# Patient Record
Sex: Female | Born: 1960
Health system: Southern US, Community
[De-identification: ages and names within clinical notes are randomized; demographics above are authoritative.]

## PROBLEM LIST (undated history)

## (undated) DIAGNOSIS — R519 Headache, unspecified: Secondary | ICD-10-CM

## (undated) DIAGNOSIS — K219 Gastro-esophageal reflux disease without esophagitis: Secondary | ICD-10-CM

## (undated) DIAGNOSIS — I10 Essential (primary) hypertension: Secondary | ICD-10-CM

## (undated) DIAGNOSIS — R51 Headache: Secondary | ICD-10-CM

## (undated) DIAGNOSIS — J449 Chronic obstructive pulmonary disease, unspecified: Secondary | ICD-10-CM

## (undated) DIAGNOSIS — E785 Hyperlipidemia, unspecified: Secondary | ICD-10-CM

## (undated) DIAGNOSIS — F32A Depression, unspecified: Secondary | ICD-10-CM

## (undated) DIAGNOSIS — E039 Hypothyroidism, unspecified: Secondary | ICD-10-CM

## (undated) DIAGNOSIS — F419 Anxiety disorder, unspecified: Secondary | ICD-10-CM

## (undated) DIAGNOSIS — F329 Major depressive disorder, single episode, unspecified: Secondary | ICD-10-CM

## (undated) DIAGNOSIS — I8393 Asymptomatic varicose veins of bilateral lower extremities: Secondary | ICD-10-CM

## (undated) DIAGNOSIS — M8580 Other specified disorders of bone density and structure, unspecified site: Secondary | ICD-10-CM

## (undated) DIAGNOSIS — G8929 Other chronic pain: Secondary | ICD-10-CM

## (undated) DIAGNOSIS — R569 Unspecified convulsions: Secondary | ICD-10-CM

## (undated) HISTORY — PX: OTHER SURGICAL HISTORY: SHX169

## (undated) HISTORY — DX: Essential (primary) hypertension: I10

## (undated) HISTORY — PX: ABDOMINAL HYSTERECTOMY: SHX81

## (undated) HISTORY — DX: Hypothyroidism, unspecified: E03.9

## (undated) HISTORY — DX: Hyperlipidemia, unspecified: E78.5

## (undated) HISTORY — DX: Asymptomatic varicose veins of bilateral lower extremities: I83.93

## (undated) HISTORY — PX: TOTAL ABDOMINAL HYSTERECTOMY: SHX209

## (undated) HISTORY — DX: Gastro-esophageal reflux disease without esophagitis: K21.9

## (undated) HISTORY — DX: Other specified disorders of bone density and structure, unspecified site: M85.80

---

## 2003-01-06 ENCOUNTER — Ambulatory Visit (HOSPITAL_BASED_OUTPATIENT_CLINIC_OR_DEPARTMENT_OTHER): Admission: RE | Admit: 2003-01-06 | Discharge: 2003-01-06 | Payer: Self-pay | Admitting: Orthopedic Surgery

## 2003-01-29 ENCOUNTER — Ambulatory Visit (HOSPITAL_BASED_OUTPATIENT_CLINIC_OR_DEPARTMENT_OTHER): Admission: RE | Admit: 2003-01-29 | Discharge: 2003-01-29 | Payer: Self-pay | Admitting: Orthopedic Surgery

## 2003-08-03 ENCOUNTER — Ambulatory Visit (HOSPITAL_COMMUNITY): Admission: RE | Admit: 2003-08-03 | Discharge: 2003-08-03 | Payer: Self-pay | Admitting: Internal Medicine

## 2003-08-03 HISTORY — PX: ESOPHAGOGASTRODUODENOSCOPY: SHX1529

## 2003-08-03 HISTORY — PX: COLONOSCOPY: SHX174

## 2003-09-29 ENCOUNTER — Ambulatory Visit (HOSPITAL_COMMUNITY): Admission: RE | Admit: 2003-09-29 | Discharge: 2003-09-29 | Payer: Self-pay | Admitting: Unknown Physician Specialty

## 2003-11-19 ENCOUNTER — Ambulatory Visit (HOSPITAL_COMMUNITY): Admission: RE | Admit: 2003-11-19 | Discharge: 2003-11-19 | Payer: Self-pay | Admitting: Unknown Physician Specialty

## 2004-05-01 ENCOUNTER — Ambulatory Visit (HOSPITAL_COMMUNITY): Admission: RE | Admit: 2004-05-01 | Discharge: 2004-05-01 | Payer: Self-pay | Admitting: Unknown Physician Specialty

## 2004-05-30 ENCOUNTER — Encounter: Admission: RE | Admit: 2004-05-30 | Discharge: 2004-05-30 | Payer: Self-pay | Admitting: Obstetrics and Gynecology

## 2004-07-11 ENCOUNTER — Ambulatory Visit: Payer: Self-pay | Admitting: Internal Medicine

## 2005-07-24 ENCOUNTER — Encounter: Admission: RE | Admit: 2005-07-24 | Discharge: 2005-07-24 | Payer: Self-pay | Admitting: Orthopaedic Surgery

## 2005-08-02 ENCOUNTER — Encounter: Admission: RE | Admit: 2005-08-02 | Discharge: 2005-08-02 | Payer: Self-pay | Admitting: Obstetrics and Gynecology

## 2005-09-16 ENCOUNTER — Ambulatory Visit (HOSPITAL_COMMUNITY): Admission: RE | Admit: 2005-09-16 | Discharge: 2005-09-16 | Payer: Self-pay | Admitting: Orthopaedic Surgery

## 2006-01-10 ENCOUNTER — Ambulatory Visit (HOSPITAL_COMMUNITY): Admission: RE | Admit: 2006-01-10 | Discharge: 2006-01-10 | Payer: Self-pay | Admitting: Family Medicine

## 2006-02-14 ENCOUNTER — Encounter: Admission: RE | Admit: 2006-02-14 | Discharge: 2006-02-28 | Payer: Self-pay | Admitting: Orthopaedic Surgery

## 2006-08-08 ENCOUNTER — Encounter (INDEPENDENT_AMBULATORY_CARE_PROVIDER_SITE_OTHER): Payer: Self-pay | Admitting: Specialist

## 2006-08-08 ENCOUNTER — Ambulatory Visit (HOSPITAL_COMMUNITY): Admission: RE | Admit: 2006-08-08 | Discharge: 2006-08-09 | Payer: Self-pay | Admitting: Obstetrics and Gynecology

## 2006-09-30 ENCOUNTER — Ambulatory Visit: Payer: Self-pay | Admitting: Family Medicine

## 2006-11-22 ENCOUNTER — Encounter (INDEPENDENT_AMBULATORY_CARE_PROVIDER_SITE_OTHER): Payer: Self-pay | Admitting: Family Medicine

## 2007-02-28 ENCOUNTER — Ambulatory Visit: Payer: Self-pay | Admitting: Physician Assistant

## 2007-03-21 ENCOUNTER — Ambulatory Visit: Payer: Self-pay | Admitting: Family Medicine

## 2007-03-21 DIAGNOSIS — M129 Arthropathy, unspecified: Secondary | ICD-10-CM | POA: Insufficient documentation

## 2007-03-21 DIAGNOSIS — K219 Gastro-esophageal reflux disease without esophagitis: Secondary | ICD-10-CM | POA: Insufficient documentation

## 2007-03-21 DIAGNOSIS — I10 Essential (primary) hypertension: Secondary | ICD-10-CM

## 2007-03-21 DIAGNOSIS — H269 Unspecified cataract: Secondary | ICD-10-CM

## 2007-03-21 DIAGNOSIS — F17200 Nicotine dependence, unspecified, uncomplicated: Secondary | ICD-10-CM

## 2007-03-21 DIAGNOSIS — E785 Hyperlipidemia, unspecified: Secondary | ICD-10-CM | POA: Insufficient documentation

## 2007-03-21 DIAGNOSIS — E669 Obesity, unspecified: Secondary | ICD-10-CM | POA: Insufficient documentation

## 2007-03-21 LAB — CONVERTED CEMR LAB
Cholesterol, target level: 200 mg/dL
HDL goal, serum: 40 mg/dL
LDL Goal: 160 mg/dL

## 2007-03-28 ENCOUNTER — Ambulatory Visit (HOSPITAL_COMMUNITY): Admission: RE | Admit: 2007-03-28 | Discharge: 2007-03-28 | Payer: Self-pay | Admitting: Family Medicine

## 2007-03-28 ENCOUNTER — Ambulatory Visit: Payer: Self-pay | Admitting: Family Medicine

## 2007-03-28 ENCOUNTER — Telehealth (INDEPENDENT_AMBULATORY_CARE_PROVIDER_SITE_OTHER): Payer: Self-pay | Admitting: *Deleted

## 2007-03-28 DIAGNOSIS — D751 Secondary polycythemia: Secondary | ICD-10-CM | POA: Insufficient documentation

## 2007-03-28 DIAGNOSIS — F329 Major depressive disorder, single episode, unspecified: Secondary | ICD-10-CM

## 2007-03-28 DIAGNOSIS — F32A Depression, unspecified: Secondary | ICD-10-CM | POA: Insufficient documentation

## 2007-03-31 ENCOUNTER — Telehealth (INDEPENDENT_AMBULATORY_CARE_PROVIDER_SITE_OTHER): Payer: Self-pay | Admitting: *Deleted

## 2007-03-31 ENCOUNTER — Encounter (INDEPENDENT_AMBULATORY_CARE_PROVIDER_SITE_OTHER): Payer: Self-pay | Admitting: Family Medicine

## 2007-04-03 ENCOUNTER — Encounter (INDEPENDENT_AMBULATORY_CARE_PROVIDER_SITE_OTHER): Payer: Self-pay | Admitting: Family Medicine

## 2007-04-04 ENCOUNTER — Telehealth (INDEPENDENT_AMBULATORY_CARE_PROVIDER_SITE_OTHER): Payer: Self-pay | Admitting: Family Medicine

## 2007-04-14 ENCOUNTER — Ambulatory Visit: Payer: Self-pay | Admitting: Family Medicine

## 2007-04-15 ENCOUNTER — Encounter (INDEPENDENT_AMBULATORY_CARE_PROVIDER_SITE_OTHER): Payer: Self-pay | Admitting: Family Medicine

## 2007-04-25 ENCOUNTER — Encounter (INDEPENDENT_AMBULATORY_CARE_PROVIDER_SITE_OTHER): Payer: Self-pay | Admitting: Family Medicine

## 2007-05-09 ENCOUNTER — Encounter (INDEPENDENT_AMBULATORY_CARE_PROVIDER_SITE_OTHER): Payer: Self-pay | Admitting: Family Medicine

## 2007-05-20 ENCOUNTER — Encounter (INDEPENDENT_AMBULATORY_CARE_PROVIDER_SITE_OTHER): Payer: Self-pay | Admitting: Family Medicine

## 2007-05-22 ENCOUNTER — Encounter (INDEPENDENT_AMBULATORY_CARE_PROVIDER_SITE_OTHER): Payer: Self-pay | Admitting: Family Medicine

## 2007-05-23 ENCOUNTER — Encounter (INDEPENDENT_AMBULATORY_CARE_PROVIDER_SITE_OTHER): Payer: Self-pay | Admitting: Family Medicine

## 2007-05-23 ENCOUNTER — Ambulatory Visit (HOSPITAL_COMMUNITY): Admission: RE | Admit: 2007-05-23 | Discharge: 2007-05-23 | Payer: Self-pay | Admitting: Family Medicine

## 2007-06-09 ENCOUNTER — Telehealth (INDEPENDENT_AMBULATORY_CARE_PROVIDER_SITE_OTHER): Payer: Self-pay | Admitting: Family Medicine

## 2007-06-10 ENCOUNTER — Ambulatory Visit: Payer: Self-pay | Admitting: Family Medicine

## 2007-06-10 LAB — CONVERTED CEMR LAB: Rapid Strep: NEGATIVE

## 2007-06-16 ENCOUNTER — Encounter (INDEPENDENT_AMBULATORY_CARE_PROVIDER_SITE_OTHER): Payer: Self-pay | Admitting: Family Medicine

## 2007-06-25 ENCOUNTER — Ambulatory Visit (HOSPITAL_COMMUNITY): Admission: RE | Admit: 2007-06-25 | Discharge: 2007-06-25 | Payer: Self-pay | Admitting: Pulmonary Disease

## 2007-10-10 ENCOUNTER — Ambulatory Visit: Payer: Self-pay | Admitting: Family Medicine

## 2007-10-10 DIAGNOSIS — J984 Other disorders of lung: Secondary | ICD-10-CM | POA: Insufficient documentation

## 2007-10-12 ENCOUNTER — Emergency Department (HOSPITAL_COMMUNITY): Admission: EM | Admit: 2007-10-12 | Discharge: 2007-10-12 | Payer: Self-pay | Admitting: Family Medicine

## 2007-10-13 ENCOUNTER — Encounter (INDEPENDENT_AMBULATORY_CARE_PROVIDER_SITE_OTHER): Payer: Self-pay | Admitting: Family Medicine

## 2007-10-13 ENCOUNTER — Telehealth (INDEPENDENT_AMBULATORY_CARE_PROVIDER_SITE_OTHER): Payer: Self-pay | Admitting: *Deleted

## 2007-12-03 ENCOUNTER — Encounter (INDEPENDENT_AMBULATORY_CARE_PROVIDER_SITE_OTHER): Payer: Self-pay | Admitting: *Deleted

## 2007-12-03 ENCOUNTER — Ambulatory Visit: Payer: Self-pay | Admitting: Family Medicine

## 2007-12-03 ENCOUNTER — Ambulatory Visit (HOSPITAL_COMMUNITY): Admission: RE | Admit: 2007-12-03 | Discharge: 2007-12-03 | Payer: Self-pay | Admitting: Family Medicine

## 2007-12-04 ENCOUNTER — Telehealth (INDEPENDENT_AMBULATORY_CARE_PROVIDER_SITE_OTHER): Payer: Self-pay | Admitting: *Deleted

## 2007-12-15 ENCOUNTER — Encounter (INDEPENDENT_AMBULATORY_CARE_PROVIDER_SITE_OTHER): Payer: Self-pay | Admitting: Family Medicine

## 2008-01-12 ENCOUNTER — Telehealth (INDEPENDENT_AMBULATORY_CARE_PROVIDER_SITE_OTHER): Payer: Self-pay | Admitting: *Deleted

## 2008-01-13 ENCOUNTER — Ambulatory Visit: Payer: Self-pay | Admitting: Family Medicine

## 2008-01-13 DIAGNOSIS — J301 Allergic rhinitis due to pollen: Secondary | ICD-10-CM

## 2008-01-16 ENCOUNTER — Telehealth (INDEPENDENT_AMBULATORY_CARE_PROVIDER_SITE_OTHER): Payer: Self-pay | Admitting: *Deleted

## 2008-01-21 ENCOUNTER — Telehealth (INDEPENDENT_AMBULATORY_CARE_PROVIDER_SITE_OTHER): Payer: Self-pay | Admitting: *Deleted

## 2008-01-21 ENCOUNTER — Ambulatory Visit: Payer: Self-pay | Admitting: Family Medicine

## 2008-02-10 ENCOUNTER — Ambulatory Visit: Payer: Self-pay | Admitting: Family Medicine

## 2008-02-10 LAB — CONVERTED CEMR LAB
Bilirubin Urine: NEGATIVE
Blood in Urine, dipstick: NEGATIVE
Glucose, Urine, Semiquant: NEGATIVE
Ketones, urine, test strip: NEGATIVE
Nitrite: NEGATIVE
Protein, U semiquant: NEGATIVE
Specific Gravity, Urine: 1.015
Urobilinogen, UA: 0.2
WBC Urine, dipstick: NEGATIVE
pH: 6

## 2008-07-21 ENCOUNTER — Ambulatory Visit: Payer: Self-pay | Admitting: Internal Medicine

## 2008-07-22 ENCOUNTER — Telehealth (INDEPENDENT_AMBULATORY_CARE_PROVIDER_SITE_OTHER): Payer: Self-pay | Admitting: Internal Medicine

## 2008-07-23 ENCOUNTER — Ambulatory Visit: Payer: Self-pay | Admitting: Internal Medicine

## 2008-08-12 ENCOUNTER — Encounter (INDEPENDENT_AMBULATORY_CARE_PROVIDER_SITE_OTHER): Payer: Self-pay | Admitting: Family Medicine

## 2008-08-13 ENCOUNTER — Ambulatory Visit: Payer: Self-pay | Admitting: Internal Medicine

## 2008-08-13 ENCOUNTER — Ambulatory Visit: Payer: Self-pay | Admitting: Family Medicine

## 2008-08-13 DIAGNOSIS — R7309 Other abnormal glucose: Secondary | ICD-10-CM

## 2008-08-13 DIAGNOSIS — E039 Hypothyroidism, unspecified: Secondary | ICD-10-CM

## 2008-08-13 LAB — CONVERTED CEMR LAB
ALT: 28 units/L (ref 0–35)
AST: 19 units/L (ref 0–37)
Albumin: 4.7 g/dL (ref 3.5–5.2)
Alkaline Phosphatase: 93 units/L (ref 39–117)
BUN: 10 mg/dL (ref 6–23)
Basophils Absolute: 0 10*3/uL (ref 0.0–0.1)
Basophils Relative: 0 % (ref 0–1)
CO2: 26 meq/L (ref 19–32)
Calcium: 9.5 mg/dL (ref 8.4–10.5)
Chloride: 98 meq/L (ref 96–112)
Cholesterol: 205 mg/dL — ABNORMAL HIGH (ref 0–200)
Creatinine, Ser: 0.71 mg/dL (ref 0.40–1.20)
Eosinophils Absolute: 0.2 10*3/uL (ref 0.0–0.7)
Eosinophils Relative: 2 % (ref 0–5)
Glucose, Bld: 118 mg/dL — ABNORMAL HIGH (ref 70–99)
Glucose, Bld: 124 mg/dL
HCT: 47.8 % — ABNORMAL HIGH (ref 36.0–46.0)
HDL: 54 mg/dL (ref >39)
Hemoglobin: 16 g/dL — ABNORMAL HIGH (ref 12.0–15.0)
Hgb A1c MFr Bld: 6.1 %
LDL Cholesterol: 132 mg/dL — ABNORMAL HIGH (ref 0–99)
Lymphocytes Relative: 23 % (ref 12–46)
Lymphs Abs: 2.3 10*3/uL (ref 0.7–4.0)
MCHC: 33.5 g/dL (ref 30.0–36.0)
MCV: 99 fL (ref 78.0–100.0)
Monocytes Absolute: 0.6 10*3/uL (ref 0.1–1.0)
Monocytes Relative: 6 % (ref 3–12)
Neutro Abs: 6.6 10*3/uL (ref 1.7–7.7)
Neutrophils Relative %: 68 % (ref 43–77)
Platelets: 291 10*3/uL (ref 150–400)
Potassium: 3.4 meq/L — ABNORMAL LOW (ref 3.5–5.3)
RBC: 4.83 M/uL (ref 3.87–5.11)
RDW: 14.2 % (ref 11.5–15.5)
Sodium: 139 meq/L (ref 135–145)
TSH: 4.976 microintl units/mL — ABNORMAL HIGH (ref 0.350–4.50)
Total Bilirubin: 0.6 mg/dL (ref 0.3–1.2)
Total CHOL/HDL Ratio: 3.8
Total Protein: 7.4 g/dL (ref 6.0–8.3)
Triglycerides: 97 mg/dL (ref <150)
VLDL: 19 mg/dL (ref 0–40)
WBC: 9.6 10*3/uL (ref 4.0–10.5)

## 2008-08-30 ENCOUNTER — Ambulatory Visit (HOSPITAL_COMMUNITY): Admission: RE | Admit: 2008-08-30 | Discharge: 2008-08-30 | Payer: Self-pay | Admitting: Internal Medicine

## 2008-08-30 ENCOUNTER — Encounter: Payer: Self-pay | Admitting: Internal Medicine

## 2008-08-30 ENCOUNTER — Ambulatory Visit: Payer: Self-pay | Admitting: Internal Medicine

## 2008-08-30 HISTORY — PX: COLONOSCOPY: SHX174

## 2008-09-24 ENCOUNTER — Ambulatory Visit: Payer: Self-pay | Admitting: Family Medicine

## 2008-09-24 LAB — CONVERTED CEMR LAB
Blood Glucose, Fasting: 121 mg/dL
Hgb A1c MFr Bld: 6.1 %

## 2008-09-25 ENCOUNTER — Encounter (INDEPENDENT_AMBULATORY_CARE_PROVIDER_SITE_OTHER): Payer: Self-pay | Admitting: Family Medicine

## 2008-09-28 LAB — CONVERTED CEMR LAB
ALT: 23 units/L (ref 0–35)
AST: 15 units/L (ref 0–37)
Albumin: 4 g/dL (ref 3.5–5.2)
Alkaline Phosphatase: 87 units/L (ref 39–117)
BUN: 6 mg/dL (ref 6–23)
CO2: 19 meq/L (ref 19–32)
Calcium: 8.7 mg/dL (ref 8.4–10.5)
Chloride: 104 meq/L (ref 96–112)
Creatinine, Ser: 0.67 mg/dL (ref 0.40–1.20)
Glucose, Bld: 133 mg/dL — ABNORMAL HIGH (ref 70–99)
Potassium: 3.5 meq/L (ref 3.5–5.3)
Sodium: 139 meq/L (ref 135–145)
TSH: 4.432 microintl units/mL (ref 0.350–4.50)
Total Bilirubin: 0.3 mg/dL (ref 0.3–1.2)
Total Protein: 6.7 g/dL (ref 6.0–8.3)

## 2008-11-17 ENCOUNTER — Telehealth (INDEPENDENT_AMBULATORY_CARE_PROVIDER_SITE_OTHER): Payer: Self-pay | Admitting: Family Medicine

## 2009-02-03 ENCOUNTER — Telehealth (INDEPENDENT_AMBULATORY_CARE_PROVIDER_SITE_OTHER): Payer: Self-pay | Admitting: *Deleted

## 2009-02-08 ENCOUNTER — Telehealth (INDEPENDENT_AMBULATORY_CARE_PROVIDER_SITE_OTHER): Payer: Self-pay | Admitting: Family Medicine

## 2009-02-25 ENCOUNTER — Ambulatory Visit: Payer: Self-pay | Admitting: Family Medicine

## 2009-02-25 LAB — CONVERTED CEMR LAB
Bilirubin Urine: NEGATIVE
Blood in Urine, dipstick: NEGATIVE
Glucose, Bld: 86 mg/dL
Glucose, Urine, Semiquant: NEGATIVE
Hgb A1c MFr Bld: 6.1 %
Ketones, urine, test strip: NEGATIVE
Nitrite: NEGATIVE
Protein, U semiquant: NEGATIVE
Specific Gravity, Urine: 1.01
Urobilinogen, UA: 0.2
WBC Urine, dipstick: NEGATIVE
pH: 6.5

## 2009-02-26 ENCOUNTER — Encounter (INDEPENDENT_AMBULATORY_CARE_PROVIDER_SITE_OTHER): Payer: Self-pay | Admitting: Family Medicine

## 2009-02-28 LAB — CONVERTED CEMR LAB
ALT: 31 units/L (ref 0–35)
AST: 26 units/L (ref 0–37)
Albumin: 4.3 g/dL (ref 3.5–5.2)
Alkaline Phosphatase: 87 units/L (ref 39–117)
BUN: 8 mg/dL (ref 6–23)
Basophils Absolute: 0 10*3/uL (ref 0.0–0.1)
Basophils Relative: 0 % (ref 0–1)
CO2: 22 meq/L (ref 19–32)
Calcium: 8.9 mg/dL (ref 8.4–10.5)
Chloride: 105 meq/L (ref 96–112)
Cholesterol: 181 mg/dL (ref 0–200)
Creatinine, Ser: 0.63 mg/dL (ref 0.40–1.20)
Eosinophils Absolute: 0.2 10*3/uL (ref 0.0–0.7)
Eosinophils Relative: 3 % (ref 0–5)
Glucose, Bld: 117 mg/dL — ABNORMAL HIGH (ref 70–99)
HCT: 41.2 % (ref 36.0–46.0)
HDL: 40 mg/dL (ref >39)
Hemoglobin: 13.6 g/dL (ref 12.0–15.0)
LDL Cholesterol: 106 mg/dL — ABNORMAL HIGH (ref 0–99)
Lymphocytes Relative: 26 % (ref 12–46)
Lymphs Abs: 2.3 10*3/uL (ref 0.7–4.0)
MCHC: 33 g/dL (ref 30.0–36.0)
MCV: 95.4 fL (ref 78.0–100.0)
Monocytes Absolute: 0.5 10*3/uL (ref 0.1–1.0)
Monocytes Relative: 5 % (ref 3–12)
Neutro Abs: 5.8 10*3/uL (ref 1.7–7.7)
Neutrophils Relative %: 66 % (ref 43–77)
Platelets: 290 10*3/uL (ref 150–400)
Potassium: 3.9 meq/L (ref 3.5–5.3)
RBC: 4.32 M/uL (ref 3.87–5.11)
RDW: 14.4 % (ref 11.5–15.5)
Sodium: 141 meq/L (ref 135–145)
TSH: 8.233 microintl units/mL — ABNORMAL HIGH (ref 0.350–4.500)
Total Bilirubin: 0.4 mg/dL (ref 0.3–1.2)
Total CHOL/HDL Ratio: 4.5
Total Protein: 6.8 g/dL (ref 6.0–8.3)
Triglycerides: 176 mg/dL — ABNORMAL HIGH (ref <150)
VLDL: 35 mg/dL (ref 0–40)
WBC: 8.8 10*3/uL (ref 4.0–10.5)

## 2009-04-11 ENCOUNTER — Ambulatory Visit: Payer: Self-pay | Admitting: Family Medicine

## 2009-04-11 DIAGNOSIS — R0789 Other chest pain: Secondary | ICD-10-CM

## 2009-04-12 ENCOUNTER — Encounter (INDEPENDENT_AMBULATORY_CARE_PROVIDER_SITE_OTHER): Payer: Self-pay | Admitting: Family Medicine

## 2009-04-12 LAB — CONVERTED CEMR LAB: TSH: 5.361 microintl units/mL — ABNORMAL HIGH (ref 0.350–4.500)

## 2009-04-19 ENCOUNTER — Encounter (INDEPENDENT_AMBULATORY_CARE_PROVIDER_SITE_OTHER): Payer: Self-pay | Admitting: Family Medicine

## 2009-05-03 ENCOUNTER — Telehealth (INDEPENDENT_AMBULATORY_CARE_PROVIDER_SITE_OTHER): Payer: Self-pay | Admitting: *Deleted

## 2009-05-04 ENCOUNTER — Ambulatory Visit: Payer: Self-pay | Admitting: Family Medicine

## 2009-05-04 DIAGNOSIS — M25569 Pain in unspecified knee: Secondary | ICD-10-CM | POA: Insufficient documentation

## 2009-05-05 ENCOUNTER — Ambulatory Visit (HOSPITAL_COMMUNITY): Admission: RE | Admit: 2009-05-05 | Discharge: 2009-05-05 | Payer: Self-pay | Admitting: Family Medicine

## 2009-05-17 ENCOUNTER — Ambulatory Visit: Payer: Self-pay | Admitting: Family Medicine

## 2009-05-17 ENCOUNTER — Ambulatory Visit (HOSPITAL_COMMUNITY): Admission: RE | Admit: 2009-05-17 | Discharge: 2009-05-17 | Payer: Self-pay | Admitting: Family Medicine

## 2009-05-17 DIAGNOSIS — M545 Low back pain: Secondary | ICD-10-CM

## 2009-05-18 ENCOUNTER — Encounter (INDEPENDENT_AMBULATORY_CARE_PROVIDER_SITE_OTHER): Payer: Self-pay | Admitting: Family Medicine

## 2009-05-18 LAB — CONVERTED CEMR LAB: TSH: 1.853 microintl units/mL (ref 0.350–4.500)

## 2009-05-19 ENCOUNTER — Encounter (INDEPENDENT_AMBULATORY_CARE_PROVIDER_SITE_OTHER): Payer: Self-pay | Admitting: Family Medicine

## 2009-05-24 ENCOUNTER — Encounter (INDEPENDENT_AMBULATORY_CARE_PROVIDER_SITE_OTHER): Payer: Self-pay | Admitting: Family Medicine

## 2009-06-21 ENCOUNTER — Ambulatory Visit (HOSPITAL_COMMUNITY): Admission: RE | Admit: 2009-06-21 | Discharge: 2009-06-21 | Payer: Self-pay | Admitting: Orthopedic Surgery

## 2010-10-01 LAB — CONVERTED CEMR LAB: LDL Goal: 130 mg/dL

## 2011-01-16 NOTE — Procedures (Signed)
NAMESHAKOYA, GILMORE               ACCOUNT NO.:  [id number]   MEDICAL RECORD NO.:  [id number]          PATIENT TYPE:  OUT   LOCATION:  RESP                          FACILITY:  [facility]   PHYSICIAN:  Edward L. Juanetta Gosling, M.D.DATE OF BIRTH:  11-Nov-1960   DATE OF PROCEDURE:  05/23/2007  DATE OF DISCHARGE:                            PULMONARY FUNCTION TEST   FINDINGS:  1. Spirometry shows a mild ventilatory defect without evidence of      airflow obstruction.  2. Lung volumes show reduction in total lung capacity of the same      order of magnitude as the ventilatory defect suggesting a primary      restrictive process.  3. DLCO is moderately reduced.  4. Arterial blood gases are normal.  5. There is improvement with inhaled bronchodilator, but it has not      reached a level of significance.      Edward L. Juanetta Gosling, M.D.  Electronically Signed     ELH/MEDQ  D:  05/27/2007  T:  05/27/2007  Job:  [id number]

## 2011-01-16 NOTE — Assessment & Plan Note (Signed)
Elizabeth Brewer, Elizabeth Brewer                CHART#:  [id number]   DATE:  08/13/2008                       DOB:  November 30, 1960   CHIEF COMPLAINT:  Time for high-risk screening colonoscopy, history of  gastroesophageal reflux disease.   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  In 2004, she had a high-risk screening  colonoscopy given her history of colon cancer in her mother in her early  45s.  On August 03, 2003, she was found to have internal hemorrhoids,  otherwise normal rectum and colon.  She also underwent an EGD on the  same day which revealed mild erosive reflux esophagitis.  She has been  on a variety of PPIs over the years including Aciphex and Prevacid.  She  is currently on Nexium 40 mg orally daily with fairly good control of  her reflux symptoms.  She has a couple of episodes of breakthrough weak  leg.  Unfortunately, she has continued to gain weight, has gained  another 11 pounds since we last saw this nice lady in 2005.  She  significantly over her ideal body weight at 218 pounds.  She is 5 feet 4  inches tall.  She does not have any odynophagia or dysphagia.  No  melena.  No rectal bleeding.  No abdominal pain.   PAST MEDICAL HISTORY:  Significant for gastroesophageal reflux disease.   PAST SURGICAL HISTORY:  Hysterectomy, carpal tunnel x2, and C-section  x1.   CURRENT MEDICATIONS:  Nexium 40 mg orally daily, Crestor 20 mg daily,  and Lexapro 10 mg at bedtime.   ALLERGIES:  No known drug allergies.   FAMILY HISTORY:  Mother had colon cancer, but actually died with an MI.  Father died with an MI.  Otherwise, no history of chronic GI or liver  illness.   SOCIAL HISTORY:  The patient is married.  She is employed in a [employer].  She smokes a pack of cigarettes  per day.  No alcohol.  No illicit drugs.   REVIEW OF SYSTEMS:  Has not any problems with chest pain or dyspnea on  exertion.  No fever or chills.  Weight gain as outlined above.   PHYSICAL EXAMINATION:  GENERAL:  A pleasant 50 year old lady resting  comfortably.  VITAL SIGNS:  Weight 218, height 5 feet 4 inches, temperature 98, BP  120/78, and pulse 72.  CHEST:  Lungs are clear to auscultation.  CARDIAC:  Regular rate and rhythm without murmur, gallop, or rub.  ABDOMEN:  Nondistended, obese, positive bowel sounds, soft, nontender  without obvious mass or organomegaly.  EXTREMITIES:  No edema.  RECTAL:  Deferred at the time of colonoscopy.   IMPRESSION:  The patient is a pleasant 50 year old lady with a positive  family history of colorectal cancer in her mother.  It has been a little  over 5 years since she lastly had a colonoscopy.  I have offered this  lady a high-risk screening colonoscopy at this time.  Risks, benefits,  alternatives, and limitations have been reviewed; questions answered.  She is agreeable.  She has long-standing gastroesophageal reflux disease  symptoms fairly well  controlled on Nexium.  She could stand to lose a  significant amount of weight and I have discussed this approach along  with multi-prolonged treatment regimen for gastroesophageal reflux  disease.  I will make further recommendations once colonoscopy has been  performed.       Jonathon Bellows, M.D.  Electronically Signed     RMR/MEDQ  D:  08/13/2008  T:  08/14/2008  Job:  [id number]   cc:   Delaney Meigs, M.D.

## 2011-01-16 NOTE — Op Note (Signed)
NAMECARROLYN, HILMES               ACCOUNT NO.:  [id number]   MEDICAL RECORD NO.:  [id number]          PATIENT TYPE:  AMB   LOCATION:  DAY                           FACILITY:  [facility]   PHYSICIAN:  R. Roetta Sessions, M.D. DATE OF BIRTH:  [birth year]   DATE OF PROCEDURE:  08/30/2008  DATE OF DISCHARGE:                               OPERATIVE REPORT   PROCEDURE PERFORMED:  High-risk screening colonoscopy, colonoscopy and  biopsy.   INDICATIONS FOR PROCEDURE:  A 50 year old lady with a positive family  history of colon cancer in first-degree relatives at relatively young  age.  She is devoid of any lower GI tract symptoms currently.  Colonoscopy is now being done as high-risk screening.  Risks, benefits,  alternatives, and limitations have been reviewed.  Questions answered.  Please see the documentation in the medical record.   PROCEDURE NOTE:  O2 saturation, blood pressure, pulse, and respirations  monitored throughout the entire procedure.   CONSCIOUS SEDATION:  Versed 6 mg IV, Demerol 125 mg IV in divided doses,  Phenergan 12.5 mg __________ conscious sedation.   INSTRUMENT:  Pentax video chip system.   FINDINGS:  Digital rectal exam revealed no abnormalities.  Endoscopic  findings:  The prep was adequate.  Colon:  Colonic mucosa was surveyed  from the rectosigmoid junction through the left transverse, right colon,  appendiceal orifice, ileocecal valve, and cecum.  These structures were  well seen and photographed for the record.  From this level, the scope  was slowly and cautiously withdrawn.  All previously mentioned mucosal  surfaces were again seen.  The patient had few scattered sigmoid  diverticula in the __________ cold biopsied/removed.  The remainder of  the colonic mucosa appeared normal.  The scope was pulled down to the  rectum with examination of rectal mucosa including retroflexed view of  the anal verge, demonstrating minimal internal hemorrhoids __________  polyp  at 10 cm.  This polyp was also cold biopsied and removed.  The  patient tolerated the procedure well and was reacted in Endoscopy.   IMPRESSION:  1. Minimal internal hemorrhoids, single dimunitive polyp status post      cold biopsy/removal.  The remainder of the rectal mucosa appeared      normal.  2. Left-sided diverticula, dimunitive with hepatic flexure polyp      status post cold biopsy/removal.  Colonic mucosa appeared normal.   RECOMMENDATIONS:  1. Diverticulitis and polyp literature provided to Ms. Dirico.  2. Followup on path.  3. Further recommendations to follow.      Jonathon Bellows, M.D.  Electronically Signed     RMR/MEDQ  D:  08/30/2008  T:  08/31/2008  Job:  [id number]   cc:   Delaney Meigs, M.D.  Fax: [phone]

## 2011-01-19 NOTE — Op Note (Signed)
Elizabeth Brewer, Elizabeth Brewer                         ACCOUNT NO.:  [id number]   MEDICAL RECORD NO.:  [id number]                   PATIENT TYPE:  AMB   LOCATION:  DAY                                  FACILITY:  [facility]   PHYSICIAN:  Elizabeth Brewer, M.D.              DATE OF BIRTH:  24-Mar-1961   DATE OF PROCEDURE:  08/03/2003  DATE OF DISCHARGE:                                 OPERATIVE REPORT   PROCEDURE:  Diagnostic esophagogastroduodenoscopy followed by colonoscopy.   ENDOSCOPIST:  Elizabeth Brewer, M.D.   INDICATIONS FOR PROCEDURE:  The patient is a 50 year old lady with  intermittent rectal bleeding in the setting of positive family history of  Elizabeth cancer.  She also has had longstanding gastroesophageal reflux  symptoms sometimes refractory to Prevacid.  No alarm symptoms.  EGD and  colonoscopy are now being performed. This approach has been discussed with  the patient previously.  The potential risks, benefits, and alternatives  have been reviewed, questions answered.  She is agreeable.  Please see my  dictated consultation note of July 21, 2003 for more information.   PROCEDURE NOTE:  O2 saturation, blood pressure, pulse and respirations were  monitored throughout the entire procedures.  Conscious sedation: Demerol 150  mg IV, Versed 10 mg IV in divided doses.   INSTRUMENT:  Olympus video chip adult gastroscope and colonoscope.   FINDINGS:  Examination of the tubular esophagus revealed a couple of distal  esophageal erosions.  They were small.  There was no evidence of Barrett's  esophagus, or other abnormality.  The EG junction was easily traversed.   STOMACH:  The gastric cavity was empty.  It insufflated well with air.  A  thorough examination of the gastric mucosa including a retroflex view of the  proximal stomach and esophagogastric junction demonstrated no abnormalities.  The pylorus was patent and easily traversed.   DUODENUM:  The bulb and the second portion  appeared normal.   THERAPEUTIC/DIAGNOSTIC MANEUVERS:  None.   The patient tolerated the procedure well and was prepared for colonoscopy.   FINDINGS:  A digital rectal exam revealed no abnormalities.   ENDOSCOPIC FINDINGS:  The prep was good.   RECTUM:  Examination of the rectal mucosa including a retroflex view of the  anal verge revealed only internal hemorrhoids.   Elizabeth:  The colonic mucosa was surveyed from the rectosigmoid junction  through the left transverse and right Elizabeth to the area of the appendiceal  orifice, ileocecal valve, and cecum.  These structures were well seen and  photographed for the record.  The colonic mucosa all the way to the cecum  appeared normal.   From the level of the cecum and ileocecal valve the scope was slowly  withdrawn.  All previously mentioned mucosal surfaces were again seen; and,  again, no abnormalities were observed aside from internal hemorrhoids.  The  patient tolerated both procedures well and was reacted  in endoscopy.   EGD IMPRESSION:  1. A couple of tiny erosions consistent with mild, erosive reflux     esophagitis; otherwise normal esophageal mucosa.  2. Normal stomach.  3. Normal D1 and D2.   COLONOSCOPY FINDINGS:  Internal hemorrhoids, otherwise normal rectum, normal  Elizabeth.   I suspect that the patient bled from hemorrhoids.   RECOMMENDATIONS:  1. Antireflux literature provided to Elizabeth Brewer. She is to stop smoking.  2. Stop Prevacid and begin on Nexium 40 mg orally each morning before     breakfast.  She is to go by my office for 30 samples.  3. Hemorrhoid literature provided to Elizabeth Brewer. A 10-day course of Anusol     HC suppositories, 1 per rectum at bedtime  4. Repeat colonoscopy for high-risk screening purposes in 5 years.  5. Follow up appointment with me to assess her progress in 1 month.      ___________________________________________                                            Elizabeth Brewer, M.D.    RMR/MEDQ  D:  08/03/2003  T:  08/03/2003  Job:  [id number]   cc:   Elizabeth Flattery, MD  [address]  [place]  [place] [address]  Fax: [phone]

## 2011-01-19 NOTE — Op Note (Signed)
NAMEGLENNIE, BOSE NO.:  [id number]   MEDICAL RECORD NO.:  [id number]          PATIENT TYPE:  AMB   LOCATION:  SDC                           FACILITY:  [facility]   PHYSICIAN:  Malachi Pro. Ambrose Mantle, M.D. DATE OF BIRTH:  [birth year]   DATE OF PROCEDURE:  08/08/2006  DATE OF DISCHARGE:                               OPERATIVE REPORT   PREOPERATIVE DIAGNOSIS:  Menorrhagia, dysmenorrhea, probable  adenomyosis.   POSTOPERATIVE DIAGNOSIS:  Menorrhagia, dysmenorrhea, probable  adenomyosis.   OPERATION:  Abdominal hysterectomy, bilateral salpingo-oophorectomy.   OPERATOR:  [place].   ASSISTANT:  Bovard.   General anesthesia.   The patient was brought to the operating room and placed under  satisfactory general anesthesia.  She was placed in frog-leg position.  The abdomen, vulva, vagina and urethra were prepped with Betadine  solution.  A Foley catheter was inserted to straight drain.  The patient  was placed supine, and the abdomen was draped as a sterile field.  The  patient's previous C-section had been made through a transverse incision  suprapubically; however, with the time, the incision had actually  dropped down to just over the pubic bone, so I made an incision higher  than that transversely through the skin, subcutaneous tissue and fascia.  The fascia was then separated from the rectus muscles superiorly and  inferiorly.  The rectus muscle was already slightly split in the  midline.  I entered the peritoneal cavity bluntly and then dissected the  muscles apart.  I palpated the upper abdomen.  The liver was smooth.  The gallbladder was smooth.  There were some adhesions around the  gallbladder.  Both kidneys felt normal, although I could not feel the  full extent of either kidney.  There were no upper abdominal  abnormalities noted.  Exploration of the pelvis revealed the uterus to  be normal size.  Both tubes and ovaries felt normal.  The cul-de-sacs  were free  of disease.  A flexible retractor was used, and then some wet  laps were used to provide better exposure.  The uterus was elevated with  Kelly clamps across both upper pedicles.  The round ligament on the  right was bovied, and bleeding was encountered.  This was controlled.  I  then divided the right infundibulopelvic ligament between two clamps,  cut between the clamps, doubly suture ligated the upper pedicle the  infundibulopelvic ligament.  I then removed the right tube and ovary for  better visualization, dissected down to the uterine vessels.  Then  repeated the procedure on the left side.  I could not attack the left  round ligament until I had taken the left infundibulopelvic ligament.  I  doubly ligated the left infundibulopelvic ligament after dividing  between clamps.  I then exposed the left round ligament, divided it  between clamps and suture ligated it.  I created a bladder flap, pushed  the bladder inferiorly and then was able to clamp, cut and suture ligate  the uterine vessels, parametria and paracervical tissues.  I separately  divided the uterosacral ligaments and held them.  I then entered the  right vaginal angle and removed the uterus by transecting the upper  vagina.  A left vaginal angle suture was created.  The right vaginal  angle was already created.  I then closed the vaginal mucosa with  interrupted figure-of-eight sutures of 0 Vicryl.  Liberal irrigation  confirmed we still had bleeding along the right broad ligament.  This  took several sutures to control.  Finally bleeding came under control.  I sutured the uterosacral ligaments together in the midline,  reperitonealized over the vaginal cuff, again confirmed hemostasis,  removed the retractor and four packs and closed the abdominal wall by  suturing the rectus muscles together in the midline with the peritoneum  from the right side; I could not locate the peritoneum on the left side.  I then closed the  fascia with 2 running sutures of 0 Vicryl, the subcu  with a running 3-0 Vicryl and the  skin was closed with automatic staples.  The patient seemed to tolerate  the procedure well.  Blood loss was thought to be about 150 mL.  Sponge  and needle counts were correct.  Urine was clear at the end of the  procedure, and the patient was returned to recovery in satisfactory  condition.      Malachi Pro. Ambrose Mantle, M.D.  Electronically Signed     TFH/MEDQ  D:  08/08/2006  T:  08/08/2006  Job:  [id number]

## 2011-01-19 NOTE — Op Note (Signed)
   Elizabeth Brewer, Elizabeth Brewer                         ACCOUNT NO.:  [id number]   MEDICAL RECORD NO.:  [id number]                   PATIENT TYPE:  AMB   LOCATION:  DSC                                  FACILITY:  [facility]   PHYSICIAN:  Cindee Salt, M.D.                    DATE OF BIRTH:  [birth year]   DATE OF PROCEDURE:  01/29/2003  DATE OF DISCHARGE:                                 OPERATIVE REPORT   PREOPERATIVE DIAGNOSIS:  Carpal tunnel syndrome, left hand.   POSTOPERATIVE DIAGNOSIS:  Carpal tunnel syndrome, left hand.   OPERATION:  Decompression left median nerve.   SURGEON:  Cindee Salt, M.D.   ASSISTANT:  None.   ANESTHESIA:  Forearm based IV regional.   INDICATIONS FOR PROCEDURE:  The patient is a 50 year old female with a  history of bilateral carpal tunnel syndrome. She has undergone release of  her right. She is admitted now for release of the left side.   DESCRIPTION OF PROCEDURE:  The patient was brought to the operating room  where a forearm based IV regional anesthetic was carried out without  difficulty. She was prepped and draped using Duraprep. She was placed in the  supine position with the left arm free.   A longitudinal incision was made in the palm and was carried down through  the subcutaneous tissues. Bleeders were electrocauterized. The palmar fascia  was split. The superficial palmar arch was identified. The flexor tendon to  the ring and middle finger were identified.   To the ulnar side of the median nerve the carpal retinaculum was incised  with sharp dissection. A right-angle and Sewell retractor were placed  between the skin and the forearm fascia. The fascia was released for  approximately 3 cm proximal  to the wrist crease under direct vision.   The canal was explored. No further lesions were identified. The median nerve  was noted to be compressed. The retinaculum was extremely tight. The wound  was irrigated. The skin was closed with interrupted 5-0  nylon suture. A  sterile compressive dressing  and a splint were applied.   The patient tolerated the procedure well. She was taken to the recovery room  for observation in satisfactory condition. She is discharged to home to  return to the [facility] of [place] in one week on Vicodin  and Keflex.                                               Cindee Salt, M.D.    Angelique Blonder  D:  01/29/2003  T:  01/29/2003  Job:  [id number]

## 2011-01-19 NOTE — H&P (Signed)
NAMESERRIA, SLOMA NO.:  [id number]   MEDICAL RECORD NO.:  [id number]          PATIENT TYPE:  AMB   LOCATION:  SDC                           [facility]   PHYSICIAN:  Malachi Pro. Ambrose Mantle, M.D. DATE OF BIRTH:  [birth year]   DATE OF ADMISSION:  08/08/2006  DATE OF DISCHARGE:                              HISTORY & PHYSICAL   PRESENT ILLNESS:  A 50 year old, white, married female, para 1-0-1-1,  who is admitted to the hospital for abdominal hysterectomy and bilateral  salpingo-oophorectomy because of severe dysmenorrhea and severe  menorrhagia, probable adenomyosis, and a history of ovarian cyst.  This  patient was seen in our office on Jan 10, 2006.  She had complained of  heavy bleeding and severe dysmenorrhea before.  She reported that she  changed her clothes several times per day during her menstrual periods.  She would use up to 10 pads a day.  She considered the pain with her  periods to be extremely severe.  She felt that her periods significantly  impacted on her quality of life.  She was noted to have a 4 x 4-cm  cystic mass in the left ovary, and she was advised to return in June for  followup examination.  At that time the pelvic mass had resolved.  Again, she complained that the periods were unnecessarily heavy and  severely painful.  Because of an atypical Pap smear, a thin prep or high  risk HPV was done and HPV was not found.  The Pap smear was normal.  The  patient requested to proceed with a hysterectomy.  She reports that her  sex drive is zero, that she could be happy not having any sex, and so  she does not fear any impact on her sex drive with removal of the  ovaries.  She feels that she would prefer to proceed with bilateral  salpingo-oophorectomy at the time of hysterectomy rather than risk the  occurrence of ovarian cancer or other benign conditions of the ovary  that might require future surgery.  I have examined her very carefully.  She  has had one C-section, never had a baby vaginally and her vagina is  quite tight and I do not wish to perform the surgery vaginally.   PAST MEDICAL HISTORY:  1. REVEALS NO KNOWN DRUG ALLERGIES.  NO LATEX ALLERGY.  2. She has had no serious illnesses but she does have GERD.   OPERATIONS:  1. C-section.  2. Carpal tunnel in both hands.  3. She has recently had some moles removed from her neck.   REVIEW OF SYSTEMS:  Noncontributory.  She is employed and works at  [employer].  She does not drink.  She smokes one pack of  cigarettes per day.   FAMILY HISTORY:  Mother died.  She was 28 years old, heart attack and  cancer of the colon.  Father died at 71 of a heart attack.  Two brothers  67 and 91 and a sister 68 are living and well.   The patient's takes Nexium for her GERD.  PHYSICAL EXAMINATION:  GENERAL:  Well developed, somewhat obese, white  female in no acute distress.  VITAL SIGNS:  Weight is 203 pounds, pulse 80, blood pressure 144/88.  HEENT:  Reveal no cranial abnormalities.  There are absent upper molars  on both sides.  NECK:  Supple without thyromegaly.  BREASTS:  Soft without masses.  They are quite large.  HEART:  Normal in size.  Normal sounds.  LUNGS:  Clear to auscultation.  ABDOMEN:  Soft and nontender.  No masses are palpable.  Liver spleen and  kidneys are not felt.  PELVIC:  Pap smear on February 12, 2006, was normal with negative high risk  HPV.  The vulva and vagina are clean.  Cervix is clean.  The uterus is  mid plane, normal size.  The vagina is quite tight.  Adnexa are free of  masses.  RECTAL:  In May 2007, was negative.   ADMITTING IMPRESSION:  Severe dysmenorrhea and menorrhagia, probable  adenomyosis.   The patient has undergone ultrasound and shows no lesions in the  endometrial cavity and no known fibroids.  The patient is admitted for  abdominal hysterectomy and bilateral salpingo-oophorectomy.  She  understands the risks of surgery  include, but are not limited, to heart  attack, stroke, pulmonary embolus, wound disruption, hemorrhage with  need for reoperation and/or transfusion, fistula formation, nerve  injury, and intestinal obstruction.  She also understands the risk of  infection.  She also has been informed that the surgery might have an  unpredictable impact on her sex drive but since she has not sex drive,  she says that it could not have a negative impact.  She is ready to  proceed.      Malachi Pro. Ambrose Mantle, M.D.  Electronically Signed     TFH/MEDQ  D:  08/07/2006  T:  08/07/2006  Job:  [id number]

## 2011-01-19 NOTE — Op Note (Signed)
   NAMEKEAGHAN, BOWENS                         ACCOUNT NO.:  [id number]   MEDICAL RECORD NO.:  [id number]                   PATIENT TYPE:  AMB   LOCATION:  DSC                                  FACILITY:  [facility]   PHYSICIAN:  Cindee Salt, M.D.                    DATE OF BIRTH:  [birth year]   DATE OF PROCEDURE:  01/06/2003  DATE OF DISCHARGE:                                 OPERATIVE REPORT   PREOPERATIVE DIAGNOSIS:  Carpal tunnel syndrome, right hand.   POSTOPERATIVE DIAGNOSIS:  Carpal tunnel syndrome, right hand.   OPERATION:  Decompression right median nerve.   SURGEON:  Cindee Salt, M.D.   ANESTHESIA:  Forearm-based IV regional.   HISTORY:  The patient is a 50 year old female with a history of carpal  tunnel syndrome.  EMG nerve conductions positive which has not responded to  conservative treatment.   DESCRIPTION OF PROCEDURE:  The patient is brought to the operating room,  where a forearm-based IV regional anesthetic was carried out without  difficulty.  She was prepped and draped using DuraPrep, supine position,  right arm free.  A longitudinal incision was made in the palm, carried down  through subcutaneous tissue.  Bleeders were electrocauterized.  Palmar  fascia was split.  Superficial palmar arch was identified.  The flexor  tendon to the ring, little finger identified.  Staying on this side of the  median nerve, carpal retinaculum was incised with sharp dissection, right-  angle and Sewell retractor placed between skin and forearm fascia.  The  fascia was released for approximately 3 cm proximal to the wrist crease  under direct vision.  Canal was explored.  Tenosynovial tissue was  thickened, and the nerve showed an area of compression.  No further lesions  were identified.  The wound was irrigated.  The skin was closed with  interrupted 5-0 nylon sutures.  Sterile compressive dressing and splint was  applied.  The patient tolerated the procedure well and was taken to  the  recovery room for observation in satisfactory condition.  She is discharged  home to return to [facility] [place] in one week on Vicodin and  Keflex.                                               Cindee Salt, M.D.    Angelique Blonder  D:  01/06/2003  T:  01/07/2003  Job:  [id number]

## 2011-01-19 NOTE — Discharge Summary (Signed)
[name], [name] NO.:  [id number]   MEDICAL RECORD NO.:  [id number]          PATIENT TYPE:  OIB   LOCATION:  9310                          FACILITY:  [facility]   PHYSICIAN:  Malachi Pro. Ambrose Mantle, M.D. DATE OF BIRTH:  1961/07/27   DATE OF ADMISSION:  08/08/2006  DATE OF DISCHARGE:  08/09/2006                               DISCHARGE SUMMARY   HOSPITAL COURSE:  This is a 50 year old white female admitted for  abdominal hysterectomy and bilateral salpingo-oophorectomy because of  severe dysmenorrhea and menorrhagia.  She underwent abdominal  hysterectomy and bilateral salpingo-oophorectomy on August 08, 2006 by  Dr. Ambrose Mantle with Dr. Ellyn Hack assisting.  She did well postoperatively.  She ambulated extremely well without difficulty.  At this point, she has  not voided, but she is going to attempt voiding before discharge, but if  she needs a catheter, a catheter will be inserted.  Her hemoglobin on  admission was 14.5, hematocrit 42.4, white count 8700, platelet count  325,000.  Followup hemoglobin is 13.4 and 12.4.  The differential was  normal.  Comprehensive metabolic profile was normal, except for a  glucose of 101.  Urine pregnancy test was negative.  Pathology report is  pending.  The patient has tolerated liquids and is considered to be  ready for discharge.   FINAL DIAGNOSIS:  Severe menorrhagia and dysmenorrhea, probable  adenomyosis.   OPERATION:  Abdominal hysterectomy and bilateral salpingo-oophorectomy.   FINAL CONDITION:  Improved.   INSTRUCTIONS:  Include our regular discharge instructions.  No vaginal  entrance.  No heavy lifting or strenuous activity.  Call with any heavy  bleeding.  Call with any temperature elevation greater than 100.4  degrees.   FOLLOWUP:  Return to the office in 3 days to have her staples removed.   DISCHARGE MEDICATION:  Percocet 5/325 mg, 36 tablets, one or two every 4-  6 hours as needed for pain is given at  discharge.      Malachi Pro. Ambrose Mantle, M.D.  Electronically Signed     TFH/MEDQ  D:  08/09/2006  T:  08/09/2006  Job:  [id number]

## 2011-01-19 NOTE — Consult Note (Signed)
[name], PICKING                           ACCOUNT NO.:  [id number]   MEDICAL RECORD NO.:  [id number]                   PATIENT TYPE:   LOCATION:                                       FACILITY:   PHYSICIAN:  R. Roetta Sessions, M.D.              DATE OF BIRTH:  [birth year]   DATE OF CONSULTATION:  07/21/2003  DATE OF DISCHARGE:                                   CONSULTATION   CONSULTING PHYSICIAN:  R. Roetta Sessions, M.D.   PRIMARY CARE PHYSICIAN:  Dr. Colon Brewer, [place], [place].   REASON FOR CONSULTATION:  Intermittent rectal bleeding.   HISTORY OF PRESENT ILLNESS:  Elizabeth Brewer is a pleasant 50 year old,  morbidly obese, Caucasian female, smoker, kindly sent over courtesy of Dr.  Colon Brewer to further evaluate a several month history of intermittent  blood per rectum.  She states she has some blood per rectum mixed with a  normal bowel movement typically just before her monthly menstrual cycle.  She clearly notes it is coming from her rectum and starts prior to her  menses.  She has not had any associated abdominal pain, although she has  twinges of right lower quadrant abdominal pain from time to time every few  weeks.  Has not had any diarrhea, no melena.  She has reflux symptoms  forever.  She states has taken Tums and OTC antacids and other agents for  the last several years.  She has been taking some of her husband's Prevacid  30 mg capsules daily with some relief.  Sometimes she has to take the  medication twice daily.  She has never had her upper GI tract evaluated.  She does not have any odynophagia or dysphagia.  Her family history is  significant in that her mother, in her 41s, was diagnosed with colorectal  cancer.  She has never had her upper or lower GI tract evaluated previously.   PAST MEDICAL HISTORY:  Significant for gastroesophageal reflux disease.   PAST SURGERIES:  1. Bilateral carpal tunnel surgery.  2. C-section.   CURRENT  MEDICATIONS:  1. Prevacid 30 mg orally daily.  2. Advil Sinus p.r.n.   ALLERGIES:  No known drug allergies.   FAMILY HISTORY:  Mother had Elizabeth cancer but died from an MI in her 31s.  Father died with an MI.  He was an alcoholic by report.  Otherwise negative  for chronic GI or liver illness.   SOCIAL HISTORY:  The patient has been married for 18 years.  Has a 13-year-  old daughter.  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  She smokes  one pack of cigarettes per day.  No alcohol.   REVIEW OF SYSTEMS:  As in the history of present illness.   PHYSICAL EXAMINATION:  GENERAL:  Reveals a pleasant 50 year old lady resting  comfortably.  VITAL SIGNS:  Weight 204.5, height 5 foot 5, temp  98.6, BP 120/82, pulse 84.  SKIN:  Warm and dry.  There is no jaundice, no connecting stigmata of  chronic liver disease.  HEENT:  No scleral icterus.  Conjunctivae are pink.  Oral cavity, no  lesions.  JVD is not prominent.  CHEST:  Lungs are clear to auscultation.  CARDIAC:  Regular, rate and rhythm without murmur, gallop or rub.  ABDOMEN:  Obese.  Positive bowel sounds.  Soft and nontender without  appreciable mass or organomegaly.  EXTREMITIES:  No edema.  RECTAL:  Deferred till the time of colonoscopy.   IMPRESSION AND RECOMMENDATIONS:  Elizabeth Brewer is a pleasant 50 year old  lady with intermittent rectal bleeding and a positive family history of  colorectal cancer.  She needs to have her lower gastrointestinal tract  evaluated via colonoscopy.  This lady also has significant gastroesophageal  reflux disease, at times refractory to even twice a day proton pump  inhibitor therapy.  The symptoms have been longstanding.  Thus she really  ought to have her upper GI tract evaluated as well.   I have offered Elizabeth Brewer both a colonoscopy and EGD at the same time in  the near future at [facility].  Potential risks, benefits,  alternatives have been reviewed.  Questions answered.   She is agreeable.  I  have explained to her how smoking and being significantly overweight  predisposes one to gastroesophageal reflux disease.  I have given her  literature on antireflux measures/lifestyle.  Will make further  recommendations after endoscopic evaluation has been completed.   I would like to thank Dr. Colon Brewer for letting me see this nice lady  today.  Further recommendations to follow.      ___________________________________________                                            Elizabeth Brewer, M.D.   RMR/MEDQ  D:  07/21/2003  T:  07/21/2003  Job:  [id number]   cc:   Elizabeth Flattery, MD  [address]  [place]  [place] [address]  Fax: [phone]

## 2011-01-30 ENCOUNTER — Encounter: Payer: Self-pay | Admitting: Physician Assistant

## 2011-06-14 LAB — BLOOD GAS, ARTERIAL
Bicarbonate: 22.9
Patient temperature: 37
TCO2: 20.4
pCO2 arterial: 37.7
pH, Arterial: 7.401 — ABNORMAL HIGH

## 2012-08-15 ENCOUNTER — Other Ambulatory Visit (HOSPITAL_COMMUNITY): Payer: Self-pay | Admitting: *Deleted

## 2012-08-15 ENCOUNTER — Ambulatory Visit (HOSPITAL_COMMUNITY)
Admission: RE | Admit: 2012-08-15 | Discharge: 2012-08-15 | Disposition: A | Discharge status: Home / Self Care | Payer: 59 | Source: Ambulatory Visit | Attending: *Deleted | Admitting: *Deleted

## 2012-08-15 DIAGNOSIS — R05 Cough: Secondary | ICD-10-CM

## 2012-08-15 DIAGNOSIS — R059 Cough, unspecified: Secondary | ICD-10-CM | POA: Insufficient documentation

## 2013-08-04 ENCOUNTER — Encounter: Payer: Self-pay | Admitting: Internal Medicine

## 2013-11-17 ENCOUNTER — Ambulatory Visit: Payer: 59 | Admitting: Gastroenterology

## 2013-12-11 ENCOUNTER — Ambulatory Visit (INDEPENDENT_AMBULATORY_CARE_PROVIDER_SITE_OTHER): Payer: 59 | Admitting: Gastroenterology

## 2013-12-11 ENCOUNTER — Encounter: Payer: Self-pay | Admitting: Gastroenterology

## 2013-12-11 ENCOUNTER — Ambulatory Visit: Payer: 59 | Admitting: Gastroenterology

## 2013-12-11 ENCOUNTER — Encounter (INDEPENDENT_AMBULATORY_CARE_PROVIDER_SITE_OTHER): Payer: Self-pay

## 2013-12-11 VITALS — BP 127/75 | HR 65 | Temp 97.5°F | Ht 64.0 in | Wt 218.2 lb

## 2013-12-11 DIAGNOSIS — Z800 Family history of malignant neoplasm of digestive organs: Secondary | ICD-10-CM | POA: Insufficient documentation

## 2013-12-11 DIAGNOSIS — K219 Gastro-esophageal reflux disease without esophagitis: Secondary | ICD-10-CM

## 2013-12-11 NOTE — Assessment & Plan Note (Signed)
Family history of colon cancer, mother at age 53. Patient is due for high-risk screening colonoscopy.  I have discussed the risks, alternatives, benefits with regards to but not limited to the risk of reaction to medication, bleeding, infection, perforation and the patient is agreeable to proceed. Written consent to be obtained.

## 2013-12-11 NOTE — Progress Notes (Signed)
Primary Care Physician:  Delphina Cahill, MD  Primary Gastroenterologist:  Garfield Cornea, MD   Chief Complaint  Patient presents with  . Colonoscopy    HPI:  Elizabeth Brewer is a 53 y.o. female here to schedule colonoscopy. Mother diagnosed with colon cancer at age 29. Patient has had high-risk ring colonoscopies since 2004. No polyps on 2004 colonoscopy. December 2009 she had another colonoscopy with 2 small polyps removed, hyperplastic with no adenomatous changes. She has been doing well. Denies any constipation, diarrhea, melena, rectal bleeding, abdominal pain, heartburn, dysphagia, vomiting, unintentional weight loss. Nexium controls her heartburn.  Current Outpatient Prescriptions  Medication Sig Dispense Refill  . amlodipine-olmesartan (AZOR) 10-20 MG per tablet Take 1 tablet by mouth daily.      . citalopram (CELEXA) 10 MG tablet Take 10 mg by mouth daily.      Marland Kitchen esomeprazole (NEXIUM) 40 MG capsule Take 40 mg by mouth daily at 12 noon.      Marland Kitchen levothyroxine (SYNTHROID, LEVOTHROID) 88 MCG tablet Take 88 mcg by mouth daily before breakfast.      . rosuvastatin (CRESTOR) 20 MG tablet Take 20 mg by mouth daily.       No current facility-administered medications for this visit.    Allergies as of 12/11/2013  . (No Known Allergies)    Past Medical History  Diagnosis Date  . HTN (hypertension)   . Hyperlipidemia   . GERD (gastroesophageal reflux disease)   . Hypothyroid     Past Surgical History  Procedure Laterality Date  . Colonoscopy  08/30/2008    FAO:ZHYQMVH internal hemorrhoids, single dimunitive polyp status post cold biopsy/removal.  The remainder of the rectal mucosa appeared normal/Left-sided diverticula, dimunitive with hepatic flexure polyp status post cold biopsy/removal.  Colonic mucosa appeared normal  . Esophagogastroduodenoscopy  08/03/2003    RMR:A couple of tiny erosions consistent with mild, erosive reflux esophagitis; otherwise normal esophageal mucosa, normal  stomach  . Colonoscopy  08/03/2003    QIO:NGEXBMWU hemorrhoids, otherwise normal rectum, normal colon    Family History  Problem Relation Age of Onset  . Colon cancer Mother     History   Social History  . Marital Status: Married    Spouse Name: N/A    Number of Children: N/A  . Years of Education: N/A   Occupational History  . Not on file.   Social History Main Topics  . Smoking status: Current Every Day Smoker -- 0.50 packs/day    Types: Cigarettes  . Smokeless tobacco: Not on file  . Alcohol Use: No  . Drug Use: No  . Sexual Activity: Not on file   Other Topics Concern  . Not on file   Social History Narrative  . No narrative on file      ROS:  General: Negative for anorexia, weight loss, fever, chills, fatigue, weakness. Eyes: Negative for vision changes.  ENT: Negative for hoarseness, difficulty swallowing , nasal congestion. CV: Negative for chest pain, angina, palpitations, dyspnea on exertion, peripheral edema.  Respiratory: Negative for dyspnea at rest, dyspnea on exertion, cough, sputum, wheezing.  GI: See history of present illness. GU:  Negative for dysuria, hematuria, urinary incontinence, urinary frequency, nocturnal urination.  MS: Negative for joint pain, low back pain.  Derm: Negative for rash or itching.  Neuro: Negative for weakness, abnormal sensation, seizure, frequent headaches, memory loss, confusion.  Psych: Negative for anxiety, depression, suicidal ideation, hallucinations.  Endo: Negative for unusual weight change.  Heme: Negative for bruising or bleeding.  Allergy: Negative for rash or hives.    Physical Examination:  BP 127/75  Pulse 65  Temp(Src) 97.5 F (36.4 C) (Oral)  Ht 5\' 4"  (1.626 m)  Wt 218 lb 3.2 oz (98.975 kg)  BMI 37.44 kg/m2   General: Well-nourished, well-developed in no acute distress.  Head: Normocephalic, atraumatic.   Eyes: Conjunctiva pink, no icterus. Mouth: Oropharyngeal mucosa moist and pink , no  lesions erythema or exudate. Neck: Supple without thyromegaly, masses, or lymphadenopathy.  Lungs: Clear to auscultation bilaterally.  Heart: Regular rate and rhythm, no murmurs rubs or gallops.  Abdomen: Bowel sounds are normal, nontender, nondistended, no hepatosplenomegaly or masses, no abdominal bruits or    hernia , no rebound or guarding.   Rectal: Not performed Extremities: No lower extremity edema. No clubbing or deformities.  Neuro: Alert and oriented x 4 , grossly normal neurologically.  Skin: Warm and dry, no rash or jaundice.   Psych: Alert and cooperative, normal mood and affect.

## 2013-12-11 NOTE — Assessment & Plan Note (Signed)
Well-controlled on current regimen. ?

## 2013-12-11 NOTE — Patient Instructions (Signed)
1. Colonoscopy with Dr. Rourk. See separate instructions.  

## 2013-12-14 NOTE — Progress Notes (Signed)
cc'd to pcp 

## 2013-12-15 ENCOUNTER — Other Ambulatory Visit: Payer: Self-pay | Admitting: Internal Medicine

## 2013-12-15 DIAGNOSIS — Z800 Family history of malignant neoplasm of digestive organs: Secondary | ICD-10-CM

## 2013-12-15 MED ORDER — PEG 3350-KCL-NA BICARB-NACL 420 G PO SOLR: 4000.0000 mL | ORAL | Status: DC

## 2014-01-04 ENCOUNTER — Encounter (HOSPITAL_COMMUNITY)
Admission: RE | Disposition: A | Discharge status: Home / Self Care | Payer: Self-pay | Source: Ambulatory Visit | Attending: Internal Medicine

## 2014-01-04 ENCOUNTER — Encounter (HOSPITAL_COMMUNITY): Payer: Self-pay

## 2014-01-04 ENCOUNTER — Ambulatory Visit (HOSPITAL_COMMUNITY)
Admission: RE | Admit: 2014-01-04 | Discharge: 2014-01-04 | Disposition: A | Discharge status: Home / Self Care | Payer: 59 | Source: Ambulatory Visit | Attending: Internal Medicine | Admitting: Internal Medicine

## 2014-01-04 DIAGNOSIS — Z79899 Other long term (current) drug therapy: Secondary | ICD-10-CM | POA: Insufficient documentation

## 2014-01-04 DIAGNOSIS — K219 Gastro-esophageal reflux disease without esophagitis: Secondary | ICD-10-CM | POA: Insufficient documentation

## 2014-01-04 DIAGNOSIS — K648 Other hemorrhoids: Secondary | ICD-10-CM | POA: Insufficient documentation

## 2014-01-04 DIAGNOSIS — F17200 Nicotine dependence, unspecified, uncomplicated: Secondary | ICD-10-CM | POA: Insufficient documentation

## 2014-01-04 DIAGNOSIS — E039 Hypothyroidism, unspecified: Secondary | ICD-10-CM | POA: Insufficient documentation

## 2014-01-04 DIAGNOSIS — I10 Essential (primary) hypertension: Secondary | ICD-10-CM | POA: Insufficient documentation

## 2014-01-04 DIAGNOSIS — Z1211 Encounter for screening for malignant neoplasm of colon: Secondary | ICD-10-CM | POA: Insufficient documentation

## 2014-01-04 DIAGNOSIS — E785 Hyperlipidemia, unspecified: Secondary | ICD-10-CM | POA: Insufficient documentation

## 2014-01-04 DIAGNOSIS — Z800 Family history of malignant neoplasm of digestive organs: Secondary | ICD-10-CM | POA: Insufficient documentation

## 2014-01-04 DIAGNOSIS — D128 Benign neoplasm of rectum: Secondary | ICD-10-CM | POA: Insufficient documentation

## 2014-01-04 DIAGNOSIS — D126 Benign neoplasm of colon, unspecified: Secondary | ICD-10-CM | POA: Insufficient documentation

## 2014-01-04 DIAGNOSIS — Z86010 Personal history of colonic polyps: Secondary | ICD-10-CM

## 2014-01-04 DIAGNOSIS — K649 Unspecified hemorrhoids: Secondary | ICD-10-CM

## 2014-01-04 DIAGNOSIS — K5730 Diverticulosis of large intestine without perforation or abscess without bleeding: Secondary | ICD-10-CM | POA: Insufficient documentation

## 2014-01-04 DIAGNOSIS — D129 Benign neoplasm of anus and anal canal: Secondary | ICD-10-CM

## 2014-01-04 HISTORY — PX: COLONOSCOPY: SHX5424

## 2014-01-04 SURGERY — COLONOSCOPY
Anesthesia: Moderate Sedation

## 2014-01-04 MED ORDER — MEPERIDINE HCL 100 MG/ML IJ SOLN
INTRAMUSCULAR | Status: DC | PRN
  Administered 2014-01-04: 25 mg via INTRAVENOUS
  Administered 2014-01-04: 50 mg via INTRAVENOUS

## 2014-01-04 MED ORDER — SODIUM CHLORIDE 0.9 % IV SOLN
INTRAVENOUS | Status: DC
  Administered 2014-01-04: 09:00:00 via INTRAVENOUS

## 2014-01-04 MED ORDER — PROMETHAZINE HCL 25 MG/ML IJ SOLN
25.0000 mg | Freq: Once | INTRAMUSCULAR | Status: AC
  Administered 2014-01-04: 25 mg via INTRAVENOUS

## 2014-01-04 MED ORDER — ONDANSETRON HCL 4 MG/2ML IJ SOLN
INTRAMUSCULAR | Status: DC | PRN
  Administered 2014-01-04: 4 mg via INTRAVENOUS

## 2014-01-04 MED ORDER — ONDANSETRON HCL 4 MG/2ML IJ SOLN
INTRAMUSCULAR | Status: AC
  Filled 2014-01-04: qty 2

## 2014-01-04 MED ORDER — MIDAZOLAM HCL 5 MG/5ML IJ SOLN
INTRAMUSCULAR | Status: DC | PRN
  Administered 2014-01-04: 1 mg via INTRAVENOUS
  Administered 2014-01-04 (×2): 2 mg via INTRAVENOUS

## 2014-01-04 MED ORDER — PROMETHAZINE HCL 25 MG/ML IJ SOLN
INTRAMUSCULAR | Status: AC
  Filled 2014-01-04: qty 1

## 2014-01-04 MED ORDER — MEPERIDINE HCL 100 MG/ML IJ SOLN
INTRAMUSCULAR | Status: AC
  Filled 2014-01-04: qty 2

## 2014-01-04 MED ORDER — MIDAZOLAM HCL 5 MG/5ML IJ SOLN
INTRAMUSCULAR | Status: AC
  Filled 2014-01-04: qty 10

## 2014-01-04 MED ORDER — SODIUM CHLORIDE 0.9 % IJ SOLN
INTRAMUSCULAR | Status: AC
  Filled 2014-01-04: qty 10

## 2014-01-04 MED ORDER — STERILE WATER FOR IRRIGATION IR SOLN
Status: DC | PRN
  Administered 2014-01-04: 11:00:00

## 2014-01-04 NOTE — Interval H&P Note (Signed)
History and Physical Interval Note:  01/04/2014 10:33 AM  Elizabeth Brewer  has presented today for surgery, with the diagnosis of FAMILY HISTORY OF COLON RECTAL CANCER  The various methods of treatment have been discussed with the patient and family. After consideration of risks, benefits and other options for treatment, the patient has consented to  Procedure(s) with comments: COLONOSCOPY (N/A) - 9:45 as a surgical intervention .  The patient's history has been reviewed, patient examined, no change in status, stable for surgery.  I have reviewed the patient's chart and labs.  Questions were answered to the patient's satisfaction.   No change. Colonoscopy per plan.The risks, benefits, limitations, alternatives and imponderables have been reviewed with the patient. Questions have been answered. All parties are agreeable.   Cristopher Estimable Varsha Knock

## 2014-01-04 NOTE — Op Note (Signed)
[facility] [address] [place], [address]   COLONOSCOPY PROCEDURE REPORT  PATIENT: Elizabeth, Brewer  MR#:         [id number] BIRTHDATE: Apr 29, 1961 , 52  yrs. old GENDER: Female ENDOSCOPIST: R.  Garfield Cornea, MD FACP FACG REFERRED BY:  Delphina Cahill, M.D. PROCEDURE DATE:  01/04/2014 PROCEDURE:     Colonoscopy with biopsy  INDICATIONS: High-risk screening examination; positive family history of colon cancer in first relative at a relatively young age  INFORMED CONSENT:  The risks, benefits, alternatives and imponderables including but not limited to bleeding, perforation as well as the possibility of a missed lesion have been reviewed.  The potential for biopsy, lesion removal, etc. have also been discussed.  Questions have been answered.  All parties agreeable. Please see the history and physical in the medical record for more information.  MEDICATIONS: Versed 5 mg IV and Demerol 75 mg IV in divided doses. Zofran 4 mg IV.  DESCRIPTION OF PROCEDURE:  After a digital rectal exam was performed, the EC-3890Li ([id number])  colonoscope was advanced from the anus through the rectum and colon to the area of the cecum, ileocecal valve and appendiceal orifice.  The cecum was deeply intubated.  These structures were well-seen and photographed for the record.  From the level of the cecum and ileocecal valve, the scope was slowly and cautiously withdrawn.  The mucosal surfaces were carefully surveyed utilizing scope tip deflection to facilitate fold flattening as needed.  The scope was pulled down into the rectum where a thorough examination including retroflexion was performed.    FINDINGS:  Adequate preparation. Internal hemorrhoids; (1) diminutive polyp in the rectum at 5 cm from anal verge; otherwise, remainder of rectal mucosa appeared normal. The patient was noted to have scattered left-sided diverticula; there were (4) used diminutive polyps in mid descending  segment; otherwise, aside from one to 2 mm mamillation's scattered about the descending and sigmoid segment, the remainder of colonic mucosa appeared normal.  THERAPEUTIC / DIAGNOSTIC MANEUVERS PERFORMED:  The above-mentioned polyps were cold biopsied/removed.  COMPLICATIONS: None  CECAL WITHDRAWAL TIME:  11 minutes  IMPRESSION:  Rectal and colonic polyps-removed as described above. Hemorrhoids. Colonic diverticulosis.  RECOMMENDATIONS: Followup on pathology.   _______________________________ eSigned:  R. Garfield Cornea, MD FACP [facility] 01/04/2014 11:05 AM   CC:

## 2014-01-04 NOTE — H&P (View-Only) (Signed)
Primary Care Physician:  Delphina Cahill, MD  Primary Gastroenterologist:  Garfield Cornea, MD   Chief Complaint  Patient presents with  . Colonoscopy    HPI:  Elizabeth Brewer is a 53 y.o. female here to schedule colonoscopy. Mother diagnosed with colon cancer at age 29. Patient has had high-risk ring colonoscopies since 2004. No polyps on 2004 colonoscopy. December 2009 she had another colonoscopy with 2 small polyps removed, hyperplastic with no adenomatous changes. She has been doing well. Denies any constipation, diarrhea, melena, rectal bleeding, abdominal pain, heartburn, dysphagia, vomiting, unintentional weight loss. Nexium controls her heartburn.  Current Outpatient Prescriptions  Medication Sig Dispense Refill  . amlodipine-olmesartan (AZOR) 10-20 MG per tablet Take 1 tablet by mouth daily.      . citalopram (CELEXA) 10 MG tablet Take 10 mg by mouth daily.      Marland Kitchen esomeprazole (NEXIUM) 40 MG capsule Take 40 mg by mouth daily at 12 noon.      Marland Kitchen levothyroxine (SYNTHROID, LEVOTHROID) 88 MCG tablet Take 88 mcg by mouth daily before breakfast.      . rosuvastatin (CRESTOR) 20 MG tablet Take 20 mg by mouth daily.       No current facility-administered medications for this visit.    Allergies as of 12/11/2013  . (No Known Allergies)    Past Medical History  Diagnosis Date  . HTN (hypertension)   . Hyperlipidemia   . GERD (gastroesophageal reflux disease)   . Hypothyroid     Past Surgical History  Procedure Laterality Date  . Colonoscopy  08/30/2008    FAO:ZHYQMVH internal hemorrhoids, single dimunitive polyp status post cold biopsy/removal.  The remainder of the rectal mucosa appeared normal/Left-sided diverticula, dimunitive with hepatic flexure polyp status post cold biopsy/removal.  Colonic mucosa appeared normal  . Esophagogastroduodenoscopy  08/03/2003    RMR:A couple of tiny erosions consistent with mild, erosive reflux esophagitis; otherwise normal esophageal mucosa, normal  stomach  . Colonoscopy  08/03/2003    QIO:NGEXBMWU hemorrhoids, otherwise normal rectum, normal colon    Family History  Problem Relation Age of Onset  . Colon cancer Mother     History   Social History  . Marital Status: Married    Spouse Name: N/A    Number of Children: N/A  . Years of Education: N/A   Occupational History  . Not on file.   Social History Main Topics  . Smoking status: Current Every Day Smoker -- 0.50 packs/day    Types: Cigarettes  . Smokeless tobacco: Not on file  . Alcohol Use: No  . Drug Use: No  . Sexual Activity: Not on file   Other Topics Concern  . Not on file   Social History Narrative  . No narrative on file      ROS:  General: Negative for anorexia, weight loss, fever, chills, fatigue, weakness. Eyes: Negative for vision changes.  ENT: Negative for hoarseness, difficulty swallowing , nasal congestion. CV: Negative for chest pain, angina, palpitations, dyspnea on exertion, peripheral edema.  Respiratory: Negative for dyspnea at rest, dyspnea on exertion, cough, sputum, wheezing.  GI: See history of present illness. GU:  Negative for dysuria, hematuria, urinary incontinence, urinary frequency, nocturnal urination.  MS: Negative for joint pain, low back pain.  Derm: Negative for rash or itching.  Neuro: Negative for weakness, abnormal sensation, seizure, frequent headaches, memory loss, confusion.  Psych: Negative for anxiety, depression, suicidal ideation, hallucinations.  Endo: Negative for unusual weight change.  Heme: Negative for bruising or bleeding.  Allergy: Negative for rash or hives.    Physical Examination:  BP 127/75  Pulse 65  Temp(Src) 97.5 F (36.4 C) (Oral)  Ht 5\' 4"  (1.626 m)  Wt 218 lb 3.2 oz (98.975 kg)  BMI 37.44 kg/m2   General: Well-nourished, well-developed in no acute distress.  Head: Normocephalic, atraumatic.   Eyes: Conjunctiva pink, no icterus. Mouth: Oropharyngeal mucosa moist and pink , no  lesions erythema or exudate. Neck: Supple without thyromegaly, masses, or lymphadenopathy.  Lungs: Clear to auscultation bilaterally.  Heart: Regular rate and rhythm, no murmurs rubs or gallops.  Abdomen: Bowel sounds are normal, nontender, nondistended, no hepatosplenomegaly or masses, no abdominal bruits or    hernia , no rebound or guarding.   Rectal: Not performed Extremities: No lower extremity edema. No clubbing or deformities.  Neuro: Alert and oriented x 4 , grossly normal neurologically.  Skin: Warm and dry, no rash or jaundice.   Psych: Alert and cooperative, normal mood and affect.

## 2014-01-04 NOTE — Discharge Instructions (Signed)

## 2014-01-05 ENCOUNTER — Encounter: Payer: Self-pay | Admitting: Internal Medicine

## 2014-01-07 ENCOUNTER — Encounter (HOSPITAL_COMMUNITY): Payer: Self-pay | Admitting: Internal Medicine

## 2014-05-13 ENCOUNTER — Other Ambulatory Visit (HOSPITAL_COMMUNITY): Payer: Self-pay | Admitting: Internal Medicine

## 2014-05-13 DIAGNOSIS — R1011 Right upper quadrant pain: Secondary | ICD-10-CM

## 2014-05-14 ENCOUNTER — Other Ambulatory Visit: Payer: Self-pay | Admitting: Family Medicine

## 2014-05-17 ENCOUNTER — Ambulatory Visit (HOSPITAL_COMMUNITY)
Admission: RE | Admit: 2014-05-17 | Discharge: 2014-05-17 | Disposition: A | Discharge status: Home / Self Care | Payer: 59 | Source: Ambulatory Visit | Attending: Internal Medicine | Admitting: Internal Medicine

## 2014-05-17 DIAGNOSIS — K7689 Other specified diseases of liver: Secondary | ICD-10-CM | POA: Insufficient documentation

## 2014-05-17 DIAGNOSIS — R1011 Right upper quadrant pain: Secondary | ICD-10-CM | POA: Diagnosis not present

## 2014-05-24 ENCOUNTER — Other Ambulatory Visit (HOSPITAL_COMMUNITY): Payer: 59

## 2014-07-21 ENCOUNTER — Other Ambulatory Visit (INDEPENDENT_AMBULATORY_CARE_PROVIDER_SITE_OTHER): Payer: Self-pay

## 2014-07-21 DIAGNOSIS — R1011 Right upper quadrant pain: Secondary | ICD-10-CM

## 2014-08-03 ENCOUNTER — Encounter (HOSPITAL_COMMUNITY)
Admission: RE | Admit: 2014-08-03 | Discharge: 2014-08-03 | Disposition: A | Discharge status: Home / Self Care | Payer: 59 | Source: Ambulatory Visit | Attending: General Surgery | Admitting: General Surgery

## 2014-08-03 ENCOUNTER — Encounter (HOSPITAL_COMMUNITY): Payer: Self-pay

## 2014-08-03 DIAGNOSIS — R1011 Right upper quadrant pain: Secondary | ICD-10-CM | POA: Insufficient documentation

## 2014-08-03 MED ORDER — TECHNETIUM TC 99M MEBROFENIN IV KIT
5.0000 | PACK | Freq: Once | INTRAVENOUS | Status: AC | PRN
  Administered 2014-08-03: 5 via INTRAVENOUS

## 2014-08-03 MED ORDER — SODIUM CHLORIDE 0.9 % IJ SOLN
INTRAMUSCULAR | Status: AC
  Filled 2014-08-03: qty 12

## 2014-08-03 MED ORDER — SINCALIDE 5 MCG IJ SOLR
INTRAMUSCULAR | Status: AC
  Administered 2014-08-03: 1.98 ug via INTRAVENOUS
  Filled 2014-08-03: qty 5

## 2014-08-03 MED ORDER — STERILE WATER FOR INJECTION IJ SOLN
INTRAMUSCULAR | Status: AC
  Administered 2014-08-03: 1.98 mL via INTRAVENOUS
  Filled 2014-08-03: qty 10

## 2014-11-08 ENCOUNTER — Other Ambulatory Visit (HOSPITAL_COMMUNITY): Payer: Self-pay | Admitting: Internal Medicine

## 2014-11-08 DIAGNOSIS — G8929 Other chronic pain: Secondary | ICD-10-CM

## 2014-11-08 DIAGNOSIS — R1011 Right upper quadrant pain: Principal | ICD-10-CM

## 2014-11-12 ENCOUNTER — Ambulatory Visit (HOSPITAL_COMMUNITY)
Admission: RE | Admit: 2014-11-12 | Discharge: 2014-11-12 | Disposition: A | Discharge status: Home / Self Care | Payer: 59 | Source: Ambulatory Visit | Attending: Internal Medicine | Admitting: Internal Medicine

## 2014-11-12 ENCOUNTER — Other Ambulatory Visit (HOSPITAL_COMMUNITY): Payer: 59

## 2014-11-12 DIAGNOSIS — K824 Cholesterolosis of gallbladder: Secondary | ICD-10-CM | POA: Diagnosis present

## 2014-11-12 DIAGNOSIS — G8929 Other chronic pain: Secondary | ICD-10-CM

## 2014-11-12 DIAGNOSIS — R1011 Right upper quadrant pain: Secondary | ICD-10-CM

## 2015-03-10 ENCOUNTER — Emergency Department (HOSPITAL_COMMUNITY): Payer: No Typology Code available for payment source

## 2015-03-10 ENCOUNTER — Encounter (HOSPITAL_COMMUNITY): Payer: Self-pay

## 2015-03-10 ENCOUNTER — Emergency Department (HOSPITAL_COMMUNITY)
Admission: EM | Admit: 2015-03-10 | Discharge: 2015-03-11 | Disposition: A | Discharge status: Home / Self Care | Payer: No Typology Code available for payment source | Attending: Emergency Medicine | Admitting: Emergency Medicine

## 2015-03-10 DIAGNOSIS — I10 Essential (primary) hypertension: Secondary | ICD-10-CM | POA: Diagnosis not present

## 2015-03-10 DIAGNOSIS — S12190A Other displaced fracture of second cervical vertebra, initial encounter for closed fracture: Secondary | ICD-10-CM | POA: Diagnosis not present

## 2015-03-10 DIAGNOSIS — E785 Hyperlipidemia, unspecified: Secondary | ICD-10-CM | POA: Diagnosis not present

## 2015-03-10 DIAGNOSIS — Z23 Encounter for immunization: Secondary | ICD-10-CM | POA: Diagnosis not present

## 2015-03-10 DIAGNOSIS — K219 Gastro-esophageal reflux disease without esophagitis: Secondary | ICD-10-CM | POA: Diagnosis not present

## 2015-03-10 DIAGNOSIS — Y92410 Unspecified street and highway as the place of occurrence of the external cause: Secondary | ICD-10-CM | POA: Insufficient documentation

## 2015-03-10 DIAGNOSIS — S50312A Abrasion of left elbow, initial encounter: Secondary | ICD-10-CM | POA: Insufficient documentation

## 2015-03-10 DIAGNOSIS — S3991XA Unspecified injury of abdomen, initial encounter: Secondary | ICD-10-CM | POA: Insufficient documentation

## 2015-03-10 DIAGNOSIS — S0990XA Unspecified injury of head, initial encounter: Secondary | ICD-10-CM | POA: Insufficient documentation

## 2015-03-10 DIAGNOSIS — Z720 Tobacco use: Secondary | ICD-10-CM | POA: Diagnosis not present

## 2015-03-10 DIAGNOSIS — S70212A Abrasion, left hip, initial encounter: Secondary | ICD-10-CM | POA: Diagnosis not present

## 2015-03-10 DIAGNOSIS — Y998 Other external cause status: Secondary | ICD-10-CM | POA: Insufficient documentation

## 2015-03-10 DIAGNOSIS — S29001A Unspecified injury of muscle and tendon of front wall of thorax, initial encounter: Secondary | ICD-10-CM | POA: Diagnosis not present

## 2015-03-10 DIAGNOSIS — Z79899 Other long term (current) drug therapy: Secondary | ICD-10-CM | POA: Insufficient documentation

## 2015-03-10 DIAGNOSIS — S12100A Unspecified displaced fracture of second cervical vertebra, initial encounter for closed fracture: Secondary | ICD-10-CM

## 2015-03-10 DIAGNOSIS — S199XXA Unspecified injury of neck, initial encounter: Secondary | ICD-10-CM | POA: Insufficient documentation

## 2015-03-10 DIAGNOSIS — Y9389 Activity, other specified: Secondary | ICD-10-CM | POA: Diagnosis not present

## 2015-03-10 DIAGNOSIS — R569 Unspecified convulsions: Secondary | ICD-10-CM

## 2015-03-10 HISTORY — DX: Unspecified convulsions: R56.9

## 2015-03-10 LAB — COMPREHENSIVE METABOLIC PANEL
ALT: 25 U/L (ref 14–54)
AST: 34 U/L (ref 15–41)
Albumin: 3.5 g/dL (ref 3.5–5.0)
Alkaline Phosphatase: 77 U/L (ref 38–126)
Anion gap: 9 (ref 5–15)
BILIRUBIN TOTAL: 0.5 mg/dL (ref 0.3–1.2)
BUN: <5 mg/dL — ABNORMAL LOW (ref 6–20)
CO2: 25 mmol/L (ref 22–32)
CREATININE: 0.64 mg/dL (ref 0.44–1.00)
Calcium: 8.9 mg/dL (ref 8.9–10.3)
Chloride: 104 mmol/L (ref 101–111)
GFR calc Af Amer: >60 mL/min (ref >60)
GFR calc non Af Amer: >60 mL/min (ref >60)
GLUCOSE: 168 mg/dL — AB (ref 65–99)
Potassium: 3.2 mmol/L — ABNORMAL LOW (ref 3.5–5.1)
SODIUM: 138 mmol/L (ref 135–145)
Total Protein: 6.8 g/dL (ref 6.5–8.1)

## 2015-03-10 LAB — CBC
HCT: 41.7 % (ref 36.0–46.0)
Hemoglobin: 14.5 g/dL (ref 12.0–15.0)
MCH: 32.6 pg (ref 26.0–34.0)
MCHC: 34.8 g/dL (ref 30.0–36.0)
MCV: 93.7 fL (ref 78.0–100.0)
Platelets: 268 10*3/uL (ref 150–400)
RBC: 4.45 MIL/uL (ref 3.87–5.11)
RDW: 13.8 % (ref 11.5–15.5)
WBC: 19.6 10*3/uL — ABNORMAL HIGH (ref 4.0–10.5)

## 2015-03-10 LAB — SAMPLE TO BLOOD BANK

## 2015-03-10 LAB — ETHANOL

## 2015-03-10 LAB — PROTIME-INR
INR: 1.06 (ref 0.00–1.49)
PROTHROMBIN TIME: 14 s (ref 11.6–15.2)

## 2015-03-10 MED ORDER — IOHEXOL 300 MG/ML  SOLN
100.0000 mL | Freq: Once | INTRAMUSCULAR | Status: AC | PRN
  Administered 2015-03-10: 100 mL via INTRAVENOUS

## 2015-03-10 MED ORDER — TETANUS-DIPHTH-ACELL PERTUSSIS 5-2.5-18.5 LF-MCG/0.5 IM SUSP
0.5000 mL | Freq: Once | INTRAMUSCULAR | Status: AC
  Administered 2015-03-10: 0.5 mL via INTRAMUSCULAR
  Filled 2015-03-10: qty 0.5

## 2015-03-10 MED ORDER — OXYCODONE-ACETAMINOPHEN 5-325 MG PO TABS: 1.0000 | ORAL_TABLET | Freq: Four times a day (QID) | ORAL | Status: DC | PRN

## 2015-03-10 MED ORDER — SODIUM CHLORIDE 0.9 % IV BOLUS (SEPSIS)
500.0000 mL | Freq: Once | INTRAVENOUS | Status: AC
  Administered 2015-03-10: 500 mL via INTRAVENOUS

## 2015-03-10 MED ORDER — DOCUSATE SODIUM 100 MG PO CAPS: 100.0000 mg | ORAL_CAPSULE | Freq: Every day | ORAL | Status: DC | PRN

## 2015-03-10 MED ORDER — HYDROMORPHONE HCL 1 MG/ML IJ SOLN
1.0000 mg | Freq: Once | INTRAMUSCULAR | Status: AC
  Administered 2015-03-10: 1 mg via INTRAVENOUS
  Filled 2015-03-10: qty 1

## 2015-03-10 NOTE — ED Notes (Signed)
Pt is aware urine is needed, pt placed on bed pan. Pt unable to urinate.
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
## 2015-03-10 NOTE — ED Notes (Signed)
O2 low Singac 2L given
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
## 2015-03-11 ENCOUNTER — Emergency Department (HOSPITAL_COMMUNITY): Payer: No Typology Code available for payment source

## 2015-03-11 ENCOUNTER — Emergency Department (HOSPITAL_COMMUNITY)
Admission: EM | Admit: 2015-03-11 | Discharge: 2015-03-11 | Disposition: A | Discharge status: Home / Self Care | Payer: No Typology Code available for payment source | Attending: Emergency Medicine | Admitting: Emergency Medicine

## 2015-03-11 ENCOUNTER — Encounter (HOSPITAL_COMMUNITY): Payer: Self-pay | Admitting: Emergency Medicine

## 2015-03-11 DIAGNOSIS — E079 Disorder of thyroid, unspecified: Secondary | ICD-10-CM | POA: Insufficient documentation

## 2015-03-11 DIAGNOSIS — Y9389 Activity, other specified: Secondary | ICD-10-CM | POA: Diagnosis not present

## 2015-03-11 DIAGNOSIS — Y92410 Unspecified street and highway as the place of occurrence of the external cause: Secondary | ICD-10-CM | POA: Insufficient documentation

## 2015-03-11 DIAGNOSIS — Y998 Other external cause status: Secondary | ICD-10-CM | POA: Insufficient documentation

## 2015-03-11 DIAGNOSIS — Z79899 Other long term (current) drug therapy: Secondary | ICD-10-CM | POA: Insufficient documentation

## 2015-03-11 DIAGNOSIS — E785 Hyperlipidemia, unspecified: Secondary | ICD-10-CM | POA: Insufficient documentation

## 2015-03-11 DIAGNOSIS — S8991XA Unspecified injury of right lower leg, initial encounter: Secondary | ICD-10-CM | POA: Diagnosis present

## 2015-03-11 DIAGNOSIS — Z720 Tobacco use: Secondary | ICD-10-CM | POA: Diagnosis not present

## 2015-03-11 DIAGNOSIS — K219 Gastro-esophageal reflux disease without esophagitis: Secondary | ICD-10-CM | POA: Insufficient documentation

## 2015-03-11 DIAGNOSIS — I10 Essential (primary) hypertension: Secondary | ICD-10-CM | POA: Diagnosis not present

## 2015-03-11 DIAGNOSIS — S82144A Nondisplaced bicondylar fracture of right tibia, initial encounter for closed fracture: Secondary | ICD-10-CM | POA: Diagnosis not present

## 2015-03-11 DIAGNOSIS — S82141A Displaced bicondylar fracture of right tibia, initial encounter for closed fracture: Secondary | ICD-10-CM

## 2015-03-11 LAB — CDS SEROLOGY

## 2015-03-11 NOTE — Progress Notes (Signed)
CM contacted by ED RN about patient changing their mind and requesting a hospital bed. Order has been placed. Pt discharged before CM could see patient again. Emma of [facility] made aware of DME order and will obtain pt info from chart. Hospital bed will be delivered to pt home. No further CM needs.
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
## 2015-03-11 NOTE — ED Notes (Signed)
Pt. Left with all belongings 
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
## 2015-03-11 NOTE — Discharge Instructions (Signed)
Tibial and Fibular Fracture, Adult Tibial and fibular fracture is a break in the bones of your lower leg (tibia and fibula). The tibia is the larger of these two bones. The fibula is the smaller of the two bones. It is on the outer side of your leg.  CAUSES  Low-energy injuries, such as a fall from ground level.  High-energy injuries, such as motor vehicle injuries, gunshot wounds, or high-speed sports collisions. RISK FACTORS  Jumping activities.  Repetitive stress, such as long-distance running.  Participation in sports.  Osteoporosis.  Advanced age. SIGNS AND SYMPTOMS  Pain.  Swelling.  Inability to put weight on your injured leg.  Bone deformities at the site of your injury.  Bruising. DIAGNOSIS  Tibial and fibular fractures are diagnosed with the use of X-ray exams. TREATMENT  If you have a simple fracture of these two bones, they can be treated with simple immobilization. A cast or splint will be used on your leg to keep it from moving while it heals. Then you can begin range-of-motion exercises to regain your knee motion. HOME CARE INSTRUCTIONS   Apply ice to your leg:  Put ice in a plastic bag.  Place a towel between your skin and the bag.  Leave the ice on for 20 minutes, 2-3 times a day.  If you have a plaster or fiberglass cast:  Do not try to scratch the skin under the cast using sharp or pointed objects.  Check the skin around the cast every day. You may put lotion on any red or sore areas.  Keep your cast dry and clean.  If you have a plaster splint:  Wear the splint as directed.  You may loosen the elastic around the splint if your toes become numb, tingle, or turn cold or blue.  Do not put pressure on any part of your cast or splint until it is fully hardened, because it may deform.  Your cast or splint can be protected during bathing with a plastic bag. Do not lower the cast or splint into water.  Use crutches as directed.  Only take  over-the-counter or prescription medicines for pain, discomfort, or fever as directed by your health care provider.  Follow all instructions given to you by your health care provider.  Make and keep all follow-up appointments. SEEK MEDICAL CARE IF:  Your pain is becoming worse rather than better or is not controlled with medicines.  You have increased swelling or redness in the foot.  You begin to lose feeling in your foot or toes. SEEK IMMEDIATE MEDICAL CARE IF:  You develop a cold or blue foot or toes on the injured side.  You develop severe pain in your injured leg, especially if the pain is increased with movement of your toes. MAKE SURE YOU:  Understand these instructions.  Will watch your condition.  Will get help right away if you are not doing well or get worse. Document Released: 05/12/2002 Document Revised: 06/10/2013 Document Reviewed: 04/01/2013 [facility] Patient Information 2015 [place], [place]. This information is not intended to replace advice given to you by your health care provider. Make sure you discuss any questions you have with your health care provider.

## 2015-03-23 DIAGNOSIS — S12100A Unspecified displaced fracture of second cervical vertebra, initial encounter for closed fracture: Secondary | ICD-10-CM | POA: Insufficient documentation

## 2015-03-30 ENCOUNTER — Other Ambulatory Visit (HOSPITAL_COMMUNITY): Payer: Self-pay | Admitting: Internal Medicine

## 2015-03-30 ENCOUNTER — Other Ambulatory Visit: Payer: Self-pay | Admitting: Internal Medicine

## 2015-03-30 DIAGNOSIS — R079 Chest pain, unspecified: Secondary | ICD-10-CM

## 2015-03-31 ENCOUNTER — Ambulatory Visit (HOSPITAL_COMMUNITY)
Admission: RE | Admit: 2015-03-31 | Discharge: 2015-03-31 | Disposition: A | Discharge status: Home / Self Care | Payer: 59 | Source: Ambulatory Visit | Attending: Internal Medicine | Admitting: Internal Medicine

## 2015-03-31 DIAGNOSIS — R079 Chest pain, unspecified: Secondary | ICD-10-CM | POA: Insufficient documentation

## 2015-03-31 DIAGNOSIS — S20212A Contusion of left front wall of thorax, initial encounter: Secondary | ICD-10-CM | POA: Diagnosis not present

## 2015-05-03 ENCOUNTER — Other Ambulatory Visit: Payer: Self-pay | Admitting: Neurosurgery

## 2015-05-03 DIAGNOSIS — S12100D Unspecified displaced fracture of second cervical vertebra, subsequent encounter for fracture with routine healing: Secondary | ICD-10-CM

## 2015-05-14 ENCOUNTER — Ambulatory Visit
Admission: RE | Admit: 2015-05-14 | Discharge: 2015-05-14 | Disposition: A | Discharge status: Home / Self Care | Payer: 59 | Source: Ambulatory Visit | Attending: Neurosurgery | Admitting: Neurosurgery

## 2015-05-14 DIAGNOSIS — S12100D Unspecified displaced fracture of second cervical vertebra, subsequent encounter for fracture with routine healing: Secondary | ICD-10-CM

## 2015-05-23 ENCOUNTER — Other Ambulatory Visit (HOSPITAL_COMMUNITY): Payer: Self-pay | Admitting: Internal Medicine

## 2015-05-23 DIAGNOSIS — N6310 Unspecified lump in the right breast, unspecified quadrant: Secondary | ICD-10-CM

## 2015-05-24 ENCOUNTER — Encounter (HOSPITAL_COMMUNITY): Payer: 59

## 2015-05-31 ENCOUNTER — Ambulatory Visit (HOSPITAL_COMMUNITY)
Admission: RE | Admit: 2015-05-31 | Discharge: 2015-05-31 | Disposition: A | Discharge status: Home / Self Care | Payer: 59 | Source: Ambulatory Visit | Attending: Internal Medicine | Admitting: Internal Medicine

## 2015-05-31 ENCOUNTER — Other Ambulatory Visit: Payer: Self-pay | Admitting: Internal Medicine

## 2015-05-31 ENCOUNTER — Other Ambulatory Visit (HOSPITAL_COMMUNITY): Payer: Self-pay | Admitting: Internal Medicine

## 2015-05-31 DIAGNOSIS — N6310 Unspecified lump in the right breast, unspecified quadrant: Secondary | ICD-10-CM

## 2015-05-31 DIAGNOSIS — N63 Unspecified lump in breast: Secondary | ICD-10-CM | POA: Diagnosis not present

## 2015-05-31 DIAGNOSIS — R921 Mammographic calcification found on diagnostic imaging of breast: Secondary | ICD-10-CM

## 2015-06-08 ENCOUNTER — Ambulatory Visit (INDEPENDENT_AMBULATORY_CARE_PROVIDER_SITE_OTHER): Payer: 59 | Admitting: Urology

## 2015-06-08 DIAGNOSIS — R310 Gross hematuria: Secondary | ICD-10-CM

## 2015-06-10 ENCOUNTER — Ambulatory Visit
Admission: RE | Admit: 2015-06-10 | Discharge: 2015-06-10 | Disposition: A | Discharge status: Home / Self Care | Payer: 59 | Source: Ambulatory Visit | Attending: Internal Medicine | Admitting: Internal Medicine

## 2015-06-10 DIAGNOSIS — R921 Mammographic calcification found on diagnostic imaging of breast: Secondary | ICD-10-CM

## 2015-06-22 ENCOUNTER — Ambulatory Visit (INDEPENDENT_AMBULATORY_CARE_PROVIDER_SITE_OTHER): Payer: 59 | Admitting: Urology

## 2015-06-22 DIAGNOSIS — R310 Gross hematuria: Secondary | ICD-10-CM

## 2015-09-07 DIAGNOSIS — Z79891 Long term (current) use of opiate analgesic: Secondary | ICD-10-CM | POA: Diagnosis not present

## 2015-09-07 DIAGNOSIS — G894 Chronic pain syndrome: Secondary | ICD-10-CM | POA: Diagnosis not present

## 2015-09-07 DIAGNOSIS — M542 Cervicalgia: Secondary | ICD-10-CM | POA: Diagnosis not present

## 2015-09-07 DIAGNOSIS — M5033 Other cervical disc degeneration, cervicothoracic region: Secondary | ICD-10-CM | POA: Diagnosis not present

## 2015-09-07 DIAGNOSIS — G890 Central pain syndrome: Secondary | ICD-10-CM | POA: Diagnosis not present

## 2015-09-29 DIAGNOSIS — F0781 Postconcussional syndrome: Secondary | ICD-10-CM | POA: Diagnosis not present

## 2015-09-29 DIAGNOSIS — S12100S Unspecified displaced fracture of second cervical vertebra, sequela: Secondary | ICD-10-CM | POA: Diagnosis not present

## 2015-09-29 DIAGNOSIS — G603 Idiopathic progressive neuropathy: Secondary | ICD-10-CM | POA: Diagnosis not present

## 2015-09-29 DIAGNOSIS — M5412 Radiculopathy, cervical region: Secondary | ICD-10-CM | POA: Diagnosis not present

## 2015-10-04 MED FILL — ROSUVASTATIN CALCIUM 20 MG: 20 | 90 days supply | Qty: 90 | Fill #1

## 2015-10-04 MED FILL — LEVOTHYROXINE 150 MCG TAB: 150 | 90 days supply | Qty: 90 | Fill #1

## 2015-10-05 DIAGNOSIS — G894 Chronic pain syndrome: Secondary | ICD-10-CM | POA: Diagnosis not present

## 2015-10-05 DIAGNOSIS — M542 Cervicalgia: Secondary | ICD-10-CM | POA: Diagnosis not present

## 2015-10-05 DIAGNOSIS — G890 Central pain syndrome: Secondary | ICD-10-CM | POA: Diagnosis not present

## 2015-10-05 DIAGNOSIS — Z79891 Long term (current) use of opiate analgesic: Secondary | ICD-10-CM | POA: Diagnosis not present

## 2015-10-07 DIAGNOSIS — E782 Mixed hyperlipidemia: Secondary | ICD-10-CM | POA: Diagnosis not present

## 2015-10-07 DIAGNOSIS — I10 Essential (primary) hypertension: Secondary | ICD-10-CM | POA: Diagnosis not present

## 2015-10-07 DIAGNOSIS — R7301 Impaired fasting glucose: Secondary | ICD-10-CM | POA: Diagnosis not present

## 2015-10-07 DIAGNOSIS — E039 Hypothyroidism, unspecified: Secondary | ICD-10-CM | POA: Diagnosis not present

## 2015-10-07 MED FILL — LEVOTHYROXINE 125 MCG TAB: 125 | 30 days supply | Qty: 30 | Fill #0

## 2015-10-07 MED FILL — VIT D2 1.25 MG (50,000 UNIT: 1.25 MG | 84 days supply | Qty: 12 | Fill #0

## 2015-10-07 MED FILL — clonazePAM 1 MG TABS: 1 | 30 days supply | Qty: 90 | Fill #0

## 2015-10-10 MED FILL — GABAPENTIN 300 MG CAPSULE: 300 | 30 days supply | Qty: 180 | Fill #0

## 2015-10-19 DIAGNOSIS — E782 Mixed hyperlipidemia: Secondary | ICD-10-CM | POA: Diagnosis not present

## 2015-10-19 MED FILL — BENZONATATE 100 MG CAPSULE: 100 | 7 days supply | Qty: 40 | Fill #0

## 2015-10-28 DIAGNOSIS — F0781 Postconcussional syndrome: Secondary | ICD-10-CM | POA: Diagnosis not present

## 2015-10-28 DIAGNOSIS — G603 Idiopathic progressive neuropathy: Secondary | ICD-10-CM | POA: Diagnosis not present

## 2015-10-28 DIAGNOSIS — M5412 Radiculopathy, cervical region: Secondary | ICD-10-CM | POA: Diagnosis not present

## 2015-10-31 DIAGNOSIS — G894 Chronic pain syndrome: Secondary | ICD-10-CM | POA: Diagnosis not present

## 2015-10-31 DIAGNOSIS — Z79891 Long term (current) use of opiate analgesic: Secondary | ICD-10-CM | POA: Diagnosis not present

## 2015-10-31 DIAGNOSIS — M545 Low back pain: Secondary | ICD-10-CM | POA: Diagnosis not present

## 2015-10-31 DIAGNOSIS — M25512 Pain in left shoulder: Secondary | ICD-10-CM | POA: Diagnosis not present

## 2015-11-01 DIAGNOSIS — H5203 Hypermetropia, bilateral: Secondary | ICD-10-CM | POA: Diagnosis not present

## 2015-11-03 MED FILL — AMLODIPINE-OLMESARTAN 10-40: 10-40 | 90 days supply | Qty: 90 | Fill #0

## 2015-11-05 DIAGNOSIS — I959 Hypotension, unspecified: Secondary | ICD-10-CM | POA: Diagnosis not present

## 2015-11-05 DIAGNOSIS — N63 Unspecified lump in breast: Secondary | ICD-10-CM | POA: Diagnosis not present

## 2015-11-07 DIAGNOSIS — M5412 Radiculopathy, cervical region: Secondary | ICD-10-CM | POA: Diagnosis not present

## 2015-11-07 DIAGNOSIS — F0781 Postconcussional syndrome: Secondary | ICD-10-CM | POA: Diagnosis not present

## 2015-11-07 DIAGNOSIS — G603 Idiopathic progressive neuropathy: Secondary | ICD-10-CM | POA: Diagnosis not present

## 2015-11-09 ENCOUNTER — Other Ambulatory Visit (HOSPITAL_COMMUNITY): Payer: Self-pay | Admitting: Internal Medicine

## 2015-11-09 DIAGNOSIS — N6310 Unspecified lump in the right breast, unspecified quadrant: Secondary | ICD-10-CM

## 2015-11-11 DIAGNOSIS — I959 Hypotension, unspecified: Secondary | ICD-10-CM | POA: Diagnosis not present

## 2015-11-15 ENCOUNTER — Ambulatory Visit (HOSPITAL_COMMUNITY)
Admission: RE | Admit: 2015-11-15 | Discharge: 2015-11-15 | Disposition: A | Discharge status: Home / Self Care | Payer: 59 | Source: Ambulatory Visit | Attending: Internal Medicine | Admitting: Internal Medicine

## 2015-11-15 DIAGNOSIS — N63 Unspecified lump in breast: Secondary | ICD-10-CM | POA: Diagnosis not present

## 2015-11-15 DIAGNOSIS — N6310 Unspecified lump in the right breast, unspecified quadrant: Secondary | ICD-10-CM
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
## 2015-12-02 DIAGNOSIS — G5603 Carpal tunnel syndrome, bilateral upper limbs: Secondary | ICD-10-CM | POA: Diagnosis not present
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
## 2015-12-05 DIAGNOSIS — S12100S Unspecified displaced fracture of second cervical vertebra, sequela: Secondary | ICD-10-CM | POA: Diagnosis not present

## 2015-12-05 DIAGNOSIS — F0781 Postconcussional syndrome: Secondary | ICD-10-CM | POA: Diagnosis not present

## 2015-12-05 DIAGNOSIS — M5481 Occipital neuralgia: Secondary | ICD-10-CM | POA: Diagnosis not present

## 2015-12-05 DIAGNOSIS — G603 Idiopathic progressive neuropathy: Secondary | ICD-10-CM | POA: Diagnosis not present

## 2015-12-05 DIAGNOSIS — M5412 Radiculopathy, cervical region: Secondary | ICD-10-CM | POA: Diagnosis not present

## 2015-12-07 DIAGNOSIS — M5033 Other cervical disc degeneration, cervicothoracic region: Secondary | ICD-10-CM | POA: Diagnosis not present

## 2015-12-07 DIAGNOSIS — G894 Chronic pain syndrome: Secondary | ICD-10-CM | POA: Diagnosis not present

## 2015-12-07 DIAGNOSIS — M25512 Pain in left shoulder: Secondary | ICD-10-CM | POA: Diagnosis not present

## 2015-12-20 DIAGNOSIS — G5603 Carpal tunnel syndrome, bilateral upper limbs: Secondary | ICD-10-CM | POA: Diagnosis not present

## 2015-12-23 DIAGNOSIS — G5603 Carpal tunnel syndrome, bilateral upper limbs: Secondary | ICD-10-CM | POA: Diagnosis not present

## 2016-01-04 DIAGNOSIS — M25512 Pain in left shoulder: Secondary | ICD-10-CM | POA: Diagnosis not present

## 2016-01-04 DIAGNOSIS — M5033 Other cervical disc degeneration, cervicothoracic region: Secondary | ICD-10-CM | POA: Diagnosis not present

## 2016-01-04 DIAGNOSIS — M545 Low back pain: Secondary | ICD-10-CM | POA: Diagnosis not present

## 2016-01-04 DIAGNOSIS — M542 Cervicalgia: Secondary | ICD-10-CM | POA: Diagnosis not present

## 2016-01-04 DIAGNOSIS — G894 Chronic pain syndrome: Secondary | ICD-10-CM | POA: Diagnosis not present

## 2016-01-05 DIAGNOSIS — M5412 Radiculopathy, cervical region: Secondary | ICD-10-CM | POA: Diagnosis not present

## 2016-01-05 DIAGNOSIS — G603 Idiopathic progressive neuropathy: Secondary | ICD-10-CM | POA: Diagnosis not present

## 2016-01-05 DIAGNOSIS — F0781 Postconcussional syndrome: Secondary | ICD-10-CM | POA: Diagnosis not present

## 2016-01-05 DIAGNOSIS — M5481 Occipital neuralgia: Secondary | ICD-10-CM | POA: Diagnosis not present

## 2016-01-06 DIAGNOSIS — G5603 Carpal tunnel syndrome, bilateral upper limbs: Secondary | ICD-10-CM | POA: Diagnosis not present

## 2016-01-09 MED FILL — VIT D2 1.25 MG (50,000 UNIT: 1.25 MG | 84 days supply | Qty: 12 | Fill #1

## 2016-01-09 MED FILL — LEVOTHYROXINE 125 MCG TAB: 125 | 30 days supply | Qty: 30 | Fill #1

## 2016-01-09 MED FILL — GABAPENTIN 300 MG CAPSULE: 300 | 30 days supply | Qty: 180 | Fill #1

## 2016-01-09 MED FILL — clonazePAM 1 MG TABS: 1 | 30 days supply | Qty: 90 | Fill #1

## 2016-01-12 MED FILL — BENZONATATE 100 MG CAPSULE: 100 | 7 days supply | Qty: 40 | Fill #0

## 2016-01-12 MED FILL — CITALOPRAM HBR 20 MG TABLET: 20 | 90 days supply | Qty: 180 | Fill #0

## 2016-01-13 LAB — BASIC METABOLIC PANEL: Glucose: 118 mg/dL

## 2016-02-03 DIAGNOSIS — M545 Low back pain: Secondary | ICD-10-CM | POA: Diagnosis not present

## 2016-02-03 DIAGNOSIS — G541 Lumbosacral plexus disorders: Secondary | ICD-10-CM | POA: Diagnosis not present

## 2016-02-03 DIAGNOSIS — G894 Chronic pain syndrome: Secondary | ICD-10-CM | POA: Diagnosis not present

## 2016-02-03 DIAGNOSIS — G603 Idiopathic progressive neuropathy: Secondary | ICD-10-CM | POA: Diagnosis not present

## 2016-02-03 DIAGNOSIS — M542 Cervicalgia: Secondary | ICD-10-CM | POA: Diagnosis not present

## 2016-02-03 DIAGNOSIS — M5412 Radiculopathy, cervical region: Secondary | ICD-10-CM | POA: Diagnosis not present

## 2016-02-04 DIAGNOSIS — Z79891 Long term (current) use of opiate analgesic: Secondary | ICD-10-CM | POA: Diagnosis not present

## 2016-02-06 DIAGNOSIS — G5603 Carpal tunnel syndrome, bilateral upper limbs: Secondary | ICD-10-CM | POA: Diagnosis not present

## 2016-02-10 ENCOUNTER — Other Ambulatory Visit: Payer: Self-pay | Admitting: Orthopedic Surgery

## 2016-02-14 MED FILL — LEVOTHYROXINE 125 MCG TAB: 125 | 30 days supply | Qty: 30 | Fill #2

## 2016-02-14 MED FILL — GABAPENTIN 300 MG CAPSULE: 300 | 30 days supply | Qty: 180 | Fill #2

## 2016-02-16 DIAGNOSIS — R7301 Impaired fasting glucose: Secondary | ICD-10-CM | POA: Diagnosis not present

## 2016-02-16 DIAGNOSIS — E039 Hypothyroidism, unspecified: Secondary | ICD-10-CM | POA: Diagnosis not present

## 2016-02-16 DIAGNOSIS — M5412 Radiculopathy, cervical region: Secondary | ICD-10-CM | POA: Diagnosis not present

## 2016-02-16 DIAGNOSIS — M5481 Occipital neuralgia: Secondary | ICD-10-CM | POA: Diagnosis not present

## 2016-02-16 DIAGNOSIS — E782 Mixed hyperlipidemia: Secondary | ICD-10-CM | POA: Diagnosis not present

## 2016-02-16 DIAGNOSIS — G603 Idiopathic progressive neuropathy: Secondary | ICD-10-CM | POA: Diagnosis not present

## 2016-02-16 DIAGNOSIS — I10 Essential (primary) hypertension: Secondary | ICD-10-CM | POA: Diagnosis not present

## 2016-02-16 DIAGNOSIS — F0781 Postconcussional syndrome: Secondary | ICD-10-CM | POA: Diagnosis not present

## 2016-02-16 MED FILL — ROSUVASTATIN CALCIUM 20 MG: 20 | 90 days supply | Qty: 90 | Fill #0

## 2016-02-21 DIAGNOSIS — E559 Vitamin D deficiency, unspecified: Secondary | ICD-10-CM | POA: Diagnosis not present

## 2016-02-21 DIAGNOSIS — I10 Essential (primary) hypertension: Secondary | ICD-10-CM | POA: Diagnosis not present

## 2016-02-21 DIAGNOSIS — F411 Generalized anxiety disorder: Secondary | ICD-10-CM | POA: Diagnosis not present

## 2016-02-21 DIAGNOSIS — E039 Hypothyroidism, unspecified: Secondary | ICD-10-CM | POA: Diagnosis not present

## 2016-02-21 DIAGNOSIS — E782 Mixed hyperlipidemia: Secondary | ICD-10-CM | POA: Diagnosis not present

## 2016-02-21 DIAGNOSIS — R7301 Impaired fasting glucose: Secondary | ICD-10-CM | POA: Diagnosis not present

## 2016-03-12 DIAGNOSIS — M545 Low back pain: Secondary | ICD-10-CM | POA: Diagnosis not present

## 2016-03-12 DIAGNOSIS — G894 Chronic pain syndrome: Secondary | ICD-10-CM | POA: Diagnosis not present

## 2016-03-12 DIAGNOSIS — M25512 Pain in left shoulder: Secondary | ICD-10-CM | POA: Diagnosis not present

## 2016-03-12 DIAGNOSIS — Z79891 Long term (current) use of opiate analgesic: Secondary | ICD-10-CM | POA: Diagnosis not present

## 2016-03-13 ENCOUNTER — Encounter (HOSPITAL_BASED_OUTPATIENT_CLINIC_OR_DEPARTMENT_OTHER): Payer: Self-pay | Admitting: *Deleted

## 2016-03-26 ENCOUNTER — Encounter (HOSPITAL_BASED_OUTPATIENT_CLINIC_OR_DEPARTMENT_OTHER): Payer: Self-pay

## 2016-03-27 ENCOUNTER — Ambulatory Visit (HOSPITAL_BASED_OUTPATIENT_CLINIC_OR_DEPARTMENT_OTHER)
Admission: RE | Admit: 2016-03-27 | Discharge: 2016-03-27 | Disposition: A | Discharge status: Home / Self Care | Payer: 59 | Source: Ambulatory Visit | Attending: Orthopedic Surgery | Admitting: Orthopedic Surgery

## 2016-03-27 ENCOUNTER — Ambulatory Visit (HOSPITAL_BASED_OUTPATIENT_CLINIC_OR_DEPARTMENT_OTHER): Payer: 59 | Admitting: Anesthesiology

## 2016-03-27 ENCOUNTER — Encounter (HOSPITAL_BASED_OUTPATIENT_CLINIC_OR_DEPARTMENT_OTHER): Payer: Self-pay | Admitting: Orthopedic Surgery

## 2016-03-27 ENCOUNTER — Encounter (HOSPITAL_BASED_OUTPATIENT_CLINIC_OR_DEPARTMENT_OTHER)
Admission: RE | Disposition: A | Discharge status: Home / Self Care | Payer: Self-pay | Source: Ambulatory Visit | Attending: Orthopedic Surgery

## 2016-03-27 DIAGNOSIS — J449 Chronic obstructive pulmonary disease, unspecified: Secondary | ICD-10-CM | POA: Diagnosis not present

## 2016-03-27 DIAGNOSIS — G5602 Carpal tunnel syndrome, left upper limb: Secondary | ICD-10-CM | POA: Diagnosis not present

## 2016-03-27 DIAGNOSIS — E039 Hypothyroidism, unspecified: Secondary | ICD-10-CM | POA: Diagnosis not present

## 2016-03-27 DIAGNOSIS — E785 Hyperlipidemia, unspecified: Secondary | ICD-10-CM | POA: Insufficient documentation

## 2016-03-27 DIAGNOSIS — F17210 Nicotine dependence, cigarettes, uncomplicated: Secondary | ICD-10-CM | POA: Insufficient documentation

## 2016-03-27 DIAGNOSIS — G629 Polyneuropathy, unspecified: Secondary | ICD-10-CM | POA: Diagnosis not present

## 2016-03-27 DIAGNOSIS — K219 Gastro-esophageal reflux disease without esophagitis: Secondary | ICD-10-CM | POA: Insufficient documentation

## 2016-03-27 DIAGNOSIS — I10 Essential (primary) hypertension: Secondary | ICD-10-CM | POA: Diagnosis not present

## 2016-03-27 HISTORY — DX: Depression, unspecified: F32.A

## 2016-03-27 HISTORY — DX: Other chronic pain: G89.29

## 2016-03-27 HISTORY — DX: Headache, unspecified: R51.9

## 2016-03-27 HISTORY — DX: Chronic obstructive pulmonary disease, unspecified: J44.9

## 2016-03-27 HISTORY — PX: CARPAL TUNNEL RELEASE: SHX101

## 2016-03-27 HISTORY — PX: HYPOTHENAR FAT PAD TRANSFER: SHX6408

## 2016-03-27 HISTORY — DX: Anxiety disorder, unspecified: F41.9

## 2016-03-27 HISTORY — DX: Major depressive disorder, single episode, unspecified: F32.9

## 2016-03-27 HISTORY — DX: Unspecified convulsions: R56.9

## 2016-03-27 HISTORY — DX: Headache: R51

## 2016-03-27 SURGERY — CARPAL TUNNEL RELEASE
Anesthesia: General | Site: Wrist | Laterality: Left

## 2016-03-27 MED ORDER — BUPIVACAINE HCL (PF) 0.25 % IJ SOLN
INTRAMUSCULAR | Status: DC | PRN
  Administered 2016-03-27: 8 mL

## 2016-03-27 MED ORDER — ONDANSETRON HCL 4 MG/2ML IJ SOLN
INTRAMUSCULAR | Status: DC | PRN
  Administered 2016-03-27: 4 mg via INTRAVENOUS

## 2016-03-27 MED ORDER — CEFAZOLIN SODIUM-DEXTROSE 2-4 GM/100ML-% IV SOLN: 2.0000 g | INTRAVENOUS | Status: DC

## 2016-03-27 MED ORDER — DEXAMETHASONE SODIUM PHOSPHATE 4 MG/ML IJ SOLN
INTRAMUSCULAR | Status: DC | PRN
  Administered 2016-03-27: 10 mg via INTRAVENOUS

## 2016-03-27 MED ORDER — DEXAMETHASONE SODIUM PHOSPHATE 10 MG/ML IJ SOLN
INTRAMUSCULAR | Status: AC
  Filled 2016-03-27: qty 1

## 2016-03-27 MED ORDER — FENTANYL CITRATE (PF) 100 MCG/2ML IJ SOLN
INTRAMUSCULAR | Status: AC
  Filled 2016-03-27: qty 2

## 2016-03-27 MED ORDER — MEPERIDINE HCL 25 MG/ML IJ SOLN: 6.2500 mg | INTRAMUSCULAR | Status: DC | PRN

## 2016-03-27 MED ORDER — PROPOFOL 10 MG/ML IV BOLUS
INTRAVENOUS | Status: AC
  Filled 2016-03-27: qty 40

## 2016-03-27 MED ORDER — OXYCODONE HCL 5 MG PO TABS: 5.0000 mg | ORAL_TABLET | Freq: Once | ORAL | Status: DC | PRN

## 2016-03-27 MED ORDER — FENTANYL CITRATE (PF) 100 MCG/2ML IJ SOLN
INTRAMUSCULAR | Status: DC | PRN
  Administered 2016-03-27: 100 ug via INTRAVENOUS

## 2016-03-27 MED ORDER — CHLORHEXIDINE GLUCONATE 4 % EX LIQD: 60.0000 mL | Freq: Once | CUTANEOUS | Status: DC

## 2016-03-27 MED ORDER — SCOPOLAMINE 1 MG/3DAYS TD PT72: 1.0000 | MEDICATED_PATCH | Freq: Once | TRANSDERMAL | Status: DC | PRN

## 2016-03-27 MED ORDER — PROPOFOL 10 MG/ML IV BOLUS
INTRAVENOUS | Status: DC | PRN
  Administered 2016-03-27: 200 mg via INTRAVENOUS

## 2016-03-27 MED ORDER — CEFAZOLIN SODIUM 1 G IJ SOLR
INTRAMUSCULAR | Status: AC
  Filled 2016-03-27: qty 20

## 2016-03-27 MED ORDER — CEFAZOLIN SODIUM-DEXTROSE 2-3 GM-% IV SOLR
INTRAVENOUS | Status: DC | PRN
  Administered 2016-03-27: 2 g via INTRAVENOUS

## 2016-03-27 MED ORDER — BUPIVACAINE HCL (PF) 0.25 % IJ SOLN
INTRAMUSCULAR | Status: AC
  Filled 2016-03-27: qty 120

## 2016-03-27 MED ORDER — MIDAZOLAM HCL 2 MG/2ML IJ SOLN
INTRAMUSCULAR | Status: AC
  Filled 2016-03-27: qty 2

## 2016-03-27 MED ORDER — HYDROMORPHONE HCL 1 MG/ML IJ SOLN: 0.2500 mg | INTRAMUSCULAR | Status: DC | PRN

## 2016-03-27 MED ORDER — LACTATED RINGERS IV SOLN
INTRAVENOUS | Status: DC
  Administered 2016-03-27: 09:00:00 via INTRAVENOUS

## 2016-03-27 MED ORDER — METOPROLOL TARTRATE 5 MG/5ML IV SOLN
INTRAVENOUS | Status: AC
  Filled 2016-03-27: qty 5

## 2016-03-27 MED ORDER — HYDROCODONE-ACETAMINOPHEN 5-325 MG PO TABS: 1.0000 | ORAL_TABLET | Freq: Four times a day (QID) | ORAL | 0 refills | Status: DC | PRN

## 2016-03-27 MED ORDER — OXYCODONE HCL 5 MG/5ML PO SOLN: 5.0000 mg | Freq: Once | ORAL | Status: DC | PRN

## 2016-03-27 MED ORDER — GLYCOPYRROLATE 0.2 MG/ML IJ SOLN
0.2000 mg | Freq: Once | INTRAMUSCULAR | Status: AC | PRN
  Administered 2016-03-27: 0.2 mg via INTRAVENOUS

## 2016-03-27 MED ORDER — ONDANSETRON HCL 4 MG/2ML IJ SOLN
INTRAMUSCULAR | Status: AC
  Filled 2016-03-27: qty 2

## 2016-03-27 MED ORDER — LACTATED RINGERS IV SOLN
INTRAVENOUS | Status: DC | PRN
  Administered 2016-03-27: 08:00:00 via INTRAVENOUS

## 2016-03-27 MED ORDER — FENTANYL CITRATE (PF) 100 MCG/2ML IJ SOLN: 50.0000 ug | INTRAMUSCULAR | Status: DC | PRN

## 2016-03-27 MED ORDER — CEFAZOLIN SODIUM 1 G IJ SOLR
INTRAMUSCULAR | Status: AC
  Filled 2016-03-27: qty 10

## 2016-03-27 MED ORDER — METOPROLOL TARTRATE 5 MG/5ML IV SOLN
INTRAVENOUS | Status: DC | PRN
  Administered 2016-03-27: 2.5 mg via INTRAVENOUS

## 2016-03-27 MED ORDER — MIDAZOLAM HCL 5 MG/5ML IJ SOLN
INTRAMUSCULAR | Status: DC | PRN
  Administered 2016-03-27: 2 mg via INTRAVENOUS

## 2016-03-27 MED ORDER — GLYCOPYRROLATE 0.2 MG/ML IV SOSY
PREFILLED_SYRINGE | INTRAVENOUS | Status: AC
  Filled 2016-03-27: qty 3

## 2016-03-27 MED ORDER — LIDOCAINE 2% (20 MG/ML) 5 ML SYRINGE
INTRAMUSCULAR | Status: AC
  Filled 2016-03-27: qty 5

## 2016-03-27 MED ORDER — MIDAZOLAM HCL 2 MG/2ML IJ SOLN: 1.0000 mg | INTRAMUSCULAR | Status: DC | PRN

## 2016-03-27 MED ORDER — LIDOCAINE HCL (CARDIAC) 20 MG/ML IV SOLN
INTRAVENOUS | Status: DC | PRN
  Administered 2016-03-27: 50 mg via INTRAVENOUS

## 2016-03-27 MED ORDER — SODIUM CHLORIDE 0.9 % IJ SOLN
INTRAMUSCULAR | Status: AC
  Filled 2016-03-27: qty 10

## 2016-03-27 SURGICAL SUPPLY — 42 items
BLADE SURG 15 STRL LF DISP TIS (BLADE) ×1 IMPLANT
BLADE SURG 15 STRL SS (BLADE) ×3
BNDG CMPR 9X4 STRL LF SNTH (GAUZE/BANDAGES/DRESSINGS) ×1
BNDG COHESIVE 3X5 TAN STRL LF (GAUZE/BANDAGES/DRESSINGS) ×3 IMPLANT
BNDG ESMARK 4X9 LF (GAUZE/BANDAGES/DRESSINGS) ×2 IMPLANT
BNDG GAUZE ELAST 4 BULKY (GAUZE/BANDAGES/DRESSINGS) ×3 IMPLANT
CHLORAPREP W/TINT 26ML (MISCELLANEOUS) ×3 IMPLANT
CORDS BIPOLAR (ELECTRODE) ×3 IMPLANT
COVER BACK TABLE 60X90IN (DRAPES) ×3 IMPLANT
COVER MAYO STAND STRL (DRAPES) ×3 IMPLANT
CUFF TOURNIQUET SINGLE 18IN (TOURNIQUET CUFF) ×3 IMPLANT
DRAPE EXTREMITY T 121X128X90 (DRAPE) ×3 IMPLANT
DRAPE SURG 17X23 STRL (DRAPES) ×3 IMPLANT
DRSG PAD ABDOMINAL 8X10 ST (GAUZE/BANDAGES/DRESSINGS) ×3 IMPLANT
GAUZE SPONGE 4X4 12PLY STRL (GAUZE/BANDAGES/DRESSINGS) ×3 IMPLANT
GAUZE XEROFORM 1X8 LF (GAUZE/BANDAGES/DRESSINGS) ×3 IMPLANT
GLOVE BIOGEL PI IND STRL 7.0 (GLOVE) IMPLANT
GLOVE BIOGEL PI IND STRL 8.5 (GLOVE) ×1 IMPLANT
GLOVE BIOGEL PI INDICATOR 7.0 (GLOVE) ×2
GLOVE BIOGEL PI INDICATOR 8.5 (GLOVE) ×2
GLOVE ECLIPSE 6.5 STRL STRAW (GLOVE) ×2 IMPLANT
GLOVE SURG ORTHO 8.0 STRL STRW (GLOVE) ×3 IMPLANT
GOWN STRL REUS W/ TWL LRG LVL3 (GOWN DISPOSABLE) ×1 IMPLANT
GOWN STRL REUS W/TWL LRG LVL3 (GOWN DISPOSABLE) ×3
GOWN STRL REUS W/TWL XL LVL3 (GOWN DISPOSABLE) ×3 IMPLANT
NDL PRECISIONGLIDE 27X1.5 (NEEDLE) IMPLANT
NEEDLE PRECISIONGLIDE 27X1.5 (NEEDLE) ×3 IMPLANT
NS IRRIG 1000ML POUR BTL (IV SOLUTION) ×3 IMPLANT
PACK BASIN DAY SURGERY FS (CUSTOM PROCEDURE TRAY) ×3 IMPLANT
PAD CAST 3X4 CTTN HI CHSV (CAST SUPPLIES) ×1 IMPLANT
PADDING CAST ABS 4INX4YD NS (CAST SUPPLIES)
PADDING CAST ABS COTTON 4X4 ST (CAST SUPPLIES) ×1 IMPLANT
PADDING CAST COTTON 3X4 STRL (CAST SUPPLIES) ×3
SPLINT PLASTER CAST XFAST 3X15 (CAST SUPPLIES) IMPLANT
SPLINT PLASTER XTRA FASTSET 3X (CAST SUPPLIES) ×16
STOCKINETTE 4X48 STRL (DRAPES) ×3 IMPLANT
SUT ETHILON 4 0 PS 2 18 (SUTURE) ×3 IMPLANT
SUT VICRYL 4-0 PS2 18IN ABS (SUTURE) ×2 IMPLANT
SYR BULB 3OZ (MISCELLANEOUS) ×3 IMPLANT
SYR CONTROL 10ML LL (SYRINGE) ×2 IMPLANT
TOWEL OR 17X24 6PK STRL BLUE (TOWEL DISPOSABLE) ×3 IMPLANT
UNDERPAD 30X30 (UNDERPADS AND DIAPERS) ×1 IMPLANT

## 2016-03-27 NOTE — Discharge Instructions (Addendum)
[facility] Instructions °Hand Surgery ° °Wound Care: °Keep your hand elevated above the level of your heart.  Do not allow it to dangle by your side.  Keep the dressing dry and do not remove it unless your doctor advises you to do so.  He will usually change it at the time of your post-op visit.  Moving your fingers is advised to stimulate circulation but will depend on the site of your surgery.  If you have a splint applied, your doctor will advise you regarding movement. ° °Activity: °Do not drive or operate machinery today.  Rest today and then you may return to your normal activity and work as indicated by your physician. ° °Diet:  °Drink liquids today or eat a light diet.  You may resume a regular diet tomorrow.   ° ° ° °Post Anesthesia Home Care Instructions ° °Activity: °Get plenty of rest for the remainder of the day. A responsible adult should stay with you for 24 hours following the procedure.  °For the next 24 hours, DO NOT: °-Drive a car °-Operate machinery °-Drink alcoholic beverages °-Take any medication unless instructed by your physician °-Make any legal decisions or sign important papers. ° °Meals: °Start with liquid foods such as gelatin or soup. Progress to regular foods as tolerated. Avoid greasy, spicy, heavy foods. If nausea and/or vomiting occur, drink only clear liquids until the nausea and/or vomiting subsides. Call your physician if vomiting continues. ° °Special Instructions/Symptoms: °Your throat may feel dry or sore from the anesthesia or the breathing tube placed in your throat during surgery. If this causes discomfort, gargle with warm salt water. The discomfort should disappear within 24 hours. ° °If you had a scopolamine patch placed behind your ear for the management of post- operative nausea and/or vomiting: ° °1. The medication in the patch is effective for 72 hours, after which it should be removed.  Wrap patch in a tissue and discard in the trash. Wash hands thoroughly with  soap and water. °2. You may remove the patch earlier than 72 hours if you experience unpleasant side effects which may include dry mouth, dizziness or visual disturbances. °3. Avoid touching the patch. Wash your hands with soap and water after contact with the patch. °  ° °General expectations: °Pain for two to three days. °Fingers may become slightly swollen. ° °Call your doctor if any of the following occur: °Severe pain not relieved by pain medication. °Elevated temperature. °Dressing soaked with blood. °Inability to move fingers. °White or bluish color to fingers. ° °

## 2016-03-27 NOTE — Transfer of Care (Signed)
Immediate Anesthesia Transfer of Care Note  Patient: Elizabeth Brewer  Procedure(s) Performed: Procedure(s): LEFT CARPAL TUNNEL RELEASE (Left) LEFT HYPOTHENAR FAT PAD TRANSFER (Left)  Patient Location: PACU  Anesthesia Type:General  Level of Consciousness: awake and patient cooperative  Airway & Oxygen Therapy: Patient Spontanous Breathing and Patient connected to face mask oxygen  Post-op Assessment: Report given to RN and Post -op Vital signs reviewed and stable  Post vital signs: Reviewed and stable  Last Vitals:  Vitals:   03/27/16 0810  BP: 110/61  Pulse: (!) 50  Resp: 18  Temp: 36.8 C    Last Pain:  Vitals:   03/27/16 0810  TempSrc: Oral  PainSc:       Patients Stated Pain Goal: 3 ([id number])  Complications: No apparent anesthesia complications
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
## 2016-03-27 NOTE — Op Note (Signed)
Elizabeth Brewer, Elizabeth Brewer NO.:  [id number]  MEDICAL RECORD NO.:  [id number]  LOCATION:                                 FACILITY:  PHYSICIAN:  Daryll Brod, M.D.       DATE OF BIRTH:  November 05, 1960  DATE OF PROCEDURE:  03/27/2016 DATE OF DISCHARGE:                              OPERATIVE REPORT   PREOPERATIVE DIAGNOSIS:  Recurrent carpal tunnel syndrome, left hand.  POSTOPERATIVE DIAGNOSIS:  Recurrent carpal tunnel syndrome, left hand.  OPERATION:  Rerelease carpal canal, decompression median nerve, left wrist with hyper-thenar fat pad transfer.  SURGEON:  Daryll Brod, MD  ANESTHESIA:  General with local infiltration.  ANESTHESIOLOGIST:  Lorrene Reid, MD  PLACE OF SURGERY:  [facility].  HISTORY:  The patient is a 55 year old female with a history of carpal tunnel release done approximately a decade ago which has recurred.  She has had recurrence of numbness and tingling.  She has had nerve conductions performed which reveal continuation of carpal tunnel syndrome.  She has not responded to conservative treatment.  PROCEDURE IN DETAIL:  In preoperative area, the patient was seen and the extremity marked by both patient and surgeon.  Antibiotic was given. Risks and complications were again discussed.  PROCEDURE IN DETAIL:  The patient was brought to the operating room, where a general anesthetic was carried out under the direction of Dr. Al Corpus.  She was prepped using ChloraPrep in supine position with the right arm free.  A 3-minute dry time was allowed.  Time-out taken, confirming patient and procedure.  The limb was exsanguinated with an Esmarch bandage.  Tourniquet placed high on the arm was inflated to 250 mmHg.  The prep was allowed to dry.  This was carried proximally and distally beyond the old incision.  The dissection was then carried down through subcutaneous tissue.  Bleeders were electrocauterized with bipolar.  The dissection was carried  proximally allowing visualization of the median nerve proximal.  The forearm fascia was released.  The nerve identified.  The dissection was then carried distally taking care to protect the median nerve.  This was done to the ulnar side. Retractors were placed identifying the ulnar nerve.  The median nerve was then identified and found to have an area of compression.  The hypothenar fat pad transfer was then elevated.  With blunt and sharp dissection, the subcutaneous tissue was dissected free from the overlying at the thenar skin.  The flap was then created taking care to protect the ulnar artery and nerve.  The wound was copiously irrigated with saline.  The hypothenar fat pad was then transferred over and sutured to the radial border of the flexor retinaculum.  This was done with interrupted and figure-of-eight 4-0 Vicryl sutures.  The skin was then closed with interrupted 4-0 nylon sutures.  Local infiltration with 0.25% bupivacaine without epinephrine was then given.  Approximately 8 mL of bupivacaine was injected.  A sterile compressive dressing and volar splint was applied.  On deflation of the tourniquet, all fingers immediately pinked.  She was taken to the recovery room for observation in satisfactory condition.  She will be discharged home to return  to the [facility] in 1 week. She will be discharged on Norco.          ______________________________ Daryll Brod, M.D.     GK/MEDQ  D:  03/27/2016  T:  03/27/2016  Job:  [id number]

## 2016-03-27 NOTE — Brief Op Note (Signed)
03/27/2016  9:23 AM  PATIENT:  Elizabeth Brewer  55 y.o. female  PRE-OPERATIVE DIAGNOSIS:  RECURRENT LEFT CARPAL TUNNEL SYNDROME  POST-OPERATIVE DIAGNOSIS:  RECURRENT LEFT CARPAL TUNNEL SYNDROME  PROCEDURE:  Procedure(s): LEFT CARPAL TUNNEL RELEASE (Left) LEFT HYPOTHENAR FAT PAD TRANSFER (Left)  SURGEON:  Surgeon(s) and Role:    * Daryll Brod, MD - Primary  PHYSICIAN ASSISTANT:   ASSISTANTS: none   ANESTHESIA:   local and general  EBL:  Total I/O In: -  Out: 2 [Blood:2]  BLOOD ADMINISTERED:none  DRAINS: none   LOCAL MEDICATIONS USED:  BUPIVICAINE   SPECIMEN:  No Specimen  DISPOSITION OF SPECIMEN:  N/A  COUNTS:  YES  TOURNIQUET:   Total Tourniquet Time Documented: Upper Arm (Left) - 25 minutes Total: Upper Arm (Left) - 25 minutes   DICTATION: .Other Dictation: Dictation Number [id number]  PLAN OF CARE: Discharge to home after PACU  PATIENT DISPOSITION:  PACU - hemodynamically stable.

## 2016-03-27 NOTE — Anesthesia Postprocedure Evaluation (Signed)
Anesthesia Post Note  Patient: Elizabeth Brewer  Procedure(s) Performed: Procedure(s) (LRB): LEFT CARPAL TUNNEL RELEASE (Left) LEFT HYPOTHENAR FAT PAD TRANSFER (Left)  Patient location during evaluation: PACU Anesthesia Type: General Level of consciousness: awake and alert Pain management: pain level controlled Vital Signs Assessment: post-procedure vital signs reviewed and stable Respiratory status: spontaneous breathing, nonlabored ventilation and respiratory function stable Cardiovascular status: blood pressure returned to baseline and stable Postop Assessment: no signs of nausea or vomiting Anesthetic complications: no    Last Vitals:  Vitals:   03/27/16 1000 03/27/16 1031  BP: (!) 108/57 130/73  Pulse: 67 63  Resp: 13 18  Temp:  36.7 C    Last Pain:  Vitals:   03/27/16 1031  TempSrc:   PainSc: 0-No pain                 [name] A

## 2016-03-27 NOTE — Anesthesia Procedure Notes (Signed)
Procedure Name: LMA Insertion Date/Time: 03/27/2016 8:40 AM Performed by: Toula Moos L Pre-anesthesia Checklist: Patient identified, Emergency Drugs available, Suction available, Patient being monitored and Timeout performed Patient Re-evaluated:Patient Re-evaluated prior to inductionOxygen Delivery Method: Circle system utilized Preoxygenation: Pre-oxygenation with 100% oxygen Intubation Type: IV induction Ventilation: Mask ventilation without difficulty LMA: LMA inserted LMA Size: 4.0 Number of attempts: 1 Airway Equipment and Method: Bite block Placement Confirmation: positive ETCO2 Tube secured with: Tape Dental Injury: Teeth and Oropharynx as per pre-operative assessment

## 2016-03-27 NOTE — Anesthesia Preprocedure Evaluation (Signed)
Anesthesia Evaluation  Patient identified by MRN, date of birth, ID band Patient awake    Reviewed: Allergy & Precautions, NPO status , Patient's Chart, lab work & pertinent test results  Airway Mallampati: I  TM Distance: >3 FB Neck ROM: Full    Dental  (+) Upper Dentures, Dental Advisory Given   Pulmonary COPD, Current Smoker,    breath sounds clear to auscultation       Cardiovascular  Rhythm:Regular Rate:Normal     Neuro/Psych    GI/Hepatic GERD  ,  Endo/Other  Hypothyroidism   Renal/GU      Musculoskeletal   Abdominal   Peds  Hematology   Anesthesia Other Findings   Reproductive/Obstetrics                             Anesthesia Physical Anesthesia Plan  ASA: III  Anesthesia Plan: General   Post-op Pain Management:    Induction: Intravenous  Airway Management Planned: LMA  Additional Equipment:   Intra-op Plan:   Post-operative Plan: Extubation in OR  Informed Consent: I have reviewed the patients History and Physical, chart, labs and discussed the procedure including the risks, benefits and alternatives for the proposed anesthesia with the patient or authorized representative who has indicated his/her understanding and acceptance.   Dental advisory given  Plan Discussed with: CRNA, Anesthesiologist and Surgeon  Anesthesia Plan Comments:         Anesthesia Quick Evaluation

## 2016-03-27 NOTE — Op Note (Signed)
Dictation number: [id number]

## 2016-03-28 MED FILL — VIT D2 1.25 MG (50,000 UNIT: 1.25 MG | 84 days supply | Qty: 12 | Fill #2

## 2016-03-28 MED FILL — GABAPENTIN 300 MG CAPSULE: 300 | 30 days supply | Qty: 180 | Fill #3

## 2016-03-28 MED FILL — clonazePAM 1 MG TABS: 1 | 30 days supply | Qty: 90 | Fill #2

## 2016-03-30 ENCOUNTER — Encounter (HOSPITAL_BASED_OUTPATIENT_CLINIC_OR_DEPARTMENT_OTHER): Payer: Self-pay | Admitting: Orthopedic Surgery

## 2016-04-10 DIAGNOSIS — G894 Chronic pain syndrome: Secondary | ICD-10-CM | POA: Diagnosis not present

## 2016-04-10 DIAGNOSIS — Z79891 Long term (current) use of opiate analgesic: Secondary | ICD-10-CM | POA: Diagnosis not present

## 2016-04-10 DIAGNOSIS — M545 Low back pain: Secondary | ICD-10-CM | POA: Diagnosis not present

## 2016-04-10 DIAGNOSIS — M542 Cervicalgia: Secondary | ICD-10-CM | POA: Diagnosis not present

## 2016-04-12 DIAGNOSIS — G250 Essential tremor: Secondary | ICD-10-CM | POA: Diagnosis not present

## 2016-04-12 DIAGNOSIS — M5481 Occipital neuralgia: Secondary | ICD-10-CM | POA: Diagnosis not present

## 2016-04-12 DIAGNOSIS — G603 Idiopathic progressive neuropathy: Secondary | ICD-10-CM | POA: Diagnosis not present

## 2016-04-12 DIAGNOSIS — M5412 Radiculopathy, cervical region: Secondary | ICD-10-CM | POA: Diagnosis not present

## 2016-04-16 ENCOUNTER — Other Ambulatory Visit: Payer: Self-pay | Admitting: Orthopedic Surgery

## 2016-04-17 ENCOUNTER — Other Ambulatory Visit: Payer: Self-pay | Admitting: Orthopedic Surgery

## 2016-05-16 ENCOUNTER — Encounter (HOSPITAL_BASED_OUTPATIENT_CLINIC_OR_DEPARTMENT_OTHER): Payer: Self-pay | Admitting: *Deleted

## 2016-05-22 ENCOUNTER — Ambulatory Visit (HOSPITAL_BASED_OUTPATIENT_CLINIC_OR_DEPARTMENT_OTHER): Admission: RE | Admit: 2016-05-22 | Payer: 59 | Source: Ambulatory Visit | Admitting: Orthopedic Surgery

## 2016-05-22 SURGERY — ULNAR NERVE DECOMPRESSION/TRANSPOSITION
Anesthesia: Choice | Laterality: Right

## 2016-07-05 ENCOUNTER — Other Ambulatory Visit: Payer: Self-pay | Admitting: Internal Medicine

## 2016-07-05 DIAGNOSIS — N6310 Unspecified lump in the right breast, unspecified quadrant: Secondary | ICD-10-CM

## 2016-07-17 ENCOUNTER — Other Ambulatory Visit (HOSPITAL_COMMUNITY): Payer: Self-pay | Admitting: *Deleted

## 2016-07-17 DIAGNOSIS — N6310 Unspecified lump in the right breast, unspecified quadrant: Secondary | ICD-10-CM

## 2016-07-31 ENCOUNTER — Ambulatory Visit: Payer: Self-pay | Admitting: Family

## 2016-08-06 ENCOUNTER — Ambulatory Visit (INDEPENDENT_AMBULATORY_CARE_PROVIDER_SITE_OTHER): Payer: Medicaid Other | Admitting: Family

## 2016-08-06 ENCOUNTER — Ambulatory Visit (INDEPENDENT_AMBULATORY_CARE_PROVIDER_SITE_OTHER): Payer: Medicaid Other

## 2016-08-06 ENCOUNTER — Encounter: Payer: Self-pay | Admitting: Family

## 2016-08-06 VITALS — BP 115/71 | HR 61 | Temp 98.4°F | Ht 65.0 in | Wt 164.8 lb

## 2016-08-06 DIAGNOSIS — E669 Obesity, unspecified: Secondary | ICD-10-CM | POA: Insufficient documentation

## 2016-08-06 DIAGNOSIS — Z780 Asymptomatic menopausal state: Secondary | ICD-10-CM | POA: Diagnosis not present

## 2016-08-06 DIAGNOSIS — E663 Overweight: Secondary | ICD-10-CM | POA: Insufficient documentation

## 2016-08-06 DIAGNOSIS — F419 Anxiety disorder, unspecified: Secondary | ICD-10-CM | POA: Insufficient documentation

## 2016-08-06 DIAGNOSIS — E039 Hypothyroidism, unspecified: Secondary | ICD-10-CM

## 2016-08-06 DIAGNOSIS — E785 Hyperlipidemia, unspecified: Secondary | ICD-10-CM | POA: Diagnosis not present

## 2016-08-06 DIAGNOSIS — Z1159 Encounter for screening for other viral diseases: Secondary | ICD-10-CM | POA: Diagnosis not present

## 2016-08-06 DIAGNOSIS — Z114 Encounter for screening for human immunodeficiency virus [HIV]: Secondary | ICD-10-CM

## 2016-08-06 DIAGNOSIS — F17200 Nicotine dependence, unspecified, uncomplicated: Secondary | ICD-10-CM | POA: Diagnosis not present

## 2016-08-06 DIAGNOSIS — G43109 Migraine with aura, not intractable, without status migrainosus: Secondary | ICD-10-CM | POA: Diagnosis not present

## 2016-08-06 DIAGNOSIS — K5900 Constipation, unspecified: Secondary | ICD-10-CM

## 2016-08-06 DIAGNOSIS — F341 Dysthymic disorder: Secondary | ICD-10-CM | POA: Diagnosis not present

## 2016-08-06 DIAGNOSIS — E559 Vitamin D deficiency, unspecified: Secondary | ICD-10-CM | POA: Insufficient documentation

## 2016-08-06 DIAGNOSIS — G8929 Other chronic pain: Secondary | ICD-10-CM

## 2016-08-06 DIAGNOSIS — G43909 Migraine, unspecified, not intractable, without status migrainosus: Secondary | ICD-10-CM | POA: Insufficient documentation

## 2016-08-06 DIAGNOSIS — K219 Gastro-esophageal reflux disease without esophagitis: Secondary | ICD-10-CM

## 2016-08-06 DIAGNOSIS — M542 Cervicalgia: Secondary | ICD-10-CM

## 2016-08-06 MED ORDER — TOPIRAMATE 25 MG PO TABS: 25.0000 mg | ORAL_TABLET | Freq: Two times a day (BID) | ORAL | 1 refills | Status: DC

## 2016-08-06 MED ORDER — TOPIRAMATE 100 MG PO TABS: 100.0000 mg | ORAL_TABLET | Freq: Two times a day (BID) | ORAL | 2 refills | Status: DC

## 2016-08-06 MED ORDER — CLONAZEPAM 1 MG PO TABS: 1.0000 mg | ORAL_TABLET | Freq: Every day | ORAL | 2 refills | Status: DC | PRN

## 2016-08-06 MED ORDER — BENZONATATE 100 MG PO CAPS: 100.0000 mg | ORAL_CAPSULE | Freq: Three times a day (TID) | ORAL | 3 refills | Status: DC | PRN

## 2016-08-06 MED ORDER — BENZONATATE 100 MG PO CAPS
100.0000 mg | ORAL_CAPSULE | Freq: Three times a day (TID) | ORAL | 3 refills | Status: DC | PRN
Start: 2016-08-06 — End: 2016-08-06
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
## 2016-08-06 NOTE — Patient Instructions (Signed)

## 2016-08-07 LAB — LIPID PANEL
CHOLESTEROL TOTAL: 133 mg/dL (ref 100–199)
Chol/HDL Ratio: 2.7 ratio units (ref 0.0–4.4)
HDL: 50 mg/dL (ref >39)
LDL CALC: 71 mg/dL (ref 0–99)
Triglycerides: 59 mg/dL (ref 0–149)
VLDL CHOLESTEROL CAL: 12 mg/dL (ref 5–40)

## 2016-08-07 LAB — CMP14+EGFR
ALBUMIN: 4.5 g/dL (ref 3.5–5.5)
ALK PHOS: 72 IU/L (ref 39–117)
ALT: 12 IU/L (ref 0–32)
AST: 11 IU/L (ref 0–40)
Albumin/Globulin Ratio: 1.8 (ref 1.2–2.2)
BILIRUBIN TOTAL: 0.3 mg/dL (ref 0.0–1.2)
BUN / CREAT RATIO: 13 (ref 9–23)
BUN: 8 mg/dL (ref 6–24)
CHLORIDE: 108 mmol/L — AB (ref 96–106)
CO2: 22 mmol/L (ref 18–29)
Calcium: 9.3 mg/dL (ref 8.7–10.2)
Creatinine, Ser: 0.61 mg/dL (ref 0.57–1.00)
GFR calc non Af Amer: 102 mL/min/{1.73_m2} (ref >59)
GFR, EST AFRICAN AMERICAN: 118 mL/min/{1.73_m2} (ref >59)
GLOBULIN, TOTAL: 2.5 g/dL (ref 1.5–4.5)
Glucose: 82 mg/dL (ref 65–99)
Potassium: 3.8 mmol/L (ref 3.5–5.2)
SODIUM: 146 mmol/L — AB (ref 134–144)
TOTAL PROTEIN: 7 g/dL (ref 6.0–8.5)

## 2016-08-07 LAB — HIV ANTIBODY (ROUTINE TESTING W REFLEX): HIV SCREEN 4TH GENERATION: NONREACTIVE

## 2016-08-07 LAB — VITAMIN D 25 HYDROXY (VIT D DEFICIENCY, FRACTURES): VIT D 25 HYDROXY: 46.9 ng/mL (ref 30.0–100.0)

## 2016-08-07 LAB — HEPATITIS C ANTIBODY

## 2016-08-09 ENCOUNTER — Ambulatory Visit (HOSPITAL_COMMUNITY): Payer: Self-pay

## 2016-08-09 ENCOUNTER — Other Ambulatory Visit: Payer: Self-pay

## 2016-08-09 ENCOUNTER — Encounter: Payer: Self-pay | Admitting: Pharmacist

## 2016-08-09 ENCOUNTER — Ambulatory Visit (INDEPENDENT_AMBULATORY_CARE_PROVIDER_SITE_OTHER): Payer: Medicaid Other | Admitting: Pharmacist

## 2016-08-09 VITALS — Ht 65.0 in | Wt 164.0 lb

## 2016-08-09 DIAGNOSIS — M8589 Other specified disorders of bone density and structure, multiple sites: Secondary | ICD-10-CM

## 2016-08-09 DIAGNOSIS — M8580 Other specified disorders of bone density and structure, unspecified site: Secondary | ICD-10-CM | POA: Insufficient documentation
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
## 2016-08-09 NOTE — Patient Instructions (Signed)
Calcium & Vitamin D: The Facts  Why is calcium and vitamin D consumption important? Calcium: . Most Americans do not consume adequate amounts of calcium! Calcium is required for proper muscle function, nerve communication, bone support, and many other functions in the body.  . The body uses bones as a source of calcium. Bones 'remodel' themselves continuously - the body constantly breaks bone down to release calcium and rebuilds bones by replacing calcium in the bone later.  . As we get older, the rate of bone breakdown occurs faster than bone rebuilding which could lead to osteopenia, osteoporosis, and possible fractures.   Vitamin D: . People naturally make vitamin D in the body when sunlight hits the skin and triggers a process that leads to vitamin D production. This natural vitamin D production requires about 10-15 minutes of sun exposure on the hands, arms, and face at least 2-3 times per week. However, due to decreased sun exposure and the use of sunscreen, most people will need to get additional vitamin D from foods or supplements. Your doctor can measure your body's vitamin D level through a simple blood test to determine your daily vitamin D needs.  . Vitamin D is used to help the body absorb calcium, maintain bone health, help the immune system, and reduce inflammation. It also plays a role in muscle performance, balance and risk of falling.  . Vitamin D deficiency can lead to osteomalacia or softening of the bones, bone pain, and muscle weakness.   The recommended daily allowance of Calcium and Vitamin D varies for different age groups. Age group Calcium (mg) Vitamin D (IU)  Females and Males: Age 55-50 1000 mg 600 IU  Females: Age 28- 86 1200 mg 600 IU  Males: Age 30-70 1000 mg 600 IU  Females and Males: Age 54+ 1200 mg 800 IU  Pregnant/lactating Females age 82-50 1000 mg 600 IU   How much Calcium do you get in your diet? Calcium Intake # of servings per day  Total calcium (mg)   Skim milk, 2% milk (1 cup) _________ x 300 mg   Yogurt (1 small container) _________ x 200 mg   Cheese (1oz) _________ x 200 mg   Cottage Cheese (1 cup)             ________ x 150 mg   Almond milk (1 cup) _________ x 450 mg   Fortified Orange Juice (1 cup) _________ x 300 mg   Broccoli or spinach ( 1 cup) _________ x 100 mg   Salmon (3 oz) _________ x 150 mg    Almonds (1/4 cup) _______ x 90 mg      How do we get Calcium and Vitamin D in our diet? Calcium: . Obtaining calcium from the diet is the most preferred way to reach the recommended daily goal. If this goal is not reached through diet, calcium supplements are available.  . Calcium is found in many foods including: dairy products, dark leafy vegetables (like broccoli, kale, and spinach), fish, and fortified products like juices and cereals.  . The food label will have a %DV (percent daily value) listed showing the amount of calcium per serving. To determine the total mg per serving, simply replace the % with zero (0).  For example, Almond Breeze almond milk contains 45% DV of calcium or 4107m per 1 cup.  . You can increase the amount of calcium in your diet by using more calcium products in your daily meals. Use yogurt and fruit to  make smoothies or use yogurt to top baked potatoes or make whipped potatoes. Sprinkle low fat cheese onto salads or into egg white omelets. You can even add non-fat dry milk powder (300mg calcium per 1/3 cup) to hot cereals, meat loaf, soups, or potatoes.  . Calcium supplements come in many forms including tablets, chewables, and gummies. Be sure to read the label to determine the correct number of tablets per serving and whether or not to take the supplement with food.  . Calcium carbonate products (Oscal, Caltrate, and Viactiv) are generally better absorbed when taken with food while calcium citrate products like Citracal can be taken with or without food.  . The body can only absorb about 600 mg of calcium  at one time. It is recommended to take calcium supplements in small amounts several times per day.  However, taking it all at once is better than not taking it at all. . Increasing your intake of calcium is essential for bone health, but may also lead to some side effects like constipation, increased gas, bloating or abdominal cramping. To help reduce these side effects, start with 1 tablet per day and slowly increase your intake of the supplement to the recommended doses. It is also recommended that you drink plenty of water each day. Vitamin D: . Very few foods naturally contain vitamin D. However, it is found in saltwater fish (like tuna, salmon and mackerel), beef liver, egg yolks, cheese and vitamin D fortified foods (like yogurt, cereals, orange juice and milk) . The amount of vitamin D in each food or product is listed as %DV on the product label. To determine the total amount of vitamin D per serving, drop the % sign and multiply the number by 4. For example, 1 cup of Almond Breeze almond milk contains 25% DV vitamin D or 100 IU per serving (25 x 4 =100). . Vitamin D is also found in multivitamins and supplements and may be listed as ergocalciferol (vitamin D2) or cholecalciferol (vitamin D3). Each of these forms of vitamin D are equivalent and the daily recommended intake will vary based on your age and the vitamin D levels in your body. Follow your doctor's recommendation for vitamin D intake.       Fall Prevention in the Home Falls can cause injuries and can affect people from all age groups. There are many simple things that you can do to make your home safe and to help prevent falls. What can I do on the outside of my home? Regularly repair the edges of walkways and driveways and fix any cracks. Remove high doorway thresholds. Trim any shrubbery on the main path into your home. Use bright outdoor lighting. Clear walkways of debris and clutter, including tools and rocks. Regularly check  that handrails are securely fastened and in good repair. Both sides of any steps should have handrails. Install guardrails along the edges of any raised decks or porches. Have leaves, snow, and ice cleared regularly. Use sand or salt on walkways during winter months. In the garage, clean up any spills right away, including grease or oil spills. What can I do in the bathroom? Use night lights. Install grab bars by the toilet and in the tub and shower. Do not use towel bars as grab bars. Use non-skid mats or decals on the floor of the tub or shower. If you need to sit down while you are in the shower, use a plastic, non-slip stool. Keep the floor dry. Immediately clean up   any water that spills on the floor. Remove soap buildup in the tub or shower on a regular basis. Attach bath mats securely with double-sided non-slip rug tape. Remove throw rugs and other tripping hazards from the floor. What can I do in the bedroom? Use night lights. Make sure that a bedside light is easy to reach. Do not use oversized bedding that drapes onto the floor. Have a firm chair that has side arms to use for getting dressed. Remove throw rugs and other tripping hazards from the floor. What can I do in the kitchen? Clean up any spills right away. Avoid walking on wet floors. Place frequently used items in easy-to-reach places. If you need to reach for something above you, use a sturdy step stool that has a grab bar. Keep electrical cables out of the way. Do not use floor polish or wax that makes floors slippery. If you have to use wax, make sure that it is non-skid floor wax. Remove throw rugs and other tripping hazards from the floor. What can I do in the stairways? Do not leave any items on the stairs. Make sure that there are handrails on both sides of the stairs. Fix handrails that are broken or loose. Make sure that handrails are as long as the stairways. Check any carpeting to make sure that it is firmly  attached to the stairs. Fix any carpet that is loose or worn. Avoid having throw rugs at the top or bottom of stairways, or secure the rugs with carpet tape to prevent them from moving. Make sure that you have a light switch at the top of the stairs and the bottom of the stairs. If you do not have them, have them installed. What are some other fall prevention tips? Wear closed-toe shoes that fit well and support your feet. Wear shoes that have rubber soles or low heels. When you use a stepladder, make sure that it is completely opened and that the sides are firmly locked. Have someone hold the ladder while you are using it. Do not climb a closed stepladder. Add color or contrast paint or tape to grab bars and handrails in your home. Place contrasting color strips on the first and last steps. Use mobility aids as needed, such as canes, walkers, scooters, and crutches. Turn on lights if it is dark. Replace any light bulbs that burn out. Set up furniture so that there are clear paths. Keep the furniture in the same spot. Fix any uneven floor surfaces. Choose a carpet design that does not hide the edge of steps of a stairway. Be aware of any and all pets. Review your medicines with your healthcare provider. Some medicines can cause dizziness or changes in blood pressure, which increase your risk of falling. Talk with your health care provider about other ways that you can decrease your risk of falls. This may include working with a physical therapist or trainer to improve your strength, balance, and endurance. This information is not intended to replace advice given to you by your health care provider. Make sure you discuss any questions you have with your health care provider. Document Released: 08/10/2002 Document Revised: 01/17/2016 Document Reviewed: 09/24/2014 Elsevier Interactive Patient Education  2017 [employer].               Exercise for Strong Bones  Exercise is important to build and  maintain strong bones / bone density.  There are 2 types of exercises that are important to building and maintaining   strong bones:  Weight- bearing and muscle-stregthening.  Weight-bearing Exercises  These exercises include activities that make you move against gravity while staying upright. Weight-bearing exercises can be high-impact or low-impact.  High-impact weight-bearing exercises help build bones and keep them strong. If you have broken a bone due to osteoporosis or are at risk of breaking a bone, you may need to avoid high-impact exercises. If you're not sure, you should check with your healthcare provider.  Examples of high-impact weight-bearing exercises are: Dancing  Doing high-impact aerobics  Hiking  Jogging/running  Jumping Rope  Stair climbing  Tennis  Low-impact weight-bearing exercises can also help keep bones strong and are a safe alternative if you cannot do high-impact exercises.   Examples of low-impact weight-bearing exercises are: Using elliptical training machines  Doing low-impact aerobics  Using stair-step machines  Fast walking on a treadmill or outside   Muscle-Strengthening Exercises These exercises include activities where you move your body, a weight or some other resistance against gravity. They are also known as resistance exercises and include: Lifting weights  Using elastic exercise bands  Using weight machines  Lifting your own body weight  Functional movements, such as standing and rising up on your toes  Yoga and Pilates can also improve strength, balance and flexibility. However, certain positions may not be safe for people with osteoporosis or those at increased risk of broken bones. For example, exercises that have you bend forward may increase the chance of breaking a bone in the spine.   Non-Impact Exercises There are other types of exercises that can help prevent falls.  Non-impact exercises can help you to improve balance, posture and  how well you move in everyday activities. Some of these exercises include: Balance exercises that strengthen your legs and test your balance, such as Tai Chi, can decrease your risk of falls.  Posture exercises that improve your posture and reduce rounded or "sloping" shoulders can help you decrease the chance of breaking a bone, especially in the spine.  Functional exercises that improve how well you move can help you with everyday activities and decrease your chance of falling and breaking a bone. For example, if you have trouble getting up from a chair or climbing stairs, you should do these activities as exercises.   **A physical therapist can teach you balance, posture and functional exercises. He/she can also help you learn which exercises are safe and appropriate for you.  [facility] has a physical therapy office in [place] in front of our office and referrals can be made for assessments and treatment as needed and strength and balance training.  If you would like to have an assessment with Chad and our physical therapy team please let a nurse or provider know.     

## 2016-08-20 ENCOUNTER — Other Ambulatory Visit: Payer: Self-pay | Admitting: Family

## 2016-08-22 ENCOUNTER — Other Ambulatory Visit: Payer: Self-pay | Admitting: Family

## 2016-08-31 ENCOUNTER — Other Ambulatory Visit: Payer: Self-pay | Admitting: Vascular Surgery

## 2016-08-31 DIAGNOSIS — M79604 Pain in right leg: Secondary | ICD-10-CM

## 2016-08-31 DIAGNOSIS — M79605 Pain in left leg: Principal | ICD-10-CM

## 2016-10-01 ENCOUNTER — Encounter: Payer: Self-pay | Admitting: Vascular Surgery

## 2016-10-03 ENCOUNTER — Encounter: Payer: Self-pay | Admitting: Vascular Surgery

## 2016-10-03 ENCOUNTER — Ambulatory Visit (HOSPITAL_COMMUNITY): Payer: Medicaid Other

## 2016-11-01 ENCOUNTER — Ambulatory Visit (INDEPENDENT_AMBULATORY_CARE_PROVIDER_SITE_OTHER): Payer: Medicaid Other

## 2016-11-01 ENCOUNTER — Ambulatory Visit (INDEPENDENT_AMBULATORY_CARE_PROVIDER_SITE_OTHER): Payer: Medicaid Other | Admitting: Family

## 2016-11-01 ENCOUNTER — Encounter: Payer: Self-pay | Admitting: Family

## 2016-11-01 VITALS — BP 131/71 | HR 56 | Temp 97.5°F | Ht 65.0 in | Wt 171.0 lb

## 2016-11-01 DIAGNOSIS — E039 Hypothyroidism, unspecified: Secondary | ICD-10-CM

## 2016-11-01 DIAGNOSIS — F17200 Nicotine dependence, unspecified, uncomplicated: Secondary | ICD-10-CM | POA: Diagnosis not present

## 2016-11-01 DIAGNOSIS — J42 Unspecified chronic bronchitis: Secondary | ICD-10-CM | POA: Insufficient documentation

## 2016-11-01 DIAGNOSIS — M25561 Pain in right knee: Secondary | ICD-10-CM

## 2016-11-01 DIAGNOSIS — Z1239 Encounter for other screening for malignant neoplasm of breast: Secondary | ICD-10-CM

## 2016-11-01 DIAGNOSIS — Z1231 Encounter for screening mammogram for malignant neoplasm of breast: Secondary | ICD-10-CM | POA: Diagnosis not present

## 2016-11-01 DIAGNOSIS — R928 Other abnormal and inconclusive findings on diagnostic imaging of breast: Secondary | ICD-10-CM

## 2016-11-01 MED ORDER — BENZONATATE 100 MG PO CAPS
100.0000 mg | ORAL_CAPSULE | Freq: Three times a day (TID) | ORAL | 3 refills | Status: DC | PRN
Start: 2016-11-01 — End: 2017-01-03

## 2016-11-01 MED ORDER — ROSUVASTATIN CALCIUM 20 MG PO TABS: 20.0000 mg | ORAL_TABLET | Freq: Every evening | ORAL | 1 refills | Status: DC

## 2016-11-01 MED ORDER — CITALOPRAM HYDROBROMIDE 10 MG PO TABS: 10.0000 mg | ORAL_TABLET | Freq: Every day | ORAL | 1 refills | Status: DC

## 2016-11-01 MED ORDER — FLUTICASONE FUROATE-VILANTEROL 100-25 MCG/INH IN AEPB: 1.0000 | INHALATION_SPRAY | Freq: Every day | RESPIRATORY_TRACT | 1 refills | Status: DC

## 2016-11-01 MED ORDER — NAPROXEN 500 MG PO TABS: 500.0000 mg | ORAL_TABLET | Freq: Two times a day (BID) | ORAL | 1 refills | Status: DC

## 2016-11-01 NOTE — Progress Notes (Signed)
   Subjective:    Patient ID: Elizabeth Brewer, female    DOB: 1961/02/17, 56 y.o.   MRN: [id number]  Pt presents to the office today with right knee pain. PT currently smoking a pack a day. Reports SOB on exertion. PT requesting her thyroid levels to be checked today. PT states she has been on thyroid medication in the past and currently is not taking any. She is also requesting [place] to order a diagnostic mammogram because she is due. States she had an abnormal mammogram and was told she needed a diagnostic mammogram.  tKnee Pain   The incident occurred more than 1 week ago. There was no injury mechanism. The pain is present in the right knee. The quality of the pain is described as aching. The pain is at a severity of 5/10. The pain is moderate. The pain has been intermittent since onset. Pertinent negatives include no numbness or tingling. She reports no foreign bodies present. The symptoms are aggravated by weight bearing and movement. She has tried rest and acetaminophen (oxycodone) for the symptoms. The treatment provided moderate relief.      Review of Systems  Neurological: Negative for tingling and numbness.  All other systems reviewed and are negative.      Objective:   Physical Exam  Constitutional: She is oriented to person, place, and time. She appears well-developed and well-nourished. No distress.  HENT:  Head: Normocephalic and atraumatic.  Eyes: Pupils are equal, round, and reactive to light.  Neck: Normal range of motion. Neck supple. No thyromegaly present.  Cardiovascular: Normal rate, regular rhythm, normal heart sounds and intact distal pulses.   No murmur heard. Pulmonary/Chest: Effort normal. No respiratory distress. She has wheezes.  Abdominal: Soft. Bowel sounds are normal. She exhibits no distension. There is no tenderness.  Musculoskeletal: Normal range of motion. She exhibits edema and tenderness.  Right knee pain with flexion or extension, mild swelling  present   Neurological: She is alert and oriented to person, place, and time.  Skin: Skin is warm and dry.  Psychiatric: She has a normal mood and affect. Her behavior is normal. Judgment and thought content normal.  Vitals reviewed.   BP 131/71   Pulse (!) 56   Temp 97.5 F (36.4 C) (Oral)   Ht _0  (1.651 m)   Wt 171 lb (77.6 kg)   BMI 28.46 kg/m        Assessment & Plan:  1. Chronic bronchitis, unspecified chronic bronchitis type (Carlos) Pt started on Breo today Smoking cessation discussed - fluticasone furoate-vilanterol (BREO ELLIPTA) 100-25 MCG/INH AEPB; Inhale 1 puff into the lungs daily.  Dispense: 3 each; Refill: 1  2. Right knee pain, unspecified chronicity -Pt started on naprosyn -Rest -Ice Keep elevated - naproxen (NAPROSYN) 500 MG tablet; Take 1 tablet (500 mg total) by mouth 2 (two) times daily with a meal.  Dispense: 60 tablet; Refill: 1 - DG Knee 1-2 Views Right; Future  3. TOBACCO ABUSE -Smoking cessation discussed    4. Breast cancer screening - MM Digital Diagnostic Bilat; Future - CMP14+EGFR  5. Abnormal mammogram - MM Digital Diagnostic Bilat; Future - CMP14+EGFR  6. Hypothyroidism, unspecified type - CMP14+EGFR - Thyroid Panel With TSH  [name], FNP

## 2016-11-01 NOTE — Patient Instructions (Signed)
Joint Pain Joint pain, which is also called arthralgia, can be caused by many things. Joint pain often goes away when you follow your health care provider's instructions for relieving pain at home. However, joint pain can also be caused by conditions that require further treatment. Common causes of joint pain include:  Bruising in the area of the joint.  Overuse of the joint.  Wear and tear on the joints that occur with aging (osteoarthritis).  Various other forms of arthritis.  A buildup of a crystal form of uric acid in the joint (gout).  Infections of the joint (septic arthritis) or of the bone (osteomyelitis). Your health care provider may recommend medicine to help with the pain. If your joint pain continues, additional tests may be needed to diagnose your condition. Follow these instructions at home: Watch your condition for any changes. Follow these instructions as directed to lessen the pain that you are feeling.  Take medicines only as directed by your health care provider.  Rest the affected area for as long as your health care provider says that you should. If directed to do so, raise the painful joint above the level of your heart while you are sitting or lying down.  Do not do things that cause or worsen pain.  If directed, apply ice to the painful area:  Put ice in a plastic bag.  Place a towel between your skin and the bag.  Leave the ice on for 20 minutes, 2-3 times per day.  Wear an elastic bandage, splint, or sling as directed by your health care provider. Loosen the elastic bandage or splint if your fingers or toes become numb and tingle, or if they turn cold and blue.  Begin exercising or stretching the affected area as directed by your health care provider. Ask your health care provider what types of exercise are safe for you.  Keep all follow-up visits as directed by your health care provider. This is important. Contact a health care provider if:  Your  pain increases, and medicine does not help.  Your joint pain does not improve within 3 days.  You have increased bruising or swelling.  You have a fever.  You lose 10 lb (4.5 kg) or more without trying. Get help right away if:  You are not able to move the joint.  Your fingers or toes become numb or they turn cold and blue. This information is not intended to replace advice given to you by your health care provider. Make sure you discuss any questions you have with your health care provider. Document Released: 08/20/2005 Document Revised: 01/20/2016 Document Reviewed: 06/01/2014 Elsevier Interactive Patient Education  2017 Elsevier Inc.  

## 2016-11-02 ENCOUNTER — Other Ambulatory Visit: Payer: Self-pay | Admitting: Family

## 2016-11-02 DIAGNOSIS — M1711 Unilateral primary osteoarthritis, right knee: Secondary | ICD-10-CM

## 2016-11-02 LAB — THYROID PANEL WITH TSH
FREE THYROXINE INDEX: 1.3 (ref 1.2–4.9)
T3 UPTAKE RATIO: 22 % — AB (ref 24–39)
T4 TOTAL: 6.1 ug/dL (ref 4.5–12.0)
TSH: 28.12 u[IU]/mL — ABNORMAL HIGH (ref 0.450–4.500)

## 2016-11-02 LAB — CMP14+EGFR
A/G RATIO: 2 (ref 1.2–2.2)
ALBUMIN: 4 g/dL (ref 3.5–5.5)
ALK PHOS: 75 IU/L (ref 39–117)
ALT: 8 IU/L (ref 0–32)
AST: 13 IU/L (ref 0–40)
BILIRUBIN TOTAL: 0.2 mg/dL (ref 0.0–1.2)
BUN / CREAT RATIO: 8 — AB (ref 9–23)
BUN: 5 mg/dL — ABNORMAL LOW (ref 6–24)
CHLORIDE: 104 mmol/L (ref 96–106)
CO2: 25 mmol/L (ref 18–29)
CREATININE: 0.64 mg/dL (ref 0.57–1.00)
Calcium: 8.5 mg/dL — ABNORMAL LOW (ref 8.7–10.2)
GFR calc Af Amer: 116 mL/min/{1.73_m2} (ref >59)
GFR calc non Af Amer: 101 mL/min/{1.73_m2} (ref >59)
GLOBULIN, TOTAL: 2 g/dL (ref 1.5–4.5)
Glucose: 96 mg/dL (ref 65–99)
POTASSIUM: 3.2 mmol/L — AB (ref 3.5–5.2)
Sodium: 143 mmol/L (ref 134–144)
Total Protein: 6 g/dL (ref 6.0–8.5)

## 2016-11-02 MED ORDER — LEVOTHYROXINE SODIUM 88 MCG PO TABS: 88.0000 ug | ORAL_TABLET | Freq: Every day | ORAL | 1 refills | Status: DC

## 2016-11-02 MED ORDER — POTASSIUM CHLORIDE ER 10 MEQ PO TBCR: 10.0000 meq | EXTENDED_RELEASE_TABLET | Freq: Every day | ORAL | 3 refills | Status: DC

## 2016-11-06 ENCOUNTER — Other Ambulatory Visit: Payer: Self-pay | Admitting: Family

## 2016-11-06 ENCOUNTER — Telehealth: Payer: Self-pay

## 2016-11-06 DIAGNOSIS — Z87898 Personal history of other specified conditions: Secondary | ICD-10-CM

## 2016-11-06 MED ORDER — BUDESONIDE-FORMOTEROL FUMARATE 80-4.5 MCG/ACT IN AERO
2.0000 | INHALATION_SPRAY | Freq: Two times a day (BID) | RESPIRATORY_TRACT | 3 refills | Status: DC
Start: 2016-11-06 — End: 2017-02-28

## 2016-11-06 NOTE — Telephone Encounter (Signed)
Breo changed to Symbicort per insurance  

## 2016-11-06 NOTE — Telephone Encounter (Signed)
Left detailed message for pt 

## 2016-11-07 ENCOUNTER — Telehealth: Payer: Self-pay | Admitting: Family

## 2016-11-07 NOTE — Telephone Encounter (Signed)
Pharmacy states that patient needs PA for her Symbicort. Have you seen anything?  Please advise

## 2016-11-09 ENCOUNTER — Encounter: Payer: Self-pay | Admitting: Vascular Surgery

## 2016-11-20 ENCOUNTER — Ambulatory Visit (HOSPITAL_COMMUNITY)
Admission: RE | Admit: 2016-11-20 | Discharge: 2016-11-20 | Disposition: A | Discharge status: Home / Self Care | Payer: Medicaid Other | Source: Ambulatory Visit | Attending: Family | Admitting: Family

## 2016-11-20 DIAGNOSIS — R938 Abnormal findings on diagnostic imaging of other specified body structures: Secondary | ICD-10-CM | POA: Diagnosis present

## 2016-11-20 DIAGNOSIS — R928 Other abnormal and inconclusive findings on diagnostic imaging of breast: Secondary | ICD-10-CM

## 2016-11-20 DIAGNOSIS — N6311 Unspecified lump in the right breast, upper outer quadrant: Secondary | ICD-10-CM | POA: Insufficient documentation

## 2016-11-20 DIAGNOSIS — Z1239 Encounter for other screening for malignant neoplasm of breast: Secondary | ICD-10-CM

## 2016-11-20 DIAGNOSIS — S2001XS Contusion of right breast, sequela: Secondary | ICD-10-CM | POA: Diagnosis not present

## 2016-11-22 ENCOUNTER — Ambulatory Visit (HOSPITAL_COMMUNITY)
Admission: RE | Admit: 2016-11-22 | Discharge: 2016-11-22 | Disposition: A | Discharge status: Home / Self Care | Payer: Medicaid Other | Source: Ambulatory Visit | Attending: Vascular Surgery | Admitting: Vascular Surgery

## 2016-11-22 ENCOUNTER — Ambulatory Visit (INDEPENDENT_AMBULATORY_CARE_PROVIDER_SITE_OTHER): Payer: Medicaid Other | Admitting: Vascular Surgery

## 2016-11-22 ENCOUNTER — Encounter: Payer: Self-pay | Admitting: Vascular Surgery

## 2016-11-22 VITALS — BP 120/69 | HR 60 | Temp 97.2°F | Resp 14 | Ht 64.0 in | Wt 167.0 lb

## 2016-11-22 DIAGNOSIS — M79605 Pain in left leg: Secondary | ICD-10-CM | POA: Insufficient documentation

## 2016-11-22 DIAGNOSIS — M79604 Pain in right leg: Secondary | ICD-10-CM | POA: Insufficient documentation
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
## 2016-12-03 ENCOUNTER — Other Ambulatory Visit: Payer: Self-pay | Admitting: Family

## 2016-12-03 DIAGNOSIS — M25561 Pain in right knee: Secondary | ICD-10-CM

## 2017-01-01 ENCOUNTER — Other Ambulatory Visit: Payer: Self-pay | Admitting: Family

## 2017-01-01 DIAGNOSIS — F341 Dysthymic disorder: Secondary | ICD-10-CM

## 2017-01-01 NOTE — Telephone Encounter (Signed)
Last seen Elizabeth Brewer 11/01/16, please address refill

## 2017-01-03 ENCOUNTER — Encounter: Payer: Self-pay | Admitting: Family

## 2017-01-03 ENCOUNTER — Ambulatory Visit (INDEPENDENT_AMBULATORY_CARE_PROVIDER_SITE_OTHER): Payer: Medicaid Other | Admitting: Family

## 2017-01-03 VITALS — BP 139/70 | HR 59 | Temp 98.9°F | Ht 65.0 in | Wt 162.6 lb

## 2017-01-03 DIAGNOSIS — E039 Hypothyroidism, unspecified: Secondary | ICD-10-CM | POA: Diagnosis not present

## 2017-01-03 NOTE — Patient Instructions (Signed)
Hypothyroidism Hypothyroidism is a disorder of the thyroid. The thyroid is a large gland that is located in the lower front of the neck. The thyroid releases hormones that control how the body works. With hypothyroidism, the thyroid does not make enough of these hormones. What are the causes? Causes of hypothyroidism may include:  Viral infections.  Pregnancy.  Your own defense system (immune system) attacking your thyroid.  Certain medicines.  Birth defects.  Past radiation treatments to your head or neck.  Past treatment with radioactive iodine.  Past surgical removal of part or all of your thyroid.  Problems with the gland that is located in the center of your brain (pituitary).  What are the signs or symptoms? Signs and symptoms of hypothyroidism may include:  Feeling as though you have no energy (lethargy).  Inability to tolerate cold.  Weight gain that is not explained by a change in diet or exercise habits.  Dry skin.  Coarse hair.  Menstrual irregularity.  Slowing of thought processes.  Constipation.  Sadness or depression.  How is this diagnosed? Your health care provider may diagnose hypothyroidism with blood tests and ultrasound tests. How is this treated? Hypothyroidism is treated with medicine that replaces the hormones that your body does not make. After you begin treatment, it may take several weeks for symptoms to go away. Follow these instructions at home:  Take medicines only as directed by your health care provider.  If you start taking any new medicines, tell your health care provider.  Keep all follow-up visits as directed by your health care provider. This is important. As your condition improves, your dosage needs may change. You will need to have blood tests regularly so that your health care provider can watch your condition. Contact a health care provider if:  Your symptoms do not get better with treatment.  You are taking thyroid  replacement medicine and: ? You sweat excessively. ? You have tremors. ? You feel anxious. ? You lose weight rapidly. ? You cannot tolerate heat. ? You have emotional swings. ? You have diarrhea. ? You feel weak. Get help right away if:  You develop chest pain.  You develop an irregular heartbeat.  You develop a rapid heartbeat. This information is not intended to replace advice given to you by your health care provider. Make sure you discuss any questions you have with your health care provider. Document Released: 08/20/2005 Document Revised: 01/26/2016 Document Reviewed: 01/05/2014 Elsevier Interactive Patient Education  2017 Elsevier Inc.  

## 2017-01-03 NOTE — Progress Notes (Signed)
   Subjective:    Patient ID: Elizabeth Brewer, female    DOB: 14-Apr-1961, 56 y.o.   MRN: [id number]  PT presents to the office today to recheck thyroid labs today. PT had an abnormal Thyroid panel on 11/01/16 and her levothyroxine was increased to 88 mcg.  Thyroid Problem  Presents for follow-up visit. Symptoms include anxiety, constipation, depressed mood, fatigue and hoarse voice. Patient reports no cold intolerance. The symptoms have been worsening.      Review of Systems  Constitutional: Positive for fatigue.  HENT: Positive for hoarse voice.   Gastrointestinal: Positive for constipation.  Endocrine: Negative for cold intolerance.  Psychiatric/Behavioral: The patient is nervous/anxious.   All other systems reviewed and are negative.      Objective:   Physical Exam  Constitutional: She is oriented to person, place, and time. She appears well-developed and well-nourished. No distress.  HENT:  Head: Normocephalic.  Eyes: Pupils are equal, round, and reactive to light.  Neck: Normal range of motion. Neck supple. No thyromegaly present.  Cardiovascular: Normal rate, regular rhythm, normal heart sounds and intact distal pulses.   No murmur heard. Pulmonary/Chest: Effort normal. No respiratory distress. She has decreased breath sounds. She has no wheezes.  Abdominal: Soft. Bowel sounds are normal. She exhibits no distension. There is no tenderness.  Musculoskeletal: Normal range of motion. She exhibits no edema or tenderness.  Neurological: She is alert and oriented to person, place, and time.  Skin: Skin is warm and dry.  Psychiatric: She has a normal mood and affect. Her behavior is normal. Judgment and thought content normal.  Vitals reviewed.   BP 139/70   Pulse (!) 59   Temp 98.9 F (37.2 C) (Oral)   Ht '5\' 5"'$  (1.651 m)   Wt 162 lb 9.6 oz (73.8 kg)   BMI 27.06 kg/m        Assessment & Plan:  1. Hypothyroidism, unspecified type - CMP14+EGFR - Thyroid Panel With  TSH   Continue all meds Labs pending Health Maintenance reviewed Diet and exercise encouraged RTO 3 months   [name], FNP

## 2017-01-04 LAB — THYROID PANEL WITH TSH
FREE THYROXINE INDEX: 2.9 (ref 1.2–4.9)
T3 UPTAKE RATIO: 29 % (ref 24–39)
T4 TOTAL: 10.1 ug/dL (ref 4.5–12.0)
TSH: 1.14 u[IU]/mL (ref 0.450–4.500)

## 2017-01-04 LAB — CMP14+EGFR
A/G RATIO: 1.8 (ref 1.2–2.2)
ALT: 10 IU/L (ref 0–32)
AST: 17 IU/L (ref 0–40)
Albumin: 4.1 g/dL (ref 3.5–5.5)
Alkaline Phosphatase: 68 IU/L (ref 39–117)
BUN/Creatinine Ratio: 17 (ref 9–23)
BUN: 10 mg/dL (ref 6–24)
Bilirubin Total: 0.3 mg/dL (ref 0.0–1.2)
CALCIUM: 9.2 mg/dL (ref 8.7–10.2)
CO2: 23 mmol/L (ref 18–29)
Chloride: 107 mmol/L — ABNORMAL HIGH (ref 96–106)
Creatinine, Ser: 0.58 mg/dL (ref 0.57–1.00)
GFR calc Af Amer: 120 mL/min/{1.73_m2} (ref >59)
GFR calc non Af Amer: 104 mL/min/{1.73_m2} (ref >59)
GLOBULIN, TOTAL: 2.3 g/dL (ref 1.5–4.5)
Glucose: 101 mg/dL — ABNORMAL HIGH (ref 65–99)
Potassium: 3.8 mmol/L (ref 3.5–5.2)
SODIUM: 143 mmol/L (ref 134–144)
Total Protein: 6.4 g/dL (ref 6.0–8.5)

## 2017-02-01 ENCOUNTER — Other Ambulatory Visit: Payer: Self-pay | Admitting: Family

## 2017-02-28 ENCOUNTER — Other Ambulatory Visit: Payer: Self-pay | Admitting: Family

## 2017-02-28 DIAGNOSIS — F341 Dysthymic disorder: Secondary | ICD-10-CM

## 2017-03-01 NOTE — Telephone Encounter (Signed)
Last seen 5.3.18  Elizabeth Brewer   If approved route to nurse to call into [facility]

## 2017-03-01 NOTE — Telephone Encounter (Signed)
Refill called to [facility] 

## 2017-04-09 ENCOUNTER — Telehealth: Payer: Self-pay | Admitting: Family

## 2017-04-09 ENCOUNTER — Ambulatory Visit (INDEPENDENT_AMBULATORY_CARE_PROVIDER_SITE_OTHER): Payer: Medicaid Other | Admitting: Family

## 2017-04-09 ENCOUNTER — Encounter: Payer: Self-pay | Admitting: Family

## 2017-04-09 VITALS — BP 129/77 | HR 68 | Temp 98.2°F | Ht 65.0 in | Wt 162.4 lb

## 2017-04-09 DIAGNOSIS — J42 Unspecified chronic bronchitis: Secondary | ICD-10-CM | POA: Diagnosis not present

## 2017-04-09 DIAGNOSIS — F17200 Nicotine dependence, unspecified, uncomplicated: Secondary | ICD-10-CM

## 2017-04-09 DIAGNOSIS — E538 Deficiency of other specified B group vitamins: Secondary | ICD-10-CM

## 2017-04-09 DIAGNOSIS — F341 Dysthymic disorder: Secondary | ICD-10-CM

## 2017-04-09 DIAGNOSIS — K5900 Constipation, unspecified: Secondary | ICD-10-CM

## 2017-04-09 DIAGNOSIS — M542 Cervicalgia: Secondary | ICD-10-CM | POA: Diagnosis not present

## 2017-04-09 DIAGNOSIS — K219 Gastro-esophageal reflux disease without esophagitis: Secondary | ICD-10-CM | POA: Diagnosis not present

## 2017-04-09 DIAGNOSIS — M129 Arthropathy, unspecified: Secondary | ICD-10-CM | POA: Diagnosis not present

## 2017-04-09 DIAGNOSIS — E663 Overweight: Secondary | ICD-10-CM

## 2017-04-09 DIAGNOSIS — G8929 Other chronic pain: Secondary | ICD-10-CM

## 2017-04-09 DIAGNOSIS — E559 Vitamin D deficiency, unspecified: Secondary | ICD-10-CM | POA: Diagnosis not present

## 2017-04-09 DIAGNOSIS — E785 Hyperlipidemia, unspecified: Secondary | ICD-10-CM | POA: Diagnosis not present

## 2017-04-09 DIAGNOSIS — E039 Hypothyroidism, unspecified: Secondary | ICD-10-CM | POA: Diagnosis not present

## 2017-04-09 MED ORDER — LINACLOTIDE 72 MCG PO CAPS: 72.0000 ug | ORAL_CAPSULE | Freq: Every day | ORAL | 1 refills | Status: DC

## 2017-04-09 NOTE — Progress Notes (Signed)
Subjective:    Patient ID: Elizabeth Brewer, female    DOB: 12-Jul-1961, 56 y.o.   MRN: [id number]  PT presents to the office today for chronic follow up. PT is followed by a neurologists every 2 months for chronic neck pain,seizure, and migraines. PT presents to the office today to establish care. PT is followed by a neurologists every 3 months for chronic neck pain,seizure, and migraines.  Anxiety  Presents for follow-up visit. Symptoms include depressed mood, excessive worry, irritability, nervous/anxious behavior and restlessness. Symptoms occur most days. The quality of sleep is good.    Constipation  This is a chronic problem. The current episode started more than 1 year ago. The problem is unchanged. Her stool frequency is 1 time per week or less. Pertinent negatives include no diarrhea. Risk factors include obesity. She has tried laxatives and diet changes for the symptoms. The treatment provided no relief.  Gastroesophageal Reflux  She reports no belching, no coughing or no heartburn. This is a chronic problem. The current episode started more than 1 year ago. The problem occurs occasionally. The problem has been unchanged. The symptoms are aggravated by certain foods. She has tried an antacid for the symptoms. The treatment provided moderate relief.  Thyroid Problem  Presents for follow-up visit. Symptoms include anxiety, constipation and depressed mood. Patient reports no diarrhea, dry skin or weight gain. The symptoms have been stable. Her past medical history is significant for hyperlipidemia.  Arthritis  Presents for follow-up visit. She complains of pain and stiffness. Affected locations include the neck, right knee and left knee (back). Her pain is at a severity of 10/10. Pertinent negatives include no diarrhea.  Neck Pain   This is a chronic problem. The current episode started more than 1 year ago. The problem occurs constantly. The problem has been waxing and waning. The pain is  at a severity of 10/10. The pain is moderate.  Hyperlipidemia  This is a chronic problem. The current episode started more than 1 year ago. The problem is controlled. Recent lipid tests were reviewed and are normal. Exacerbating diseases include obesity. Factors aggravating her hyperlipidemia include smoking. Current antihyperlipidemic treatment includes statins. The current treatment provides moderate improvement of lipids.  COPD Pt taking Symbicort BID. Pt states she smokes 15 cigarettes a day and has started wearing the "patch" and is "cutting back".     Review of Systems  Constitutional: Positive for irritability. Negative for weight gain.  Respiratory: Negative for cough.   Gastrointestinal: Positive for constipation. Negative for diarrhea and heartburn.  Musculoskeletal: Positive for arthritis, neck pain and stiffness.  Psychiatric/Behavioral: The patient is nervous/anxious.   All other systems reviewed and are negative.      Objective:   Physical Exam  Constitutional: She is oriented to person, place, and time. She appears well-developed and well-nourished. No distress.  HENT:  Head: Normocephalic and atraumatic.  Right Ear: External ear normal.  Left Ear: External ear normal.  Nose: Nose normal.  Mouth/Throat: Oropharynx is clear and moist.  Eyes: Pupils are equal, round, and reactive to light.  Neck: Normal range of motion. Neck supple. No thyromegaly present.  Cardiovascular: Normal rate, regular rhythm, normal heart sounds and intact distal pulses.   No murmur heard. Pulmonary/Chest: Effort normal. No respiratory distress. She has no wheezes. She has rhonchi.  Coarse productive cough   Abdominal: Soft. Bowel sounds are normal. She exhibits no distension. There is no tenderness.  Musculoskeletal: Normal range of motion. She exhibits no  edema or tenderness.  Neurological: She is alert and oriented to person, place, and time. She has normal reflexes. No cranial nerve  deficit.  Skin: Skin is warm and dry.  Psychiatric: She has a normal mood and affect. Her behavior is normal. Judgment and thought content normal.  Vitals reviewed.     BP 129/77   Pulse 68   Temp 98.2 F (36.8 C) (Oral)   Ht _0  (1.651 m)   Wt 162 lb 6.4 oz (73.7 kg)   BMI 27.02 kg/m      Assessment & Plan:  1. Chronic bronchitis, unspecified chronic bronchitis type ([place]) - CMP14+EGFR  2. Gastroesophageal reflux disease, esophagitis presence not specified - CMP14+EGFR  3. Constipation, unspecified constipation type PT started on  Linzess today Force fluids - linaclotide (LINZESS) 72 MCG capsule; Take 1 capsule (72 mcg total) by mouth daily before breakfast.  Dispense: 90 capsule; Refill: 1 - CMP14+EGFR  4. Hypothyroidism, unspecified type - CMP14+EGFR - Thyroid Panel With TSH  5. Arthropathy - CMP14+EGFR  6. TOBACCO ABUSE - CMP14+EGFR  7. Overweight (BMI 25.0-29.9)  - CMP14+EGFR  8. Hyperlipidemia, unspecified hyperlipidemia type - CMP14+EGFR - Lipid panel  9. Vitamin D deficiency  - CMP14+EGFR - VITAMIN D 25 Hydroxy (Vit-D Deficiency, Fractures)  10. Chronic neck pain - CMP14+EGFR  11. ANXIETY DEPRESSION - CMP14+EGFR   Continue all meds Labs pending Health Maintenance reviewed Diet and exercise encouraged RTO 6 months   [name], FNP

## 2017-04-09 NOTE — Telephone Encounter (Signed)
Pt aware Rx sent to [facility] removed from her list

## 2017-04-09 NOTE — Addendum Note (Signed)
Addended by: [name] A on: 04/09/2017 09:23 AM   Modules accepted: Orders

## 2017-04-09 NOTE — Patient Instructions (Signed)

## 2017-04-10 LAB — THYROID PANEL WITH TSH
FREE THYROXINE INDEX: 3 (ref 1.2–4.9)
T3 UPTAKE RATIO: 29 % (ref 24–39)
T4 TOTAL: 10.3 ug/dL (ref 4.5–12.0)
TSH: 2.76 u[IU]/mL (ref 0.450–4.500)

## 2017-04-10 LAB — LIPID PANEL
CHOLESTEROL TOTAL: 110 mg/dL (ref 100–199)
Chol/HDL Ratio: 2.4 ratio (ref 0.0–4.4)
HDL: 45 mg/dL (ref >39)
LDL Calculated: 48 mg/dL (ref 0–99)
Triglycerides: 86 mg/dL (ref 0–149)
VLDL Cholesterol Cal: 17 mg/dL (ref 5–40)

## 2017-04-10 LAB — CMP14+EGFR
A/G RATIO: 2.2 (ref 1.2–2.2)
ALK PHOS: 80 IU/L (ref 39–117)
ALT: 6 IU/L (ref 0–32)
AST: 11 IU/L (ref 0–40)
Albumin: 4.2 g/dL (ref 3.5–5.5)
BUN/Creatinine Ratio: 16 (ref 9–23)
BUN: 11 mg/dL (ref 6–24)
Bilirubin Total: 0.4 mg/dL (ref 0.0–1.2)
CALCIUM: 9 mg/dL (ref 8.7–10.2)
CO2: 22 mmol/L (ref 20–29)
CREATININE: 0.7 mg/dL (ref 0.57–1.00)
Chloride: 109 mmol/L — ABNORMAL HIGH (ref 96–106)
GFR calc Af Amer: 113 mL/min/{1.73_m2} (ref >59)
GFR, EST NON AFRICAN AMERICAN: 98 mL/min/{1.73_m2} (ref >59)
GLOBULIN, TOTAL: 1.9 g/dL (ref 1.5–4.5)
Glucose: 92 mg/dL (ref 65–99)
POTASSIUM: 3.9 mmol/L (ref 3.5–5.2)
SODIUM: 145 mmol/L — AB (ref 134–144)
Total Protein: 6.1 g/dL (ref 6.0–8.5)

## 2017-04-10 LAB — VITAMIN B12: VITAMIN B 12: 1267 pg/mL — AB (ref 232–1245)

## 2017-04-10 LAB — VITAMIN D 25 HYDROXY (VIT D DEFICIENCY, FRACTURES): VIT D 25 HYDROXY: 32.6 ng/mL (ref 30.0–100.0)

## 2017-04-26 ENCOUNTER — Ambulatory Visit (INDEPENDENT_AMBULATORY_CARE_PROVIDER_SITE_OTHER): Payer: Medicaid Other | Admitting: Family

## 2017-04-26 ENCOUNTER — Encounter: Payer: Self-pay | Admitting: Family

## 2017-04-26 VITALS — BP 100/62 | HR 58 | Temp 98.7°F | Ht 65.0 in | Wt 162.4 lb

## 2017-04-26 DIAGNOSIS — K5900 Constipation, unspecified: Secondary | ICD-10-CM | POA: Diagnosis not present

## 2017-04-26 DIAGNOSIS — R58 Hemorrhage, not elsewhere classified: Secondary | ICD-10-CM | POA: Diagnosis not present

## 2017-04-26 DIAGNOSIS — K219 Gastro-esophageal reflux disease without esophagitis: Secondary | ICD-10-CM | POA: Diagnosis not present

## 2017-04-26 LAB — CBC WITH DIFFERENTIAL/PLATELET
BASOS ABS: 0 10*3/uL (ref 0.0–0.2)
Basos: 0 %
EOS (ABSOLUTE): 0.3 10*3/uL (ref 0.0–0.4)
Eos: 3 %
Hematocrit: 40.4 % (ref 34.0–46.6)
Hemoglobin: 13.6 g/dL (ref 11.1–15.9)
IMMATURE GRANS (ABS): 0 10*3/uL (ref 0.0–0.1)
Immature Granulocytes: 0 %
LYMPHS ABS: 1.9 10*3/uL (ref 0.7–3.1)
LYMPHS: 21 %
MCH: 32.1 pg (ref 26.6–33.0)
MCHC: 33.7 g/dL (ref 31.5–35.7)
MCV: 95 fL (ref 79–97)
Monocytes Absolute: 0.3 10*3/uL (ref 0.1–0.9)
Monocytes: 4 %
Neutrophils Absolute: 6.7 10*3/uL (ref 1.4–7.0)
Neutrophils: 72 %
PLATELETS: 176 10*3/uL (ref 150–379)
RBC: 4.24 x10E6/uL (ref 3.77–5.28)
RDW: 14 % (ref 12.3–15.4)
WBC: 9.3 10*3/uL (ref 3.4–10.8)

## 2017-04-26 MED ORDER — LINACLOTIDE 145 MCG PO CAPS: 145.0000 ug | ORAL_CAPSULE | Freq: Every day | ORAL | 6 refills | Status: DC

## 2017-04-26 MED ORDER — RANITIDINE HCL 150 MG PO TABS: 150.0000 mg | ORAL_TABLET | Freq: Two times a day (BID) | ORAL | 1 refills | Status: DC

## 2017-04-26 NOTE — Patient Instructions (Signed)
Contusion A contusion is a deep bruise. Contusions happen when an injury causes bleeding under the skin. Symptoms of bruising include pain, swelling, and discolored skin. The skin may turn blue, purple, or yellow. Follow these instructions at home:  Rest the injured area.  If told, put ice on the injured area. ? Put ice in a plastic bag. ? Place a towel between your skin and the bag. ? Leave the ice on for 20 minutes, 2-3 times per day.  If told, put light pressure (compression) on the injured area using an elastic bandage. Make sure the bandage is not too tight. Remove it and put it back on as told by your doctor.  If possible, raise (elevate) the injured area above the level of your heart while you are sitting or lying down.  Take over-the-counter and prescription medicines only as told by your doctor. Contact a doctor if:  Your symptoms do not get better after several days of treatment.  Your symptoms get worse.  You have trouble moving the injured area. Get help right away if:  You have very bad pain.  You have a loss of feeling (numbness) in a hand or foot.  Your hand or foot turns pale or cold. This information is not intended to replace advice given to you by your health care provider. Make sure you discuss any questions you have with your health care provider. Document Released: 02/06/2008 Document Revised: 01/26/2016 Document Reviewed: 01/05/2015 Elsevier Interactive Patient Education  2018 Elsevier Inc.  

## 2017-04-26 NOTE — Progress Notes (Signed)
   Subjective:    Patient ID: Elizabeth Brewer, female    DOB: 1961/06/19, 56 y.o.   MRN: [id number]  Pt presents to the office with bruise on her left forearm. Pt states she noticed yesterday and can not recall hitting her arm.  Denies any pain, discharge, or redness.   Constipation  This is a chronic problem. The current episode started more than 1 year ago. The problem has been waxing and waning since onset. Her stool frequency is 2 to 3 times per week. Pertinent negatives include no fecal incontinence or hematochezia. She has tried diet changes and laxatives for the symptoms. The treatment provided mild relief.  Gastroesophageal Reflux  She complains of heartburn. She reports no belching or no coughing. This is a chronic problem. The current episode started more than 1 year ago. The problem occurs frequently. The symptoms are aggravated by certain foods. She has tried a PPI for the symptoms. The treatment provided mild relief.     Review of Systems  Respiratory: Negative for cough.   Gastrointestinal: Positive for constipation and heartburn. Negative for hematochezia.  Skin: Positive for rash.  All other systems reviewed and are negative.      Objective:   Physical Exam  Constitutional: She is oriented to person, place, and time. She appears well-developed and well-nourished. No distress.  HENT:  Head: Normocephalic and atraumatic.  Right Ear: External ear normal.  Mouth/Throat: Oropharynx is clear and moist.  Eyes: Pupils are equal, round, and reactive to light.  Neck: Normal range of motion. Neck supple. No thyromegaly present.  Cardiovascular: Normal rate, regular rhythm, normal heart sounds and intact distal pulses.   No murmur heard. Pulmonary/Chest: Effort normal and breath sounds normal. No respiratory distress. She has no wheezes.  Abdominal: Soft. Bowel sounds are normal. She exhibits no distension. There is no tenderness.  Musculoskeletal: Normal range of motion. She  exhibits no edema or tenderness.  Neurological: She is alert and oriented to person, place, and time.  Skin: Skin is warm and dry. No bruising (left forearm 1.6X1.0 mm) noted.  Psychiatric: She has a normal mood and affect. Her behavior is normal. Judgment and thought content normal.  Vitals reviewed.     BP 100/62   Pulse (!) 58   Temp 98.7 F (37.1 C) (Oral)   Ht 5\' 5"  (1.651 m)   Wt 162 lb 6.4 oz (73.7 kg)   BMI 27.02 kg/m      Assessment & Plan:  1. Constipation, unspecified constipation type Linzess increased to 145 mcg from 72 mcg Force fluids - linaclotide (LINZESS) 145 MCG CAPS capsule; Take 1 capsule (145 mcg total) by mouth daily before breakfast.  Dispense: 30 capsule; Refill: 6  2. Gastroesophageal reflux disease, esophagitis presence not specified -Diet discussed- Avoid fried, spicy, citrus foods, caffeine and alcohol -Do not eat 2-3 hours before bedtime -Encouraged small frequent meals -Avoid NSAID's - ranitidine (ZANTAC) 150 MG tablet; Take 1 tablet (150 mg total) by mouth 2 (two) times daily.  Dispense: 180 tablet; Refill: 1  3. Ecchymosis - CBC with Differential/Platelet     [name], FNP

## 2017-05-03 ENCOUNTER — Other Ambulatory Visit: Payer: Self-pay | Admitting: Family

## 2017-06-14 ENCOUNTER — Other Ambulatory Visit: Payer: Self-pay | Admitting: Family

## 2017-06-14 DIAGNOSIS — F341 Dysthymic disorder: Secondary | ICD-10-CM

## 2017-06-17 NOTE — Telephone Encounter (Signed)
Phoned in.

## 2017-07-05 ENCOUNTER — Other Ambulatory Visit: Payer: Self-pay | Admitting: Family

## 2017-08-09 ENCOUNTER — Encounter: Payer: Self-pay | Admitting: Family

## 2017-08-09 ENCOUNTER — Ambulatory Visit: Payer: Medicaid Other | Admitting: Family

## 2017-08-09 VITALS — BP 120/69 | HR 67 | Temp 98.8°F | Ht 65.0 in | Wt 166.6 lb

## 2017-08-09 DIAGNOSIS — E663 Overweight: Secondary | ICD-10-CM

## 2017-08-09 DIAGNOSIS — F341 Dysthymic disorder: Secondary | ICD-10-CM

## 2017-08-09 DIAGNOSIS — J42 Unspecified chronic bronchitis: Secondary | ICD-10-CM

## 2017-08-09 DIAGNOSIS — K219 Gastro-esophageal reflux disease without esophagitis: Secondary | ICD-10-CM

## 2017-08-09 DIAGNOSIS — G8929 Other chronic pain: Secondary | ICD-10-CM

## 2017-08-09 DIAGNOSIS — E785 Hyperlipidemia, unspecified: Secondary | ICD-10-CM

## 2017-08-09 DIAGNOSIS — F419 Anxiety disorder, unspecified: Secondary | ICD-10-CM

## 2017-08-09 DIAGNOSIS — G43109 Migraine with aura, not intractable, without status migrainosus: Secondary | ICD-10-CM

## 2017-08-09 DIAGNOSIS — F17200 Nicotine dependence, unspecified, uncomplicated: Secondary | ICD-10-CM

## 2017-08-09 DIAGNOSIS — F331 Major depressive disorder, recurrent, moderate: Secondary | ICD-10-CM

## 2017-08-09 DIAGNOSIS — M542 Cervicalgia: Secondary | ICD-10-CM

## 2017-08-09 DIAGNOSIS — E039 Hypothyroidism, unspecified: Secondary | ICD-10-CM

## 2017-08-09 NOTE — Progress Notes (Signed)
Subjective:    Patient ID: Elizabeth Brewer, female    DOB: November 26, 1960, 56 y.o.   MRN: [id number]  PT presents to the office today for chronic follow up. PT is followed by a neurologists every 3 months for chronic neck pain,seizure, and migraines.  Hypertension  This is a chronic problem. The current episode started more than 1 year ago. The problem has been resolved since onset. The problem is controlled. Associated symptoms include anxiety and neck pain. Pertinent negatives include no headaches, malaise/fatigue, peripheral edema or shortness of breath. Risk factors for coronary artery disease include dyslipidemia, post-menopausal state, smoking/tobacco exposure, sedentary lifestyle and family history. The current treatment provides moderate improvement. There is no history of kidney disease, CAD/MI, CVA or heart failure. Identifiable causes of hypertension include a thyroid problem.  Hyperlipidemia  This is a chronic problem. The current episode started more than 1 year ago. The problem is controlled. Recent lipid tests were reviewed and are normal. Pertinent negatives include no shortness of breath. Current antihyperlipidemic treatment includes statins. The current treatment provides moderate improvement of lipids. Risk factors for coronary artery disease include dyslipidemia, family history, hypertension, post-menopausal and a sedentary lifestyle.  Gastroesophageal Reflux  She complains of a hoarse voice. She reports no belching, no coughing or no heartburn. This is a chronic problem. The current episode started more than 1 year ago. The problem occurs occasionally. The problem has been waxing and waning. The symptoms are aggravated by certain foods. The treatment provided significant relief.  Anxiety  Presents for follow-up visit. Symptoms include irritability, nervous/anxious behavior and restlessness. Patient reports no excessive worry, shortness of breath or suicidal ideas. Symptoms occur  occasionally. The severity of symptoms is moderate.    Depression         This is a chronic problem.  The current episode started more than 1 year ago.   The onset quality is gradual.   The problem occurs intermittently.  Associated symptoms include restlessness.  Associated symptoms include no helplessness, no hopelessness, no headaches, not sad and no suicidal ideas.     The symptoms are aggravated by family issues.  Past treatments include SSRIs - Selective serotonin reuptake inhibitors.  Past medical history includes thyroid problem and anxiety.   Neck Pain   This is a chronic problem. The current episode started more than 1 year ago. The problem occurs constantly. The problem has been waxing and waning. The pain is at a severity of 10/10. The pain is moderate. Pertinent negatives include no headaches.  Thyroid Problem  Presents for follow-up visit. Symptoms include anxiety, constipation, dry skin and hoarse voice. The symptoms have been stable. Her past medical history is significant for hyperlipidemia. There is no history of heart failure.  COPD Pt currently smoking 1/2 pack a day. Taking the Symbicort BID.     Review of Systems  Constitutional: Positive for irritability. Negative for malaise/fatigue.  HENT: Positive for hoarse voice.   Respiratory: Negative for cough and shortness of breath.   Gastrointestinal: Positive for constipation. Negative for heartburn.  Musculoskeletal: Positive for neck pain.  Neurological: Negative for headaches.  Psychiatric/Behavioral: Positive for depression. Negative for suicidal ideas. The patient is nervous/anxious.   All other systems reviewed and are negative.      Objective:   Physical Exam  Constitutional: She is oriented to person, place, and time. She appears well-developed and well-nourished. No distress.  HENT:  Head: Normocephalic and atraumatic.  Right Ear: External ear normal.  Left Ear: External  ear normal.  Nose: Nose normal.    Mouth/Throat: Oropharynx is clear and moist.  Eyes: Pupils are equal, round, and reactive to light.  Neck: Normal range of motion. Neck supple. No thyromegaly present.  Cardiovascular: Normal rate, regular rhythm, normal heart sounds and intact distal pulses.  No murmur heard. Pulmonary/Chest: Effort normal. No respiratory distress. She has no wheezes. She has rhonchi.  Intermittent coarse cough  Abdominal: Soft. Bowel sounds are normal. She exhibits no distension. There is no tenderness.  Musculoskeletal: She exhibits tenderness. She exhibits no edema.  Decrease ROM of neck with rotation related to pain  Neurological: She is alert and oriented to person, place, and time.  Skin: Skin is warm and dry.  Psychiatric: She has a normal mood and affect. Her behavior is normal. Judgment and thought content normal.  Vitals reviewed.   BP 120/69   Pulse 67   Temp 98.8 F (37.1 C) (Oral)   Ht 5\' 5"  (1.651 m)   Wt 166 lb 9.6 oz (75.6 kg)   BMI 27.72 kg/m      Assessment & Plan:  1. Migraine with aura and without status migrainosus, not intractable  2. Gastroesophageal reflux disease, esophagitis presence not specified  3. Chronic bronchitis, unspecified chronic bronchitis type ([place])  4. Hypothyroidism, unspecified type  5. Hyperlipidemia, unspecified hyperlipidemia type   6. Overweight (BMI 25.0-29.9)  7. Anxiety  8. Chronic neck pain  9. TOBACCO ABUSE Smoking cessation discussed   10. Moderate episode of recurrent major depressive disorder ([place])  11. ANXIETY DEPRESSION    Continue all meds Pt's insurance changed and wants to hold off on lab work until our next visit until she can change back to [facility]  Health Maintenance reviewed Diet and exercise encouraged RTO 4 months  [name], FNP

## 2017-08-09 NOTE — Patient Instructions (Signed)

## 2017-11-14 ENCOUNTER — Other Ambulatory Visit: Payer: Self-pay | Admitting: Family

## 2017-11-14 NOTE — Telephone Encounter (Signed)
Next Ov 12/06/17

## 2017-12-04 ENCOUNTER — Other Ambulatory Visit: Payer: Self-pay | Admitting: Family

## 2017-12-05 ENCOUNTER — Other Ambulatory Visit: Payer: Self-pay | Admitting: Family

## 2017-12-05 NOTE — Telephone Encounter (Signed)
Last seen 08/09/17  [facility]

## 2017-12-06 ENCOUNTER — Encounter: Payer: Self-pay | Admitting: Family

## 2017-12-06 ENCOUNTER — Ambulatory Visit (INDEPENDENT_AMBULATORY_CARE_PROVIDER_SITE_OTHER): Payer: Medicare Other | Admitting: Family

## 2017-12-06 VITALS — BP 148/80 | HR 67 | Temp 98.5°F | Ht 65.0 in | Wt 163.6 lb

## 2017-12-06 DIAGNOSIS — F419 Anxiety disorder, unspecified: Secondary | ICD-10-CM

## 2017-12-06 DIAGNOSIS — F17200 Nicotine dependence, unspecified, uncomplicated: Secondary | ICD-10-CM | POA: Diagnosis not present

## 2017-12-06 DIAGNOSIS — K219 Gastro-esophageal reflux disease without esophagitis: Secondary | ICD-10-CM

## 2017-12-06 DIAGNOSIS — E663 Overweight: Secondary | ICD-10-CM | POA: Diagnosis not present

## 2017-12-06 DIAGNOSIS — E039 Hypothyroidism, unspecified: Secondary | ICD-10-CM | POA: Diagnosis not present

## 2017-12-06 DIAGNOSIS — F331 Major depressive disorder, recurrent, moderate: Secondary | ICD-10-CM | POA: Diagnosis not present

## 2017-12-06 DIAGNOSIS — E785 Hyperlipidemia, unspecified: Secondary | ICD-10-CM

## 2017-12-06 DIAGNOSIS — E559 Vitamin D deficiency, unspecified: Secondary | ICD-10-CM

## 2017-12-06 DIAGNOSIS — J449 Chronic obstructive pulmonary disease, unspecified: Secondary | ICD-10-CM | POA: Diagnosis not present

## 2017-12-06 DIAGNOSIS — G43109 Migraine with aura, not intractable, without status migrainosus: Secondary | ICD-10-CM | POA: Diagnosis not present

## 2017-12-06 DIAGNOSIS — K5900 Constipation, unspecified: Secondary | ICD-10-CM

## 2017-12-06 DIAGNOSIS — F341 Dysthymic disorder: Secondary | ICD-10-CM

## 2017-12-06 MED ORDER — BUDESONIDE-FORMOTEROL FUMARATE 160-4.5 MCG/ACT IN AERO: 2.0000 | INHALATION_SPRAY | Freq: Two times a day (BID) | RESPIRATORY_TRACT | 3 refills | Status: DC

## 2017-12-06 MED ORDER — RANITIDINE HCL 150 MG PO TABS: 150.0000 mg | ORAL_TABLET | Freq: Two times a day (BID) | ORAL | 1 refills | Status: DC

## 2017-12-06 MED ORDER — CLONAZEPAM 1 MG PO TABS: ORAL_TABLET | ORAL | 3 refills | Status: DC

## 2017-12-06 NOTE — Progress Notes (Signed)
Subjective:    Patient ID: Elizabeth Brewer, female    DOB: 1961/06/06, 57 y.o.   MRN: [id number]  PT presents to the office todayfor chronic follow up. PT is followed by a neurologists every 3 months for chronic neck pain,seizure, and migraines.  Hypertension  This is a chronic problem. The current episode started more than 1 year ago. Associated symptoms include anxiety. Pertinent negatives include no peripheral edema or shortness of breath. Risk factors for coronary artery disease include obesity, smoking/tobacco exposure and family history. There is no history of CVA or heart failure. Identifiable causes of hypertension include a thyroid problem.  Migraine   This is a chronic problem. The current episode started in the past 7 days (2-3 a week). The problem occurs every few minutes. Associated symptoms include nausea, phonophobia and photophobia. Her past medical history is significant for hypertension and obesity.  Thyroid Problem  Presents for follow-up visit. Symptoms include anxiety, constipation, depressed mood, dry skin and fatigue. Her past medical history is significant for hyperlipidemia. There is no history of heart failure.  Hyperlipidemia  This is a chronic problem. The current episode started more than 1 year ago. The problem is controlled. Recent lipid tests were reviewed and are normal. Exacerbating diseases include obesity. Pertinent negatives include no shortness of breath. Current antihyperlipidemic treatment includes statins. The current treatment provides moderate improvement of lipids.  Anxiety  Presents for follow-up visit. Symptoms include depressed mood, excessive worry, irritability, nausea and nervous/anxious behavior. Patient reports no shortness of breath. Symptoms occur most days. The severity of symptoms is moderate. The quality of sleep is good.    COPD  Smoking 1/2 pack day. Taking Symbicort daily.    Review of Systems  Constitutional: Positive for fatigue  and irritability.  Eyes: Positive for photophobia.  Respiratory: Negative for shortness of breath.   Gastrointestinal: Positive for constipation and nausea.  Psychiatric/Behavioral: The patient is nervous/anxious.   All other systems reviewed and are negative.      Objective:   Physical Exam  Constitutional: She is oriented to person, place, and time. She appears well-developed and well-nourished. No distress.  HENT:  Head: Normocephalic and atraumatic.  Right Ear: External ear normal.  Left Ear: External ear normal.  Nose: Nose normal.  Mouth/Throat: Posterior oropharyngeal erythema present.  Eyes: Pupils are equal, round, and reactive to light.  Neck: Normal range of motion. Neck supple. No thyromegaly present.  Cardiovascular: Normal rate, regular rhythm, normal heart sounds and intact distal pulses.  No murmur heard. Pulmonary/Chest: Effort normal and breath sounds normal. No respiratory distress. She has no wheezes.  Abdominal: Soft. Bowel sounds are normal. She exhibits no distension. There is no tenderness.  Musculoskeletal: She exhibits tenderness. She exhibits no edema.  Pain in cervical with rotation, flexion, and extension  Neurological: She is alert and oriented to person, place, and time.  Skin: Skin is warm and dry.  Psychiatric: She has a normal mood and affect. Her behavior is normal. Judgment and thought content normal.  Vitals reviewed.     BP (!) 148/80   Pulse 67   Temp 98.5 F (36.9 C) (Oral)   Ht 5\' 5"  (1.651 m)   Wt 163 lb 9.6 oz (74.2 kg)   BMI 27.22 kg/m      Assessment & Plan:  1. Migraine with aura and without status migrainosus, not intractable  2. Hypothyroidism, unspecified type  3. Gastroesophageal reflux disease, esophagitis presence not specified - ranitidine (ZANTAC) 150 MG tablet; Take  1 tablet (150 mg total) by mouth 2 (two) times daily.  Dispense: 180 tablet; Refill: 1  4. Overweight (BMI 25.0-29.9)  5. Hyperlipidemia,  unspecified hyperlipidemia type  6. Anxiety  7. Constipation, unspecified constipation type  8. Moderate episode of recurrent major depressive disorder ([place])  9. Vitamin D deficiency  10. TOBACCO ABUSE  11. ANXIETY DEPRESSION - clonazePAM (KLONOPIN) 1 MG tablet; TAKE 1 TABLET DAILY AS NEEDED FOR ANXIETY  Dispense: 90 tablet; Refill: 3  12. Chronic obstructive pulmonary disease, unspecified COPD type (HCC - budesonide-formoterol (SYMBICORT) 160-4.5 MCG/ACT inhaler; Inhale 2 puffs into the lungs 2 (two) times daily.  Dispense: 1 Inhaler; Refill: 3   Continue all meds Will hold off labs today since patient states she lost her insurance, will do on next visit Health Maintenance reviewed Diet and exercise encouraged RTO 3 months   [name], FNP

## 2017-12-06 NOTE — Patient Instructions (Signed)
Chronic Obstructive Pulmonary Disease Exacerbation  Chronic obstructive pulmonary disease (COPD) is a common lung problem. In COPD, the flow of air from the lungs is limited. COPD exacerbations are times that breathing gets worse and you need extra treatment. Without treatment they can be life threatening. If they happen often, your lungs can become more damaged. If your COPD gets worse, your doctor may treat you with:  ? Medicines.  ? Oxygen.  ? Different ways to clear your airway, such as using a mask.    Follow these instructions at home:  ? Do not smoke.  ? Avoid tobacco smoke and other things that bother your lungs.  ? If given, take your antibiotic medicine as told. Finish the medicine even if you start to feel better.  ? Only take medicines as told by your doctor.  ? Drink enough fluids to keep your pee (urine) clear or pale yellow (unless your doctor has told you not to).  ? Use a cool mist machine (vaporizer).  ? If you use oxygen or a machine that turns liquid medicine into a mist (nebulizer), continue to use them as told.  ? Keep up with shots (vaccinations) as told by your doctor.  ? Exercise regularly.  ? Eat healthy foods.  ? Keep all doctor visits as told.  Get help right away if:  ? You are very short of breath and it gets worse.  ? You have trouble talking.  ? You have bad chest pain.  ? You have blood in your spit (sputum).  ? You have a fever.  ? You keep throwing up (vomiting).  ? You feel weak, or you pass out (faint).  ? You feel confused.  ? You keep getting worse.  This information is not intended to replace advice given to you by your health care provider. Make sure you discuss any questions you have with your health care provider.  Document Released: 08/09/2011 Document Revised: 01/26/2016 Document Reviewed: 04/24/2013  Elsevier Interactive Patient Education ? 2017 [employer]

## 2017-12-19 DIAGNOSIS — M5481 Occipital neuralgia: Secondary | ICD-10-CM | POA: Diagnosis not present

## 2017-12-19 DIAGNOSIS — M25512 Pain in left shoulder: Secondary | ICD-10-CM | POA: Diagnosis not present

## 2017-12-19 DIAGNOSIS — M542 Cervicalgia: Secondary | ICD-10-CM | POA: Diagnosis not present

## 2018-02-28 ENCOUNTER — Other Ambulatory Visit: Payer: Self-pay | Admitting: Family

## 2018-03-03 NOTE — Telephone Encounter (Signed)
OV 03/13/18

## 2018-03-13 ENCOUNTER — Encounter: Payer: Self-pay | Admitting: Family

## 2018-03-13 ENCOUNTER — Ambulatory Visit (INDEPENDENT_AMBULATORY_CARE_PROVIDER_SITE_OTHER): Payer: Medicare Other | Admitting: Family

## 2018-03-13 VITALS — BP 153/84 | HR 68 | Temp 98.5°F | Ht 65.0 in | Wt 168.8 lb

## 2018-03-13 DIAGNOSIS — K219 Gastro-esophageal reflux disease without esophagitis: Secondary | ICD-10-CM

## 2018-03-13 DIAGNOSIS — E559 Vitamin D deficiency, unspecified: Secondary | ICD-10-CM | POA: Diagnosis not present

## 2018-03-13 DIAGNOSIS — F331 Major depressive disorder, recurrent, moderate: Secondary | ICD-10-CM | POA: Diagnosis not present

## 2018-03-13 DIAGNOSIS — E663 Overweight: Secondary | ICD-10-CM | POA: Diagnosis not present

## 2018-03-13 DIAGNOSIS — J449 Chronic obstructive pulmonary disease, unspecified: Secondary | ICD-10-CM

## 2018-03-13 DIAGNOSIS — Z23 Encounter for immunization: Secondary | ICD-10-CM | POA: Diagnosis not present

## 2018-03-13 DIAGNOSIS — G43109 Migraine with aura, not intractable, without status migrainosus: Secondary | ICD-10-CM | POA: Diagnosis not present

## 2018-03-13 DIAGNOSIS — G8929 Other chronic pain: Secondary | ICD-10-CM

## 2018-03-13 DIAGNOSIS — F419 Anxiety disorder, unspecified: Secondary | ICD-10-CM

## 2018-03-13 DIAGNOSIS — E785 Hyperlipidemia, unspecified: Secondary | ICD-10-CM | POA: Diagnosis not present

## 2018-03-13 DIAGNOSIS — F17200 Nicotine dependence, unspecified, uncomplicated: Secondary | ICD-10-CM | POA: Diagnosis not present

## 2018-03-13 DIAGNOSIS — K5900 Constipation, unspecified: Secondary | ICD-10-CM

## 2018-03-13 DIAGNOSIS — E039 Hypothyroidism, unspecified: Secondary | ICD-10-CM | POA: Diagnosis not present

## 2018-03-13 DIAGNOSIS — M542 Cervicalgia: Secondary | ICD-10-CM | POA: Diagnosis not present

## 2018-03-13 NOTE — Progress Notes (Signed)
Subjective:    Patient ID: Elizabeth Brewer, female    DOB: [birth year], 57 y.o.   MRN: [id number]  Chief Complaint  Patient presents with  . Medical Management of Chronic Issues    three month recheck    PT presents to the office todayfor chronic follow up. PT is followed by a neurologists every36month for chronic neck pain,seizure, and migraines.  Hyperlipidemia  This is a chronic problem. The current episode started more than 1 year ago. The problem is controlled. Recent lipid tests were reviewed and are normal. Exacerbating diseases include obesity. Associated symptoms include shortness of breath ("some times"). Current antihyperlipidemic treatment includes statins. The current treatment provides moderate improvement of lipids. Risk factors for coronary artery disease include diabetes mellitus, dyslipidemia, hypertension and a sedentary lifestyle.  Gastroesophageal Reflux  She complains of heartburn and a hoarse voice. She reports no belching or no coughing. This is a chronic problem. The current episode started more than 1 year ago. The problem occurs occasionally. The problem has been waxing and waning. The symptoms are aggravated by certain foods. Associated symptoms include fatigue. Risk factors include obesity. She has tried a PPI for the symptoms. The treatment provided moderate relief.  Hypertension  This is a chronic problem. The current episode started more than 1 year ago. The problem has been waxing and waning since onset. The problem is uncontrolled. Associated symptoms include anxiety and shortness of breath ("some times"). Pertinent negatives include no malaise/fatigue or peripheral edema. Risk factors for coronary artery disease include dyslipidemia, diabetes mellitus and obesity. There is no history of kidney disease, CAD/MI or heart failure. Identifiable causes of hypertension include a thyroid problem.  Thyroid Problem  Presents for follow-up visit. Symptoms include  anxiety, constipation, depressed mood, fatigue and hoarse voice. Patient reports no diaphoresis or diarrhea. The symptoms have been stable. Her past medical history is significant for hyperlipidemia. There is no history of heart failure.  Anxiety  Presents for follow-up visit. Symptoms include depressed mood, excessive worry, irritability, nervous/anxious behavior, restlessness and shortness of breath ("some times"). Symptoms occur occasionally. The severity of symptoms is moderate. The quality of sleep is good.    Depression         This is a chronic problem.  The current episode started more than 1 year ago.   The onset quality is gradual.   The problem occurs intermittently.  The problem has been waxing and waning since onset.  Associated symptoms include fatigue, irritable, restlessness and sad.  Past treatments include SSRIs - Selective serotonin reuptake inhibitors.  Past medical history includes thyroid problem and anxiety.   COPD PT currently smoking 15 cigarettes a day. States she does not use Symbicort daily because of price.    Review of Systems  Constitutional: Positive for fatigue and irritability. Negative for diaphoresis and malaise/fatigue.  HENT: Positive for hoarse voice.   Respiratory: Positive for shortness of breath ("some times"). Negative for cough.   Gastrointestinal: Positive for constipation and heartburn. Negative for diarrhea.  Psychiatric/Behavioral: Positive for depression. The patient is nervous/anxious.   All other systems reviewed and are negative.      Objective:   Physical Exam  Constitutional: She is oriented to person, place, and time. She appears well-developed and well-nourished. She is irritable. No distress.  HENT:  Head: Normocephalic and atraumatic.  Right Ear: External ear normal.  Left Ear: External ear normal.  Mouth/Throat: Oropharynx is clear and moist.  Eyes: Pupils are equal, round, and reactive to light.  Neck: Normal range of motion.  Neck supple. No thyromegaly present.  Cardiovascular: Normal rate, regular rhythm, normal heart sounds and intact distal pulses.  No murmur heard. Pulmonary/Chest: Effort normal. No respiratory distress. She has no wheezes. She has rhonchi.  Abdominal: Soft. Bowel sounds are normal. She exhibits no distension. There is no tenderness.  Musculoskeletal: Normal range of motion. She exhibits tenderness (pain in posterior neck with rotation, flexion or extension). She exhibits no edema.  Neurological: She is alert and oriented to person, place, and time. She has normal reflexes. No cranial nerve deficit.  Skin: Skin is warm and dry.  Psychiatric: She has a normal mood and affect. Her behavior is normal. Judgment and thought content normal.  Vitals reviewed.     BP (!) 158/82   Pulse 68   Temp 98.5 F (36.9 C) (Oral)   Ht '5\' 5"'$  (1.651 m)   Wt 168 lb 12.8 oz (76.6 kg)   BMI 28.09 kg/m      Assessment & Plan:  Elizabeth Brewer comes in today with chief complaint of Medical Management of Chronic Issues (three month recheck)   Diagnosis and orders addressed:  1. Anxiety - CMP14+EGFR - CBC with Differential/Platelet  2. Chronic neck pain - CMP14+EGFR - CBC with Differential/Platelet  3. Constipation, unspecified constipation type - CMP14+EGFR - CBC with Differential/Platelet  4. Chronic obstructive pulmonary disease, unspecified COPD type ([place]) Smoking cessation discussed Start using Symbicort BID, no samples in our office today. Call next week to see if have received any - CMP14+EGFR - CBC with Differential/Platelet  5. Moderate episode of recurrent major depressive disorder (HCC) - CMP14+EGFR - CBC with Differential/Platelet  6. Gastroesophageal reflux disease, esophagitis presence not specified - CMP14+EGFR - CBC with Differential/Platelet  7. Hyperlipidemia, unspecified hyperlipidemia type - CMP14+EGFR - Lipid panel - CBC with Differential/Platelet  8.  Hypothyroidism, unspecified type - CMP14+EGFR - CBC with Differential/Platelet  9. Overweight (BMI 25.0-29.9) - CMP14+EGFR - CBC with Differential/Platelet  10. TOBACCO ABUSE - CMP14+EGFR - CBC with Differential/Platelet  11. Vitamin D deficiency - CMP14+EGFR - CBC with Differential/Platelet - VITAMIN D 25 Hydroxy (Vit-D Deficiency, Fractures)  12. Migraine with aura and without status migrainosus, not intractable - CMP14+EGFR - CBC with Differential/Platelet   Labs pending Health Maintenance reviewed Diet and exercise encouraged  Follow up plan: 3 months    [name], FNP

## 2018-03-13 NOTE — Patient Instructions (Signed)

## 2018-03-13 NOTE — Addendum Note (Signed)
Addended by: Shelbie Ammons on: 03/13/2018 10:47 AM   Modules accepted: Orders

## 2018-03-14 LAB — CMP14+EGFR
ALK PHOS: 65 IU/L (ref 39–117)
ALT: 8 IU/L (ref 0–32)
AST: 14 IU/L (ref 0–40)
Albumin/Globulin Ratio: 1.9 (ref 1.2–2.2)
Albumin: 4.1 g/dL (ref 3.5–5.5)
BUN/Creatinine Ratio: 12 (ref 9–23)
BUN: 7 mg/dL (ref 6–24)
Bilirubin Total: 0.3 mg/dL (ref 0.0–1.2)
CO2: 24 mmol/L (ref 20–29)
CREATININE: 0.6 mg/dL (ref 0.57–1.00)
Calcium: 9.1 mg/dL (ref 8.7–10.2)
Chloride: 107 mmol/L — ABNORMAL HIGH (ref 96–106)
GFR calc Af Amer: 118 mL/min/{1.73_m2} (ref >59)
GFR calc non Af Amer: 102 mL/min/{1.73_m2} (ref >59)
GLOBULIN, TOTAL: 2.2 g/dL (ref 1.5–4.5)
GLUCOSE: 94 mg/dL (ref 65–99)
Potassium: 3.4 mmol/L — ABNORMAL LOW (ref 3.5–5.2)
SODIUM: 145 mmol/L — AB (ref 134–144)
Total Protein: 6.3 g/dL (ref 6.0–8.5)

## 2018-03-14 LAB — CBC WITH DIFFERENTIAL/PLATELET
BASOS ABS: 0 10*3/uL (ref 0.0–0.2)
Basos: 0 %
EOS (ABSOLUTE): 0.2 10*3/uL (ref 0.0–0.4)
Eos: 3 %
HEMOGLOBIN: 14.5 g/dL (ref 11.1–15.9)
Hematocrit: 42.7 % (ref 34.0–46.6)
Immature Grans (Abs): 0 10*3/uL (ref 0.0–0.1)
Immature Granulocytes: 0 %
LYMPHS ABS: 1.3 10*3/uL (ref 0.7–3.1)
Lymphs: 18 %
MCH: 33 pg (ref 26.6–33.0)
MCHC: 34 g/dL (ref 31.5–35.7)
MCV: 97 fL (ref 79–97)
Monocytes Absolute: 0.4 10*3/uL (ref 0.1–0.9)
Monocytes: 6 %
NEUTROS ABS: 5.3 10*3/uL (ref 1.4–7.0)
Neutrophils: 73 %
PLATELETS: 195 10*3/uL (ref 150–450)
RBC: 4.39 x10E6/uL (ref 3.77–5.28)
RDW: 14.4 % (ref 12.3–15.4)
WBC: 7.3 10*3/uL (ref 3.4–10.8)

## 2018-03-14 LAB — LIPID PANEL
CHOL/HDL RATIO: 2.4 ratio (ref 0.0–4.4)
Cholesterol, Total: 135 mg/dL (ref 100–199)
HDL: 57 mg/dL (ref >39)
LDL CALC: 65 mg/dL (ref 0–99)
TRIGLYCERIDES: 66 mg/dL (ref 0–149)
VLDL Cholesterol Cal: 13 mg/dL (ref 5–40)

## 2018-03-14 LAB — VITAMIN D 25 HYDROXY (VIT D DEFICIENCY, FRACTURES): VIT D 25 HYDROXY: 26.6 ng/mL — AB (ref 30.0–100.0)

## 2018-03-18 ENCOUNTER — Other Ambulatory Visit: Payer: Self-pay | Admitting: Family

## 2018-03-27 DIAGNOSIS — M25512 Pain in left shoulder: Secondary | ICD-10-CM | POA: Diagnosis not present

## 2018-03-27 DIAGNOSIS — M5481 Occipital neuralgia: Secondary | ICD-10-CM | POA: Diagnosis not present

## 2018-03-27 DIAGNOSIS — M542 Cervicalgia: Secondary | ICD-10-CM | POA: Diagnosis not present

## 2018-04-21 ENCOUNTER — Other Ambulatory Visit: Payer: Self-pay | Admitting: Family

## 2018-05-01 ENCOUNTER — Ambulatory Visit (HOSPITAL_COMMUNITY)
Admission: RE | Admit: 2018-05-01 | Discharge: 2018-05-01 | Disposition: A | Discharge status: Home / Self Care | Payer: Medicare Other | Source: Ambulatory Visit | Attending: Family | Admitting: Family

## 2018-05-01 ENCOUNTER — Other Ambulatory Visit: Payer: Self-pay | Admitting: Family

## 2018-05-01 DIAGNOSIS — Z1231 Encounter for screening mammogram for malignant neoplasm of breast: Secondary | ICD-10-CM

## 2018-06-12 ENCOUNTER — Other Ambulatory Visit: Payer: Self-pay | Admitting: Family

## 2018-06-12 DIAGNOSIS — K219 Gastro-esophageal reflux disease without esophagitis: Secondary | ICD-10-CM

## 2018-06-16 ENCOUNTER — Encounter: Payer: Self-pay | Admitting: Family

## 2018-06-16 ENCOUNTER — Ambulatory Visit (INDEPENDENT_AMBULATORY_CARE_PROVIDER_SITE_OTHER): Payer: Medicare Other | Admitting: Family

## 2018-06-16 VITALS — BP 149/79 | HR 66 | Temp 98.1°F | Ht 65.0 in | Wt 175.2 lb

## 2018-06-16 DIAGNOSIS — F1320 Sedative, hypnotic or anxiolytic dependence, uncomplicated: Secondary | ICD-10-CM

## 2018-06-16 DIAGNOSIS — Z23 Encounter for immunization: Secondary | ICD-10-CM

## 2018-06-16 DIAGNOSIS — F341 Dysthymic disorder: Secondary | ICD-10-CM

## 2018-06-16 DIAGNOSIS — J449 Chronic obstructive pulmonary disease, unspecified: Secondary | ICD-10-CM

## 2018-06-16 DIAGNOSIS — E663 Overweight: Secondary | ICD-10-CM

## 2018-06-16 DIAGNOSIS — M542 Cervicalgia: Secondary | ICD-10-CM

## 2018-06-16 DIAGNOSIS — K219 Gastro-esophageal reflux disease without esophagitis: Secondary | ICD-10-CM

## 2018-06-16 DIAGNOSIS — E785 Hyperlipidemia, unspecified: Secondary | ICD-10-CM | POA: Diagnosis not present

## 2018-06-16 DIAGNOSIS — E039 Hypothyroidism, unspecified: Secondary | ICD-10-CM

## 2018-06-16 DIAGNOSIS — F331 Major depressive disorder, recurrent, moderate: Secondary | ICD-10-CM | POA: Diagnosis not present

## 2018-06-16 DIAGNOSIS — G43109 Migraine with aura, not intractable, without status migrainosus: Secondary | ICD-10-CM

## 2018-06-16 DIAGNOSIS — G8929 Other chronic pain: Secondary | ICD-10-CM | POA: Diagnosis not present

## 2018-06-16 DIAGNOSIS — E559 Vitamin D deficiency, unspecified: Secondary | ICD-10-CM | POA: Diagnosis not present

## 2018-06-16 DIAGNOSIS — Z79899 Other long term (current) drug therapy: Secondary | ICD-10-CM | POA: Insufficient documentation

## 2018-06-16 DIAGNOSIS — F17200 Nicotine dependence, unspecified, uncomplicated: Secondary | ICD-10-CM

## 2018-06-16 DIAGNOSIS — I10 Essential (primary) hypertension: Secondary | ICD-10-CM

## 2018-06-16 DIAGNOSIS — F419 Anxiety disorder, unspecified: Secondary | ICD-10-CM | POA: Diagnosis not present

## 2018-06-16 MED ORDER — HYDROCHLOROTHIAZIDE 12.5 MG PO TABS: 12.5000 mg | ORAL_TABLET | Freq: Every day | ORAL | 3 refills | Status: DC

## 2018-06-16 MED ORDER — BENZONATATE 200 MG PO CAPS: 200.0000 mg | ORAL_CAPSULE | Freq: Three times a day (TID) | ORAL | 2 refills | Status: DC | PRN

## 2018-06-16 MED ORDER — CLONAZEPAM 1 MG PO TABS: ORAL_TABLET | ORAL | 5 refills | Status: DC

## 2018-06-16 NOTE — Patient Instructions (Signed)

## 2018-06-16 NOTE — Progress Notes (Signed)
Subjective:    Patient ID: Elizabeth Brewer, female    DOB: 10/03/1960, 57 y.o.   MRN: [id number]  Chief Complaint  Patient presents with  . Medical Management of Chronic Issues    three month recheck    PT presents to the office todayfor chronic follow up. PT is followed by a neurologists every37month for chronic neck pain,seizure, and migraines. Thyroid Problem  Presents for follow-up visit. Symptoms include anxiety, depressed mood, fatigue and hoarse voice. Patient reports no diarrhea or dry skin. The symptoms have been stable. Her past medical history is significant for hyperlipidemia. There is no history of heart failure.  Gastroesophageal Reflux  She complains of belching, heartburn and a hoarse voice. She reports no coughing. This is a chronic problem. The current episode started more than 1 year ago. The problem occurs occasionally. The problem has been waxing and waning. Associated symptoms include fatigue. Risk factors include obesity. She has tried a histamine-2 antagonist for the symptoms. The treatment provided moderate relief.  Hyperlipidemia  This is a chronic problem. The current episode started more than 1 year ago. The problem is controlled. Recent lipid tests were reviewed and are normal. Exacerbating diseases include obesity. Associated symptoms include shortness of breath. Current antihyperlipidemic treatment includes statins. The current treatment provides moderate improvement of lipids. Risk factors for coronary artery disease include dyslipidemia, diabetes mellitus and a sedentary lifestyle.  Depression         This is a chronic problem.  The current episode started more than 1 year ago.   The onset quality is gradual.   The problem occurs intermittently.  The problem has been waxing and waning since onset.  Associated symptoms include fatigue, irritable, restlessness, decreased interest and sad.  Associated symptoms include no helplessness and no hopelessness.     The  symptoms are aggravated by family issues.  Past treatments include SSRIs - Selective serotonin reuptake inhibitors.  Past medical history includes thyroid problem and anxiety.   Anxiety  Presents for follow-up visit. Symptoms include depressed mood, excessive worry, irritability, nervous/anxious behavior, restlessness and shortness of breath. Symptoms occur most days. The severity of symptoms is moderate. The quality of sleep is good.    Hypertension  This is a recurrent problem. The current episode started more than 1 year ago. The problem has been waxing and waning since onset. The problem is uncontrolled. Associated symptoms include anxiety, malaise/fatigue and shortness of breath. Pertinent negatives include no peripheral edema. Past treatments include nothing. The current treatment provides no improvement. Hypertensive end-organ damage includes CAD/MI. There is no history of CVA or heart failure. Identifiable causes of hypertension include a thyroid problem.  COPD Pt taking Symbicort BID. Continues to smoke 1/2 pack a day. SOB worsening.     Review of Systems  Constitutional: Positive for fatigue, irritability and malaise/fatigue.  HENT: Positive for hoarse voice.   Respiratory: Positive for shortness of breath. Negative for cough.   Gastrointestinal: Positive for heartburn. Negative for diarrhea.  Psychiatric/Behavioral: Positive for depression. The patient is nervous/anxious.        Objective:   Physical Exam  Constitutional: She is oriented to person, place, and time. She appears well-developed and well-nourished. She is irritable. No distress.  HENT:  Head: Normocephalic and atraumatic.  Right Ear: External ear normal.  Mouth/Throat: Oropharynx is clear and moist.  Eyes: Pupils are equal, round, and reactive to light.  Neck: Normal range of motion. Neck supple. No thyromegaly present.  Cardiovascular: Normal rate, regular rhythm,  normal heart sounds and intact distal pulses.    No murmur heard. Pulmonary/Chest: Effort normal. No respiratory distress. She has no wheezes. She has rhonchi.  Abdominal: Soft. Bowel sounds are normal. She exhibits no distension. There is no tenderness.  Musculoskeletal: Normal range of motion. She exhibits edema (trace bilateral ankles). She exhibits no tenderness.  Neurological: She is alert and oriented to person, place, and time. She has normal reflexes. No cranial nerve deficit.  Skin: Skin is warm and dry.  Psychiatric: She has a normal mood and affect. Her behavior is normal. Judgment and thought content normal.  Vitals reviewed.     BP (!) 149/79   Pulse 66   Temp 98.1 F (36.7 C) (Oral)   Ht _0  (1.651 m)   Wt 175 lb 3.2 oz (79.5 kg)   BMI 29.15 kg/m      Assessment & Plan:  Elizabeth Brewer comes in today with chief complaint of Medical Management of Chronic Issues (three month recheck)   Diagnosis and orders addressed:  1. Migraine with aura and without status migrainosus, not intractable - CBC with Differential/Platelet - CMP14+EGFR  2. Chronic obstructive pulmonary disease, unspecified COPD type ([place]) - CBC with Differential/Platelet - CMP14+EGFR - benzonatate (TESSALON) 200 MG capsule; Take 1 capsule (200 mg total) by mouth 3 (three) times daily as needed.  Dispense: 90 capsule; Refill: 2  3. Gastroesophageal reflux disease, esophagitis presence not specified - CBC with Differential/Platelet - CMP14+EGFR  4. Hypothyroidism, unspecified type - CBC with Differential/Platelet - CMP14+EGFR  5. Hyperlipidemia, unspecified hyperlipidemia type - CBC with Differential/Platelet - CMP14+EGFR  6. Moderate episode of recurrent major depressive disorder (HCC) - CBC with Differential/Platelet - CMP14+EGFR  7. TOBACCO ABUSE - CBC with Differential/Platelet - CMP14+EGFR  8. Chronic neck pain - CBC with Differential/Platelet - CMP14+EGFR  9. Anxiety - CBC with Differential/Platelet - CMP14+EGFR -  clonazePAM (KLONOPIN) 1 MG tablet; TAKE 1 TABLET DAILY AS NEEDED FOR ANXIETY  Dispense: 30 tablet; Refill: 5  10. Vitamin D deficiency - CBC with Differential/Platelet - CMP14+EGFR  11. Overweight (BMI 25.0-29.9) - CBC with Differential/Platelet - CMP14+EGFR  12. ANXIETY DEPRESSION  13. Essential hypertension Will start HCTZ 12.5 mg today -Dash diet information given -Exercise encouraged - Stress Management  -Continue current meds -RTO in 1 month - hydrochlorothiazide (HYDRODIURIL) 12.5 MG tablet; Take 1 tablet (12.5 mg total) by mouth daily.  Dispense: 90 tablet; Refill: 3 - CBC with Differential/Platelet - CMP14+EGFR  14. Benzodiazepine dependence (HCC) - ToxASSURE Select 13 (MW), Urine - clonazePAM (KLONOPIN) 1 MG tablet; TAKE 1 TABLET DAILY AS NEEDED FOR ANXIETY  Dispense: 30 tablet; Refill: 5  15. Controlled substance agreement signed - ToxASSURE Select 13 (MW), Urine - clonazePAM (KLONOPIN) 1 MG tablet; TAKE 1 TABLET DAILY AS NEEDED FOR ANXIETY  Dispense: 30 tablet; Refill: 5   Labs pending Health Maintenance reviewed Diet and exercise encouraged  Follow up plan:  1 months to recheck HTN   [name], FNP

## 2018-06-17 ENCOUNTER — Other Ambulatory Visit: Payer: Self-pay | Admitting: Family

## 2018-06-17 LAB — CBC WITH DIFFERENTIAL/PLATELET
BASOS ABS: 0.1 10*3/uL (ref 0.0–0.2)
Basos: 1 %
EOS (ABSOLUTE): 0.3 10*3/uL (ref 0.0–0.4)
Eos: 4 %
HEMATOCRIT: 39.2 % (ref 34.0–46.6)
Hemoglobin: 12.8 g/dL (ref 11.1–15.9)
Immature Grans (Abs): 0.1 10*3/uL (ref 0.0–0.1)
Immature Granulocytes: 1 %
Lymphocytes Absolute: 2.3 10*3/uL (ref 0.7–3.1)
Lymphs: 26 %
MCH: 31 pg (ref 26.6–33.0)
MCHC: 32.7 g/dL (ref 31.5–35.7)
MCV: 95 fL (ref 79–97)
MONOS ABS: 0.5 10*3/uL (ref 0.1–0.9)
Monocytes: 6 %
Neutrophils Absolute: 5.7 10*3/uL (ref 1.4–7.0)
Neutrophils: 62 %
Platelets: 261 10*3/uL (ref 150–450)
RBC: 4.13 x10E6/uL (ref 3.77–5.28)
RDW: 13.3 % (ref 12.3–15.4)
WBC: 8.9 10*3/uL (ref 3.4–10.8)

## 2018-06-17 LAB — CMP14+EGFR
ALBUMIN: 4 g/dL (ref 3.5–5.5)
ALT: 10 IU/L (ref 0–32)
AST: 13 IU/L (ref 0–40)
Albumin/Globulin Ratio: 1.8 (ref 1.2–2.2)
Alkaline Phosphatase: 66 IU/L (ref 39–117)
BUN / CREAT RATIO: 14 (ref 9–23)
BUN: 8 mg/dL (ref 6–24)
Bilirubin Total: 0.2 mg/dL (ref 0.0–1.2)
CO2: 24 mmol/L (ref 20–29)
CREATININE: 0.56 mg/dL — AB (ref 0.57–1.00)
Calcium: 9 mg/dL (ref 8.7–10.2)
Chloride: 107 mmol/L — ABNORMAL HIGH (ref 96–106)
GFR, EST AFRICAN AMERICAN: 121 mL/min/{1.73_m2} (ref >59)
GFR, EST NON AFRICAN AMERICAN: 105 mL/min/{1.73_m2} (ref >59)
GLOBULIN, TOTAL: 2.2 g/dL (ref 1.5–4.5)
Glucose: 111 mg/dL — ABNORMAL HIGH (ref 65–99)
Potassium: 3.3 mmol/L — ABNORMAL LOW (ref 3.5–5.2)
SODIUM: 145 mmol/L — AB (ref 134–144)
TOTAL PROTEIN: 6.2 g/dL (ref 6.0–8.5)

## 2018-06-17 MED ORDER — POTASSIUM CHLORIDE ER 10 MEQ PO TBCR: 20.0000 meq | EXTENDED_RELEASE_TABLET | Freq: Every day | ORAL | 1 refills | Status: DC

## 2018-06-19 DIAGNOSIS — M25512 Pain in left shoulder: Secondary | ICD-10-CM | POA: Diagnosis not present

## 2018-06-19 DIAGNOSIS — M5481 Occipital neuralgia: Secondary | ICD-10-CM | POA: Diagnosis not present

## 2018-06-19 DIAGNOSIS — M542 Cervicalgia: Secondary | ICD-10-CM | POA: Diagnosis not present

## 2018-06-21 LAB — TOXASSURE SELECT 13 (MW), URINE

## 2018-06-30 ENCOUNTER — Encounter: Payer: Self-pay | Admitting: Family

## 2018-06-30 ENCOUNTER — Other Ambulatory Visit: Payer: Self-pay

## 2018-06-30 ENCOUNTER — Ambulatory Visit (INDEPENDENT_AMBULATORY_CARE_PROVIDER_SITE_OTHER): Payer: Medicare Other | Admitting: Family

## 2018-06-30 VITALS — BP 109/70 | HR 62 | Temp 97.4°F | Ht 65.0 in | Wt 171.6 lb

## 2018-06-30 DIAGNOSIS — Z0000 Encounter for general adult medical examination without abnormal findings: Secondary | ICD-10-CM
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
## 2018-06-30 NOTE — Patient Instructions (Signed)

## 2018-07-09 ENCOUNTER — Other Ambulatory Visit: Payer: Self-pay | Admitting: Family

## 2018-07-17 ENCOUNTER — Ambulatory Visit: Payer: Medicare Other | Admitting: Family

## 2018-07-22 ENCOUNTER — Ambulatory Visit: Payer: Medicare Other | Admitting: Family

## 2018-07-22 ENCOUNTER — Encounter: Payer: Self-pay | Admitting: Family

## 2018-07-22 ENCOUNTER — Ambulatory Visit (INDEPENDENT_AMBULATORY_CARE_PROVIDER_SITE_OTHER): Payer: Medicare Other | Admitting: Family

## 2018-07-22 VITALS — BP 124/65 | HR 63 | Temp 98.1°F | Ht 65.0 in | Wt 175.4 lb

## 2018-07-22 DIAGNOSIS — I10 Essential (primary) hypertension: Secondary | ICD-10-CM

## 2018-07-22 DIAGNOSIS — F331 Major depressive disorder, recurrent, moderate: Secondary | ICD-10-CM | POA: Diagnosis not present

## 2018-07-22 DIAGNOSIS — F419 Anxiety disorder, unspecified: Secondary | ICD-10-CM | POA: Diagnosis not present

## 2018-07-22 MED ORDER — DULOXETINE HCL 60 MG PO CPEP: 60.0000 mg | ORAL_CAPSULE | Freq: Every day | ORAL | 3 refills | Status: DC

## 2018-07-22 NOTE — Patient Instructions (Signed)
Living With Depression Everyone experiences occasional disappointment, sadness, and loss in their lives. When you are feeling down, blue, or sad for at least 2 weeks in a row, it may mean that you have depression. Depression can affect your thoughts and feelings, relationships, daily activities, and physical health. It is caused by changes in the way your brain functions. If you receive a diagnosis of depression, your health care provider will tell you which type of depression you have and what treatment options are available to you. If you are living with depression, there are ways to help you recover from it and also ways to prevent it from coming back. How to cope with lifestyle changes Coping with stress Stress is your body's reaction to life changes and events, both good and bad. Stressful situations may include:  Getting married.  The death of a spouse.  Losing a job.  Retiring.  Having a baby.  Stress can last just a few hours or it can be ongoing. Stress can play a major role in depression, so it is important to learn both how to cope with stress and how to think about it differently. Talk with your health care provider or a counselor if you would like to learn more about stress reduction. He or she may suggest some stress reduction techniques, such as:  Music therapy. This can include creating music or listening to music. Choose music that you enjoy and that inspires you.  Mindfulness-based meditation. This kind of meditation can be done while sitting or walking. It involves being aware of your normal breaths, rather than trying to control your breathing.  Centering prayer. This is a kind of meditation that involves focusing on a spiritual word or phrase. Choose a word, phrase, or sacred image that is meaningful to you and that brings you peace.  Deep breathing. To do this, expand your stomach and inhale slowly through your nose. Hold your breath for 3-5 seconds, then exhale  slowly, allowing your stomach muscles to relax.  Muscle relaxation. This involves intentionally tensing muscles then relaxing them.  Choose a stress reduction technique that fits your lifestyle and personality. Stress reduction techniques take time and practice to develop. Set aside 5-15 minutes a day to do them. Therapists can offer training in these techniques. The training may be covered by some insurance plans. Other things you can do to manage stress include:  Keeping a stress diary. This can help you learn what triggers your stress and ways to control your response.  Understanding what your limits are and saying no to requests or events that lead to a schedule that is too full.  Thinking about how you respond to certain situations. You may not be able to control everything, but you can control how you react.  Adding humor to your life by watching funny films or TV shows.  Making time for activities that help you relax and not feeling guilty about spending your time this way.  Medicines Your health care provider may suggest certain medicines if he or she feels that they will help improve your condition. Avoid using alcohol and other substances that may prevent your medicines from working properly (may interact). It is also important to:  Talk with your pharmacist or health care provider about all the medicines that you take, their possible side effects, and what medicines are safe to take together.  Make it your goal to take part in all treatment decisions (shared decision-making). This includes giving input on the side   effects of medicines. It is best if shared decision-making with your health care provider is part of your total treatment plan.  If your health care provider prescribes a medicine, you may not notice the full benefits of it for 4-8 weeks. Most people who are treated for depression need to be on medicine for at least 6-12 months after they feel better. If you are taking  medicines as part of your treatment, do not stop taking medicines without first talking to your health care provider. You may need to have the medicine slowly decreased (tapered) over time to decrease the risk of harmful side effects. Relationships Your health care provider may suggest family therapy along with individual therapy and drug therapy. While there may not be family problems that are causing you to feel depressed, it is still important to make sure your family learns as much as they can about your mental health. Having your family's support can help make your treatment successful. How to recognize changes in your condition Everyone has a different response to treatment for depression. Recovery from major depression happens when you have not had signs of major depression for two months. This may mean that you will start to:  Have more interest in doing activities.  Feel less hopeless than you did 2 months ago.  Have more energy.  Overeat less often, or have better or improving appetite.  Have better concentration.  Your health care provider will work with you to decide the next steps in your recovery. It is also important to recognize when your condition is getting worse. Watch for these signs:  Having fatigue or low energy.  Eating too much or too little.  Sleeping too much or too little.  Feeling restless, agitated, or hopeless.  Having trouble concentrating or making decisions.  Having unexplained physical complaints.  Feeling irritable, angry, or aggressive.  Get help as soon as you or your family members notice these symptoms coming back. How to get support and help from others How to talk with friends and family members about your condition Talking to friends and family members about your condition can provide you with one way to get support and guidance. Reach out to trusted friends or family members, explain your symptoms to them, and let them know that you are  working with a health care provider to treat your depression. Financial resources Not all insurance plans cover mental health care, so it is important to check with your insurance carrier. If paying for co-pays or counseling services is a problem, search for a local or county mental health care center. They may be able to offer public mental health care services at low or no cost when you are not able to see a private health care provider. If you are taking medicine for depression, you may be able to get the generic form, which may be less expensive. Some makers of prescription medicines also offer help to patients who cannot afford the medicines they need. Follow these instructions at home:  Get the right amount and quality of sleep.  Cut down on using caffeine, tobacco, alcohol, and other potentially harmful substances.  Try to exercise, such as walking or lifting small weights.  Take over-the-counter and prescription medicines only as told by your health care provider.  Eat a healthy diet that includes plenty of vegetables, fruits, whole grains, low-fat dairy products, and lean protein. Do not eat a lot of foods that are high in solid fats, added sugars, or salt.    Keep all follow-up visits as told by your health care provider. This is important. Contact a health care provider if:  You stop taking your antidepressant medicines, and you have any of these symptoms: ? Nausea. ? Headache. ? Feeling lightheaded. ? Chills and body aches. ? Not being able to sleep (insomnia).  You or your friends and family think your depression is getting worse. Get help right away if:  You have thoughts of hurting yourself or others. If you ever feel like you may hurt yourself or others, or have thoughts about taking your own life, get help right away. You can go to your nearest emergency department or call:  Your local emergency services (911 in the [place]  A suicide crisis helpline, such as the  National Suicide Prevention Lifeline at 1-800-273-8255. This is open 24-hours a day.  Summary  If you are living with depression, there are ways to help you recover from it and also ways to prevent it from coming back.  Work with your health care team to create a management plan that includes counseling, stress management techniques, and healthy lifestyle habits. This information is not intended to replace advice given to you by your health care provider. Make sure you discuss any questions you have with your health care provider. Document Released: 07/23/2016 Document Revised: 07/23/2016 Document Reviewed: 07/23/2016 Elsevier Interactive Patient Education  2018 [employer]  

## 2018-07-22 NOTE — Progress Notes (Signed)
Subjective:    Patient ID: Elizabeth Brewer, female    DOB: [birth year], 57 y.o.   MRN: [id number]  Chief Complaint  Patient presents with  . Hypertension   Pt presents to the office today to recheck Hypertension.  Hypertension  This is a chronic problem. The current episode started more than 1 year ago. The problem has been resolved since onset. The problem is controlled. Associated symptoms include anxiety and malaise/fatigue. Pertinent negatives include no peripheral edema or shortness of breath. The current treatment provides moderate improvement. There is no history of CAD/MI, CVA or heart failure.  Anxiety  Presents for follow-up visit. Symptoms include depressed mood, excessive worry, nervous/anxious behavior and restlessness. Patient reports no shortness of breath. Symptoms occur most days.    Depression         This is a chronic problem.  The current episode started more than 1 year ago.   The onset quality is gradual.   The problem occurs intermittently.  Associated symptoms include irritable, restlessness and sad.  Associated symptoms include no helplessness and no hopelessness.  Past treatments include SSRIs - Selective serotonin reuptake inhibitors.  Past medical history includes anxiety.       Review of Systems  Constitutional: Positive for malaise/fatigue.  Respiratory: Negative for shortness of breath.   Psychiatric/Behavioral: Positive for depression. The patient is nervous/anxious.   All other systems reviewed and are negative.      Objective:   Physical Exam  Constitutional: She is oriented to person, place, and time. She appears well-developed and well-nourished. She is irritable. No distress.  HENT:  Head: Normocephalic and atraumatic.  Right Ear: External ear normal.  Left Ear: External ear normal.  Mouth/Throat: Oropharynx is clear and moist.  Eyes: Pupils are equal, round, and reactive to light.  Neck: Normal range of motion. Neck supple. No thyromegaly  present.  Cardiovascular: Normal rate, regular rhythm, normal heart sounds and intact distal pulses.  No murmur heard. Pulmonary/Chest: Effort normal and breath sounds normal. No respiratory distress. She has no wheezes.  Abdominal: Soft. Bowel sounds are normal. She exhibits no distension. There is no tenderness.  Musculoskeletal: Normal range of motion. She exhibits no edema or tenderness.  Neurological: She is alert and oriented to person, place, and time. She has normal reflexes. No cranial nerve deficit.  Skin: Skin is warm and dry.  Psychiatric: She has a normal mood and affect. Her behavior is normal. Judgment and thought content normal.  Vitals reviewed.     BP 124/65   Pulse 63   Temp 98.1 F (36.7 C) (Oral)   Ht _0  (1.651 m)   Wt 175 lb 6.4 oz (79.6 kg)   BMI 29.19 kg/m      Assessment & Plan:  Kimiah H Czaplicki comes in today with chief complaint of Hypertension   Diagnosis and orders addressed:  1. Essential hypertension At goal today -Dash diet information given -Exercise encouraged - Stress Management  -Continue current meds - BMP8+EGFR  2. Moderate episode of recurrent major depressive disorder ([place]) Will stop Celexa and start Cymbalta today Stress management discussed RTO in 2 months for follow up  - DULoxetine (CYMBALTA) 60 MG capsule; Take 1 capsule (60 mg total) by mouth daily.  Dispense: 90 capsule; Refill: 3 - BMP8+EGFR  3. Anxiety Will stop Celexa and start Cymbalta today Stress management discussed RTO in 2 months for follow up  - DULoxetine (CYMBALTA) 60 MG capsule; Take 1 capsule (60 mg total) by mouth  daily.  Dispense: 90 capsule; Refill: 3 - BMP8+EGFR  [name], FNP

## 2018-08-08 ENCOUNTER — Other Ambulatory Visit: Payer: Self-pay | Admitting: Family

## 2018-09-04 ENCOUNTER — Other Ambulatory Visit: Payer: Self-pay | Admitting: Family

## 2018-09-23 ENCOUNTER — Ambulatory Visit: Payer: Medicare Other | Admitting: Family

## 2018-09-26 ENCOUNTER — Encounter: Payer: Self-pay | Admitting: Family

## 2018-09-26 ENCOUNTER — Ambulatory Visit (INDEPENDENT_AMBULATORY_CARE_PROVIDER_SITE_OTHER): Payer: Medicare Other | Admitting: Family

## 2018-09-26 VITALS — BP 115/65 | HR 66 | Temp 97.3°F | Ht 65.0 in | Wt 178.0 lb

## 2018-09-26 DIAGNOSIS — K5900 Constipation, unspecified: Secondary | ICD-10-CM | POA: Diagnosis not present

## 2018-09-26 DIAGNOSIS — F1320 Sedative, hypnotic or anxiolytic dependence, uncomplicated: Secondary | ICD-10-CM

## 2018-09-26 DIAGNOSIS — J449 Chronic obstructive pulmonary disease, unspecified: Secondary | ICD-10-CM

## 2018-09-26 DIAGNOSIS — E039 Hypothyroidism, unspecified: Secondary | ICD-10-CM

## 2018-09-26 DIAGNOSIS — E785 Hyperlipidemia, unspecified: Secondary | ICD-10-CM

## 2018-09-26 DIAGNOSIS — F419 Anxiety disorder, unspecified: Secondary | ICD-10-CM

## 2018-09-26 DIAGNOSIS — F331 Major depressive disorder, recurrent, moderate: Secondary | ICD-10-CM

## 2018-09-26 DIAGNOSIS — I10 Essential (primary) hypertension: Secondary | ICD-10-CM

## 2018-09-26 DIAGNOSIS — F17200 Nicotine dependence, unspecified, uncomplicated: Secondary | ICD-10-CM

## 2018-09-26 DIAGNOSIS — K219 Gastro-esophageal reflux disease without esophagitis: Secondary | ICD-10-CM

## 2018-09-26 DIAGNOSIS — Z79899 Other long term (current) drug therapy: Secondary | ICD-10-CM

## 2018-09-26 DIAGNOSIS — E663 Overweight: Secondary | ICD-10-CM

## 2018-09-26 DIAGNOSIS — G43109 Migraine with aura, not intractable, without status migrainosus: Secondary | ICD-10-CM

## 2018-09-26 MED ORDER — DULOXETINE HCL 60 MG PO CPEP: 60.0000 mg | ORAL_CAPSULE | Freq: Every day | ORAL | 3 refills | Status: DC

## 2018-09-26 MED ORDER — LINACLOTIDE 145 MCG PO CAPS: 145.0000 ug | ORAL_CAPSULE | Freq: Every day | ORAL | 6 refills | Status: DC

## 2018-09-26 MED ORDER — BUDESONIDE-FORMOTEROL FUMARATE 160-4.5 MCG/ACT IN AERO: 2.0000 | INHALATION_SPRAY | Freq: Two times a day (BID) | RESPIRATORY_TRACT | 3 refills | Status: DC

## 2018-09-26 MED ORDER — CLONAZEPAM 1 MG PO TABS: ORAL_TABLET | ORAL | 5 refills | Status: DC

## 2018-09-26 NOTE — Patient Instructions (Signed)

## 2018-09-26 NOTE — Progress Notes (Signed)
Subjective:    Patient ID: Elizabeth Brewer, female    DOB: 1961-07-06, 58 y.o.   MRN: [id number]  Chief Complaint  Patient presents with  . Medical Management of Chronic Issues   PT presents to the office todayfor chronic follow up. PT is followed by a neurologists every82month for chronic neck pain,seizure, and migraines. Hypertension  This is a chronic problem. The current episode started more than 1 year ago. The problem has been resolved since onset. The problem is controlled. Associated symptoms include anxiety, malaise/fatigue, peripheral edema ("a little bit") and shortness of breath. Risk factors for coronary artery disease include smoking/tobacco exposure and sedentary lifestyle. The current treatment provides moderate improvement. Identifiable causes of hypertension include a thyroid problem.  Gastroesophageal Reflux  She complains of belching, coughing, heartburn and a hoarse voice. This is a chronic problem. The current episode started more than 1 year ago. The problem occurs occasionally. The problem has been waxing and waning. The symptoms are aggravated by smoking. Associated symptoms include fatigue. Risk factors include obesity and smoking/tobacco exposure. She has tried a histamine-2 antagonist for the symptoms. The treatment provided mild relief.  Thyroid Problem  Presents for follow-up visit. Symptoms include anxiety, fatigue and hoarse voice. Patient reports no constipation or diarrhea. The symptoms have been stable. Her past medical history is significant for hyperlipidemia.  Hyperlipidemia  This is a chronic problem. The current episode started more than 1 year ago. The problem is controlled. Recent lipid tests were reviewed and are normal. Exacerbating diseases include obesity. Associated symptoms include shortness of breath. Current antihyperlipidemic treatment includes statins. The current treatment provides moderate improvement of lipids. Risk factors for coronary  artery disease include dyslipidemia, hypertension and a sedentary lifestyle.  Depression         This is a chronic problem.  The current episode started more than 1 year ago.   The onset quality is gradual.   The problem occurs intermittently.  The problem has been waxing and waning since onset.  Associated symptoms include fatigue, irritable, decreased interest and sad.  Associated symptoms include no helplessness and no hopelessness.  Past medical history includes thyroid problem and anxiety.   Anxiety  Presents for follow-up visit. Symptoms include excessive worry, irritability, nervous/anxious behavior and shortness of breath. Symptoms occur occasionally. The severity of symptoms is moderate.    Constipation  This is a chronic problem. The current episode started more than 1 year ago. The problem has been waxing and waning since onset. Her stool frequency is 2 to 3 times per week. Pertinent negatives include no diarrhea. She has tried laxatives for the symptoms. The treatment provided mild relief.  COPD Pt continues to smoke 1/2 a pack a day.    Review of Systems  Constitutional: Positive for fatigue, irritability and malaise/fatigue.  HENT: Positive for hoarse voice.   Respiratory: Positive for cough and shortness of breath.   Gastrointestinal: Positive for heartburn. Negative for constipation and diarrhea.  Psychiatric/Behavioral: Positive for depression. The patient is nervous/anxious.   All other systems reviewed and are negative.      Objective:   Physical Exam Vitals signs reviewed.  Constitutional:      General: She is irritable. She is not in acute distress.    Appearance: She is well-developed.  HENT:     Head: Normocephalic and atraumatic.     Right Ear: Tympanic membrane normal.     Left Ear: Tympanic membrane normal.  Eyes:     Pupils: Pupils are  equal, round, and reactive to light.  Neck:     Musculoskeletal: Normal range of motion and neck supple.     Thyroid:  No thyromegaly.  Cardiovascular:     Rate and Rhythm: Normal rate and regular rhythm.     Heart sounds: Normal heart sounds. No murmur.  Pulmonary:     Effort: Pulmonary effort is normal. No respiratory distress.     Breath sounds: Decreased breath sounds present. No wheezing.  Abdominal:     General: Bowel sounds are normal. There is no distension.     Palpations: Abdomen is soft.     Tenderness: There is no abdominal tenderness.  Musculoskeletal: Normal range of motion.        General: No tenderness.  Skin:    General: Skin is warm and dry.  Neurological:     Mental Status: She is alert and oriented to person, place, and time.     Cranial Nerves: No cranial nerve deficit.     Deep Tendon Reflexes: Reflexes are normal and symmetric.  Psychiatric:        Behavior: Behavior normal.        Thought Content: Thought content normal.        Judgment: Judgment normal.       BP 115/65   Pulse 66   Temp (!) 97.3 F (36.3 C) (Oral)   Ht '5\' 5"'$  (1.651 m)   Wt 178 lb (80.7 kg)   BMI 29.62 kg/m      Assessment & Plan:  Bernadean H Hadley comes in today with chief complaint of Medical Management of Chronic Issues   Diagnosis and orders addressed:  1. Essential hypertension - CMP14+EGFR - CBC with Differential/Platelet  2. Moderate episode of recurrent major depressive disorder (HCC) - DULoxetine (CYMBALTA) 60 MG capsule; Take 1 capsule (60 mg total) by mouth daily.  Dispense: 90 capsule; Refill: 3 - CMP14+EGFR - CBC with Differential/Platelet  3. Anxiety - DULoxetine (CYMBALTA) 60 MG capsule; Take 1 capsule (60 mg total) by mouth daily.  Dispense: 90 capsule; Refill: 3 - CMP14+EGFR - CBC with Differential/Platelet - clonazePAM (KLONOPIN) 1 MG tablet; TAKE 1 TABLET DAILY AS NEEDED FOR ANXIETY  Dispense: 30 tablet; Refill: 5  4. Constipation, unspecified constipation type - linaclotide (LINZESS) 145 MCG CAPS capsule; Take 1 capsule (145 mcg total) by mouth daily before  breakfast.  Dispense: 30 capsule; Refill: 6 - CMP14+EGFR - CBC with Differential/Platelet  5. Migraine with aura and without status migrainosus, not intractable - CMP14+EGFR - CBC with Differential/Platelet  6. Chronic obstructive pulmonary disease, unspecified COPD type (HCC) - budesonide-formoterol (SYMBICORT) 160-4.5 MCG/ACT inhaler; Inhale 2 puffs into the lungs 2 (two) times daily.  Dispense: 1 Inhaler; Refill: 3 - CMP14+EGFR - CBC with Differential/Platelet  7. Gastroesophageal reflux disease, esophagitis presence not specified - CMP14+EGFR - CBC with Differential/Platelet  8. Hypothyroidism, unspecified type - CMP14+EGFR - CBC with Differential/Platelet - TSH  9. Hyperlipidemia, unspecified hyperlipidemia type - CMP14+EGFR - CBC with Differential/Platelet  10. TOBACCO ABUSE - CMP14+EGFR - CBC with Differential/Platelet  11. Overweight (BMI 25.0-29.9) - CMP14+EGFR - CBC with Differential/Platelet  12. Benzodiazepine dependence (HCC) - CMP14+EGFR - CBC with Differential/Platelet - clonazePAM (KLONOPIN) 1 MG tablet; TAKE 1 TABLET DAILY AS NEEDED FOR ANXIETY  Dispense: 30 tablet; Refill: 5  13. Controlled substance agreement signed - CMP14+EGFR - CBC with Differential/Platelet - clonazePAM (KLONOPIN) 1 MG tablet; TAKE 1 TABLET DAILY AS NEEDED FOR ANXIETY  Dispense: 30 tablet; Refill: 5   Labs pending  Health Maintenance reviewed Diet and exercise encouraged  Follow up plan: 6 months    [name], FNP

## 2018-09-27 LAB — CBC WITH DIFFERENTIAL/PLATELET
BASOS: 1 %
Basophils Absolute: 0 10*3/uL (ref 0.0–0.2)
EOS (ABSOLUTE): 0.3 10*3/uL (ref 0.0–0.4)
Eos: 3 %
HEMOGLOBIN: 13.8 g/dL (ref 11.1–15.9)
Hematocrit: 39.3 % (ref 34.0–46.6)
IMMATURE GRANULOCYTES: 1 %
Immature Grans (Abs): 0 10*3/uL (ref 0.0–0.1)
LYMPHS ABS: 1.9 10*3/uL (ref 0.7–3.1)
Lymphs: 24 %
MCH: 33.2 pg — ABNORMAL HIGH (ref 26.6–33.0)
MCHC: 35.1 g/dL (ref 31.5–35.7)
MCV: 95 fL (ref 79–97)
MONOCYTES: 7 %
MONOS ABS: 0.6 10*3/uL (ref 0.1–0.9)
Neutrophils Absolute: 5.2 10*3/uL (ref 1.4–7.0)
Neutrophils: 64 %
Platelets: 221 10*3/uL (ref 150–450)
RBC: 4.16 x10E6/uL (ref 3.77–5.28)
RDW: 12.9 % (ref 11.7–15.4)
WBC: 8 10*3/uL (ref 3.4–10.8)

## 2018-09-27 LAB — CMP14+EGFR
ALBUMIN: 4.5 g/dL (ref 3.8–4.9)
ALT: 15 IU/L (ref 0–32)
AST: 20 IU/L (ref 0–40)
Albumin/Globulin Ratio: 2.1 (ref 1.2–2.2)
Alkaline Phosphatase: 74 IU/L (ref 39–117)
BUN / CREAT RATIO: 8 — AB (ref 9–23)
BUN: 6 mg/dL (ref 6–24)
Bilirubin Total: 0.3 mg/dL (ref 0.0–1.2)
CALCIUM: 9.6 mg/dL (ref 8.7–10.2)
CO2: 22 mmol/L (ref 20–29)
CREATININE: 0.74 mg/dL (ref 0.57–1.00)
Chloride: 98 mmol/L (ref 96–106)
GFR, EST AFRICAN AMERICAN: 104 mL/min/{1.73_m2} (ref >59)
GFR, EST NON AFRICAN AMERICAN: 90 mL/min/{1.73_m2} (ref >59)
GLOBULIN, TOTAL: 2.1 g/dL (ref 1.5–4.5)
GLUCOSE: 86 mg/dL (ref 65–99)
Potassium: 3.4 mmol/L — ABNORMAL LOW (ref 3.5–5.2)
SODIUM: 137 mmol/L (ref 134–144)
TOTAL PROTEIN: 6.6 g/dL (ref 6.0–8.5)

## 2018-09-27 LAB — TSH: TSH: 1.87 u[IU]/mL (ref 0.450–4.500)

## 2018-10-03 ENCOUNTER — Other Ambulatory Visit: Payer: Self-pay | Admitting: Family

## 2018-10-15 ENCOUNTER — Telehealth: Payer: Self-pay | Admitting: Family

## 2018-10-15 MED ORDER — CITALOPRAM HYDROBROMIDE 40 MG PO TABS: ORAL_TABLET | ORAL | 1 refills | Status: DC

## 2018-10-15 NOTE — Telephone Encounter (Signed)
Pt can stop Cymbalta and restart Celexa.

## 2018-10-15 NOTE — Telephone Encounter (Signed)
Patient aware.

## 2018-11-03 ENCOUNTER — Other Ambulatory Visit: Payer: Self-pay | Admitting: Family

## 2018-12-15 ENCOUNTER — Other Ambulatory Visit: Payer: Self-pay | Admitting: Family

## 2018-12-15 DIAGNOSIS — K219 Gastro-esophageal reflux disease without esophagitis: Secondary | ICD-10-CM

## 2019-01-08 ENCOUNTER — Other Ambulatory Visit: Payer: Self-pay | Admitting: *Deleted

## 2019-01-08 DIAGNOSIS — F1320 Sedative, hypnotic or anxiolytic dependence, uncomplicated: Secondary | ICD-10-CM

## 2019-01-08 DIAGNOSIS — F419 Anxiety disorder, unspecified: Secondary | ICD-10-CM

## 2019-01-08 DIAGNOSIS — Z79899 Other long term (current) drug therapy: Secondary | ICD-10-CM

## 2019-01-08 MED ORDER — LEVOTHYROXINE SODIUM 88 MCG PO TABS: ORAL_TABLET | ORAL | 2 refills | Status: DC

## 2019-01-08 MED ORDER — ROSUVASTATIN CALCIUM 20 MG PO TABS: 20.0000 mg | ORAL_TABLET | Freq: Every evening | ORAL | 0 refills | Status: DC

## 2019-01-13 ENCOUNTER — Encounter: Payer: Self-pay | Admitting: Internal Medicine

## 2019-03-11 ENCOUNTER — Other Ambulatory Visit: Payer: Self-pay | Admitting: Family

## 2019-03-11 NOTE — Telephone Encounter (Signed)
Patient has an appointment to see provider this month.

## 2019-03-18 ENCOUNTER — Other Ambulatory Visit: Payer: Self-pay | Admitting: Family

## 2019-03-18 DIAGNOSIS — I10 Essential (primary) hypertension: Secondary | ICD-10-CM

## 2019-03-26 ENCOUNTER — Other Ambulatory Visit: Payer: Self-pay

## 2019-03-27 ENCOUNTER — Ambulatory Visit (INDEPENDENT_AMBULATORY_CARE_PROVIDER_SITE_OTHER): Payer: Medicare Other | Admitting: Family

## 2019-03-27 ENCOUNTER — Encounter: Payer: Self-pay | Admitting: Family

## 2019-03-27 VITALS — BP 117/75 | HR 58 | Temp 97.9°F | Ht 65.0 in | Wt 173.6 lb

## 2019-03-27 DIAGNOSIS — F1320 Sedative, hypnotic or anxiolytic dependence, uncomplicated: Secondary | ICD-10-CM

## 2019-03-27 DIAGNOSIS — E559 Vitamin D deficiency, unspecified: Secondary | ICD-10-CM

## 2019-03-27 DIAGNOSIS — F331 Major depressive disorder, recurrent, moderate: Secondary | ICD-10-CM

## 2019-03-27 DIAGNOSIS — F17200 Nicotine dependence, unspecified, uncomplicated: Secondary | ICD-10-CM

## 2019-03-27 DIAGNOSIS — G43109 Migraine with aura, not intractable, without status migrainosus: Secondary | ICD-10-CM | POA: Diagnosis not present

## 2019-03-27 DIAGNOSIS — K219 Gastro-esophageal reflux disease without esophagitis: Secondary | ICD-10-CM | POA: Diagnosis not present

## 2019-03-27 DIAGNOSIS — J449 Chronic obstructive pulmonary disease, unspecified: Secondary | ICD-10-CM

## 2019-03-27 DIAGNOSIS — K5900 Constipation, unspecified: Secondary | ICD-10-CM

## 2019-03-27 DIAGNOSIS — E663 Overweight: Secondary | ICD-10-CM

## 2019-03-27 DIAGNOSIS — E039 Hypothyroidism, unspecified: Secondary | ICD-10-CM

## 2019-03-27 DIAGNOSIS — F419 Anxiety disorder, unspecified: Secondary | ICD-10-CM

## 2019-03-27 DIAGNOSIS — Z79899 Other long term (current) drug therapy: Secondary | ICD-10-CM

## 2019-03-27 DIAGNOSIS — R5383 Other fatigue: Secondary | ICD-10-CM

## 2019-03-27 DIAGNOSIS — I10 Essential (primary) hypertension: Secondary | ICD-10-CM | POA: Diagnosis not present

## 2019-03-27 DIAGNOSIS — E785 Hyperlipidemia, unspecified: Secondary | ICD-10-CM

## 2019-03-27 MED ORDER — OMEPRAZOLE 20 MG PO CPDR: 20.0000 mg | DELAYED_RELEASE_CAPSULE | Freq: Every day | ORAL | 3 refills | Status: DC

## 2019-03-27 MED ORDER — LUBIPROSTONE 8 MCG PO CAPS: 8.0000 ug | ORAL_CAPSULE | Freq: Two times a day (BID) | ORAL | 2 refills | Status: DC

## 2019-03-27 MED ORDER — FLUOXETINE HCL 20 MG PO CAPS: 20.0000 mg | ORAL_CAPSULE | Freq: Every day | ORAL | 3 refills | Status: DC

## 2019-03-27 MED ORDER — CLONAZEPAM 1 MG PO TABS: ORAL_TABLET | ORAL | 5 refills | Status: DC

## 2019-03-27 MED ORDER — BREO ELLIPTA 100-25 MCG/INH IN AEPB: 1.0000 | INHALATION_SPRAY | Freq: Every day | RESPIRATORY_TRACT | 6 refills | Status: DC

## 2019-03-27 NOTE — Patient Instructions (Signed)

## 2019-03-27 NOTE — Addendum Note (Signed)
Addended by: [name] A on: 03/27/2019 01:57 PM   Modules accepted: Orders

## 2019-03-27 NOTE — Progress Notes (Signed)
Subjective:    Patient ID: Elizabeth Brewer, female    DOB: [birth year], 58 y.o.   MRN: [id number]  Chief Complaint  Patient presents with  . Medical Management of Chronic Issues    needs new script for GERD,(ranitidine discontinued)   PT presents to the office todayfor chronic follow up. PT is followed by a neurologists every80month for chronic neck pain,seizure, and migraines. Hypertension This is a chronic problem. The current episode started more than 1 year ago. The problem has been resolved since onset. The problem is controlled. Associated symptoms include anxiety and malaise/fatigue. Pertinent negatives include no peripheral edema or shortness of breath. Risk factors for coronary artery disease include dyslipidemia, obesity and sedentary lifestyle. The current treatment provides moderate improvement. There is no history of CAD/MI. Identifiable causes of hypertension include a thyroid problem.  Gastroesophageal Reflux She complains of belching, heartburn and a hoarse voice. This is a chronic problem. The current episode started more than 1 year ago. The problem occurs occasionally. The problem has been waxing and waning. Associated symptoms include fatigue. Risk factors include obesity. She has tried a PPI for the symptoms. The treatment provided moderate relief.  Thyroid Problem Presents for follow-up visit. Symptoms include anxiety, constipation, depressed mood, fatigue and hoarse voice. The symptoms have been stable. Her past medical history is significant for hyperlipidemia.  Hyperlipidemia This is a chronic problem. The current episode started more than 1 year ago. The problem is controlled. Recent lipid tests were reviewed and are normal. Exacerbating diseases include obesity. Pertinent negatives include no shortness of breath. Current antihyperlipidemic treatment includes statins. The current treatment provides moderate improvement of lipids. Risk factors for coronary artery  disease include dyslipidemia, hypertension, a sedentary lifestyle and post-menopausal.  Depression        This is a chronic problem.  The current episode started more than 1 year ago.   The onset quality is gradual.   The problem occurs intermittently.  Associated symptoms include fatigue, insomnia and restlessness.  Past medical history includes thyroid problem and anxiety.   Anxiety Presents for follow-up visit. Symptoms include depressed mood, excessive worry, insomnia, irritability, nervous/anxious behavior and restlessness. Patient reports no shortness of breath. The quality of sleep is good.    COPD Continues to smoke a pack a day. Uses Symbicort BID usually, but states she had trouble getting her last rx.     Review of Systems  Constitutional: Positive for fatigue, irritability and malaise/fatigue.  HENT: Positive for hoarse voice.   Respiratory: Negative for shortness of breath.   Gastrointestinal: Positive for constipation and heartburn.  Psychiatric/Behavioral: Positive for depression. The patient is nervous/anxious and has insomnia.   All other systems reviewed and are negative.      Objective:   Physical Exam Vitals signs reviewed.  Constitutional:      General: She is not in acute distress.    Appearance: She is well-developed.  HENT:     Head: Normocephalic and atraumatic.     Right Ear: Tympanic membrane normal.     Left Ear: Tympanic membrane normal.  Eyes:     Pupils: Pupils are equal, round, and reactive to light.  Neck:     Musculoskeletal: Normal range of motion and neck supple.     Thyroid: No thyromegaly.  Cardiovascular:     Rate and Rhythm: Normal rate and regular rhythm.     Heart sounds: Normal heart sounds. No murmur.  Pulmonary:     Effort: Pulmonary effort is normal. No  respiratory distress.     Breath sounds: Normal breath sounds. No wheezing.  Abdominal:     General: Bowel sounds are normal. There is no distension.     Palpations: Abdomen is  soft.     Tenderness: There is no abdominal tenderness.  Musculoskeletal: Normal range of motion.        General: No tenderness.  Skin:    General: Skin is warm and dry.  Neurological:     Mental Status: She is alert and oriented to person, place, and time.     Cranial Nerves: No cranial nerve deficit.     Deep Tendon Reflexes: Reflexes are normal and symmetric.  Psychiatric:        Behavior: Behavior normal.        Thought Content: Thought content normal.        Judgment: Judgment normal.       BP 117/75   Pulse (!) 58   Temp 97.9 F (36.6 C) (Oral)   Ht _0  (1.651 m)   Wt 173 lb 9.6 oz (78.7 kg)   BMI 28.89 kg/m      Assessment & Plan:  Elizabeth Brewer comes in today with chief complaint of Medical Management of Chronic Issues (needs new script for GERD,(ranitidine discontinued))   Diagnosis and orders addressed:  1. Essential hypertension - CMP14+EGFR  2. Migraine with aura and without status migrainosus, not intractable - CMP14+EGFR  3. Chronic obstructive pulmonary disease, unspecified COPD type ([place]) Will change Symbicort to Breo  Smoking cessation discussed - fluticasone furoate-vilanterol (BREO ELLIPTA) 100-25 MCG/INH AEPB; Inhale 1 puff into the lungs daily.  Dispense: 1 each; Refill: 6 - CMP14+EGFR  4. Gastroesophageal reflux disease, esophagitis presence not specified - CMP14+EGFR  5. Hypothyroidism, unspecified type - CMP14+EGFR - TSH  6. TOBACCO ABUSE - CMP14+EGFR  7. Vitamin D deficiency - CMP14+EGFR - VITAMIN D 25 Hydroxy (Vit-D Deficiency, Fractures)  8. Overweight (BMI 25.0-29.9) - CMP14+EGFR  9. Hyperlipidemia, unspecified hyperlipidemia type - CMP14+EGFR - Lipid panel  10. Moderate episode of recurrent major depressive disorder (HCC) Will add Prozac today - CMP14+EGFR - FLUoxetine (PROZAC) 20 MG capsule; Take 1 capsule (20 mg total) by mouth daily.  Dispense: 90 capsule; Refill: 3  11. Constipation, unspecified constipation  type Will change Linzess to Amitiza to see if better coverage - lubiprostone (AMITIZA) 8 MCG capsule; Take 1 capsule (8 mcg total) by mouth 2 (two) times daily with a meal.  Dispense: 60 capsule; Refill: 2 - CMP14+EGFR  12. Anxiety Will add Prozac today Discussed we could not increase Klonopin  Pt reviewed in [place] controlled database- No red flags, pt is on chronic pain - clonazePAM (KLONOPIN) 1 MG tablet; TAKE 1 TABLET DAILY AS NEEDED FOR ANXIETY  Dispense: 30 tablet; Refill: 5 - CMP14+EGFR - FLUoxetine (PROZAC) 20 MG capsule; Take 1 capsule (20 mg total) by mouth daily.  Dispense: 90 capsule; Refill: 3  13. Benzodiazepine dependence (HCC) - clonazePAM (KLONOPIN) 1 MG tablet; TAKE 1 TABLET DAILY AS NEEDED FOR ANXIETY  Dispense: 30 tablet; Refill: 5 - CMP14+EGFR - FLUoxetine (PROZAC) 20 MG capsule; Take 1 capsule (20 mg total) by mouth daily.  Dispense: 90 capsule; Refill: 3  14. Controlled substance agreement signed - clonazePAM (KLONOPIN) 1 MG tablet; TAKE 1 TABLET DAILY AS NEEDED FOR ANXIETY  Dispense: 30 tablet; Refill: 5 - CMP14+EGFR  15. Fatigue, unspecified type - CMP14+EGFR - Anemia Profile B   Labs pending Health Maintenance reviewed Diet and exercise encouraged  Follow  up plan: 4 weeks to recheck GAD and depression    [name], FNP

## 2019-03-28 LAB — CMP14+EGFR
ALT: 8 IU/L (ref 0–32)
AST: 12 IU/L (ref 0–40)
Albumin/Globulin Ratio: 1.7 (ref 1.2–2.2)
Albumin: 4.3 g/dL (ref 3.8–4.9)
Alkaline Phosphatase: 72 IU/L (ref 39–117)
BUN/Creatinine Ratio: 12 (ref 9–23)
BUN: 10 mg/dL (ref 6–24)
Bilirubin Total: 0.4 mg/dL (ref 0.0–1.2)
CO2: 25 mmol/L (ref 20–29)
Calcium: 9.3 mg/dL (ref 8.7–10.2)
Chloride: 100 mmol/L (ref 96–106)
Creatinine, Ser: 0.85 mg/dL (ref 0.57–1.00)
GFR calc Af Amer: 88 mL/min/{1.73_m2} (ref >59)
GFR calc non Af Amer: 76 mL/min/{1.73_m2} (ref >59)
Globulin, Total: 2.5 g/dL (ref 1.5–4.5)
Glucose: 94 mg/dL (ref 65–99)
Potassium: 2.9 mmol/L — ABNORMAL LOW (ref 3.5–5.2)
Sodium: 142 mmol/L (ref 134–144)
Total Protein: 6.8 g/dL (ref 6.0–8.5)

## 2019-03-28 LAB — ANEMIA PROFILE B
Basophils Absolute: 0.1 10*3/uL (ref 0.0–0.2)
Basos: 1 %
EOS (ABSOLUTE): 0.2 10*3/uL (ref 0.0–0.4)
Eos: 3 %
Ferritin: 357 ng/mL — ABNORMAL HIGH (ref 15–150)
Folate: 2.9 ng/mL — ABNORMAL LOW (ref >3.0)
Hematocrit: 40.4 % (ref 34.0–46.6)
Hemoglobin: 13.8 g/dL (ref 11.1–15.9)
Immature Grans (Abs): 0 10*3/uL (ref 0.0–0.1)
Immature Granulocytes: 0 %
Iron Saturation: 22 % (ref 15–55)
Iron: 58 ug/dL (ref 27–159)
Lymphocytes Absolute: 2.1 10*3/uL (ref 0.7–3.1)
Lymphs: 26 %
MCH: 32.5 pg (ref 26.6–33.0)
MCHC: 34.2 g/dL (ref 31.5–35.7)
MCV: 95 fL (ref 79–97)
Monocytes Absolute: 0.5 10*3/uL (ref 0.1–0.9)
Monocytes: 6 %
Neutrophils Absolute: 5.4 10*3/uL (ref 1.4–7.0)
Neutrophils: 64 %
Platelets: 274 10*3/uL (ref 150–450)
RBC: 4.24 x10E6/uL (ref 3.77–5.28)
RDW: 14 % (ref 11.7–15.4)
Retic Ct Pct: 1.2 % (ref 0.6–2.6)
Total Iron Binding Capacity: 265 ug/dL (ref 250–450)
UIBC: 207 ug/dL (ref 131–425)
Vitamin B-12: 481 pg/mL (ref 232–1245)
WBC: 8.4 10*3/uL (ref 3.4–10.8)

## 2019-03-28 LAB — LIPID PANEL
Chol/HDL Ratio: 2.8 ratio (ref 0.0–4.4)
Cholesterol, Total: 125 mg/dL (ref 100–199)
HDL: 45 mg/dL (ref >39)
LDL Calculated: 60 mg/dL (ref 0–99)
Triglycerides: 102 mg/dL (ref 0–149)
VLDL Cholesterol Cal: 20 mg/dL (ref 5–40)

## 2019-03-28 LAB — TSH: TSH: 1.28 u[IU]/mL (ref 0.450–4.500)

## 2019-03-28 LAB — VITAMIN D 25 HYDROXY (VIT D DEFICIENCY, FRACTURES): Vit D, 25-Hydroxy: 48.5 ng/mL (ref 30.0–100.0)

## 2019-03-30 ENCOUNTER — Other Ambulatory Visit: Payer: Self-pay | Admitting: Family

## 2019-03-30 DIAGNOSIS — E538 Deficiency of other specified B group vitamins: Secondary | ICD-10-CM | POA: Insufficient documentation

## 2019-03-30 DIAGNOSIS — E876 Hypokalemia: Secondary | ICD-10-CM

## 2019-03-30 MED ORDER — FOLIC ACID 1 MG PO TABS: 3.0000 mg | ORAL_TABLET | Freq: Every day | ORAL | 3 refills | Status: DC

## 2019-03-30 MED ORDER — POTASSIUM CHLORIDE ER 20 MEQ PO TBCR: 20.0000 ug | EXTENDED_RELEASE_TABLET | Freq: Two times a day (BID) | ORAL | 3 refills | Status: DC

## 2019-04-16 ENCOUNTER — Other Ambulatory Visit: Payer: Self-pay | Admitting: Family

## 2019-04-27 ENCOUNTER — Other Ambulatory Visit: Payer: Self-pay

## 2019-04-28 ENCOUNTER — Ambulatory Visit (INDEPENDENT_AMBULATORY_CARE_PROVIDER_SITE_OTHER): Payer: Medicare Other | Admitting: Family

## 2019-04-28 ENCOUNTER — Encounter: Payer: Self-pay | Admitting: Family

## 2019-04-28 VITALS — BP 156/75 | HR 65 | Temp 98.4°F | Ht 65.0 in | Wt 179.2 lb

## 2019-04-28 DIAGNOSIS — F419 Anxiety disorder, unspecified: Secondary | ICD-10-CM | POA: Diagnosis not present

## 2019-04-28 DIAGNOSIS — E876 Hypokalemia: Secondary | ICD-10-CM | POA: Diagnosis not present

## 2019-04-28 DIAGNOSIS — F17200 Nicotine dependence, unspecified, uncomplicated: Secondary | ICD-10-CM

## 2019-04-28 MED ORDER — FLUOXETINE HCL 40 MG PO CAPS: 40.0000 mg | ORAL_CAPSULE | Freq: Every day | ORAL | 3 refills | Status: DC

## 2019-04-28 NOTE — Progress Notes (Signed)
   Subjective:     Patient ID: Elizabeth Brewer, female    DOB: [birth year], 58 y.o.   MRN: [id number]  Chief Complaint  Patient presents with  . Anxiety    recheck   Pt presents to the office today to recheck GAD. Pt was started on Prozac 20 mg. She states she can not tell any difference. She is currently caring for a 58 year old and this is causing her a great deal of stress.   She was also found to be hypokalemia. She was started on oral potassium 20 meq BID. States she has been tolerating well.   Anxiety Presents for follow-up visit. Symptoms include decreased concentration, depressed mood, excessive worry, insomnia, irritability and nervous/anxious behavior. Symptoms occur occasionally. The severity of symptoms is moderate. The quality of sleep is good.        Review of Systems  Constitutional: Positive for irritability.  Psychiatric/Behavioral: Positive for decreased concentration. The patient is nervous/anxious and has insomnia.   All other systems reviewed and are negative.      Objective:   Physical Exam Vitals signs reviewed.  Constitutional:      General: She is not in acute distress.    Appearance: She is well-developed.  HENT:     Head: Normocephalic and atraumatic.  Eyes:     Pupils: Pupils are equal, round, and reactive to light.  Neck:     Musculoskeletal: Normal range of motion and neck supple.     Thyroid: No thyromegaly.  Cardiovascular:     Rate and Rhythm: Normal rate and regular rhythm.     Heart sounds: Normal heart sounds. No murmur.  Pulmonary:     Effort: Pulmonary effort is normal. No respiratory distress.     Breath sounds: Decreased breath sounds present. No wheezing.  Abdominal:     General: Bowel sounds are normal. There is no distension.     Palpations: Abdomen is soft.     Tenderness: There is no abdominal tenderness.  Musculoskeletal: Normal range of motion.        General: No tenderness.  Skin:    General: Skin is warm and dry.   Neurological:     Mental Status: She is alert and oriented to person, place, and time.     Cranial Nerves: No cranial nerve deficit.     Deep Tendon Reflexes: Reflexes are normal and symmetric.  Psychiatric:        Behavior: Behavior normal.        Thought Content: Thought content normal.        Judgment: Judgment normal.     BP (!) 156/75   Pulse 65   Temp 98.4 F (36.9 C) (Oral)   Ht _0  (1.651 m)   Wt 179 lb 3.2 oz (81.3 kg)   BMI 29.82 kg/m      Assessment & Plan:  Elizabeth Brewer comes in today with chief complaint of Anxiety (recheck)   Diagnosis and orders addressed:  1. Anxiety Pt increased to Prozac to 40 mg from 20 mg  Stress management discussed RTO in 4-6 weeks  - BMP8+EGFR - FLUoxetine (PROZAC) 40 MG capsule; Take 1 capsule (40 mg total) by mouth daily.  Dispense: 90 capsule; Refill: 3  2. Current smoker Smoking cessation  - BMP8+EGFR  3. Hypokalemia - BMP8+EGFR    Follow up plan: 6 weeks    Elizabeth Dun, FNP

## 2019-04-28 NOTE — Patient Instructions (Signed)

## 2019-04-29 LAB — BMP8+EGFR
BUN/Creatinine Ratio: 10 (ref 9–23)
BUN: 8 mg/dL (ref 6–24)
CO2: 25 mmol/L (ref 20–29)
Calcium: 9.7 mg/dL (ref 8.7–10.2)
Chloride: 96 mmol/L (ref 96–106)
Creatinine, Ser: 0.84 mg/dL (ref 0.57–1.00)
GFR calc Af Amer: 89 mL/min/{1.73_m2} (ref >59)
GFR calc non Af Amer: 77 mL/min/{1.73_m2} (ref >59)
Glucose: 96 mg/dL (ref 65–99)
Potassium: 3.8 mmol/L (ref 3.5–5.2)
Sodium: 134 mmol/L (ref 134–144)

## 2019-05-20 ENCOUNTER — Other Ambulatory Visit (HOSPITAL_COMMUNITY): Payer: Self-pay | Admitting: Family

## 2019-05-20 DIAGNOSIS — Z1231 Encounter for screening mammogram for malignant neoplasm of breast: Secondary | ICD-10-CM

## 2019-05-22 ENCOUNTER — Other Ambulatory Visit: Payer: Self-pay

## 2019-05-22 ENCOUNTER — Ambulatory Visit (HOSPITAL_COMMUNITY)
Admission: RE | Admit: 2019-05-22 | Discharge: 2019-05-22 | Disposition: A | Discharge status: Home / Self Care | Payer: Medicare Other | Source: Ambulatory Visit | Attending: Family | Admitting: Family

## 2019-05-22 DIAGNOSIS — Z1231 Encounter for screening mammogram for malignant neoplasm of breast: Secondary | ICD-10-CM

## 2019-06-11 ENCOUNTER — Ambulatory Visit: Payer: Medicare Other | Admitting: Family

## 2019-06-18 ENCOUNTER — Encounter: Payer: Self-pay | Admitting: Family

## 2019-06-18 ENCOUNTER — Ambulatory Visit (INDEPENDENT_AMBULATORY_CARE_PROVIDER_SITE_OTHER): Payer: Medicare Other | Admitting: Family

## 2019-06-18 DIAGNOSIS — F419 Anxiety disorder, unspecified: Secondary | ICD-10-CM | POA: Diagnosis not present

## 2019-06-18 DIAGNOSIS — Z79899 Other long term (current) drug therapy: Secondary | ICD-10-CM

## 2019-06-18 DIAGNOSIS — F1320 Sedative, hypnotic or anxiolytic dependence, uncomplicated: Secondary | ICD-10-CM | POA: Diagnosis not present

## 2019-06-18 MED ORDER — CLONAZEPAM 1 MG PO TABS: ORAL_TABLET | ORAL | 5 refills | Status: DC

## 2019-06-18 MED ORDER — CITALOPRAM HYDROBROMIDE 40 MG PO TABS: 40.0000 mg | ORAL_TABLET | Freq: Every day | ORAL | 1 refills | Status: DC

## 2019-06-18 MED ORDER — HYDROXYZINE PAMOATE 25 MG PO CAPS: 25.0000 mg | ORAL_CAPSULE | Freq: Three times a day (TID) | ORAL | 1 refills | Status: DC | PRN

## 2019-06-18 NOTE — Progress Notes (Signed)
Virtual Visit via telephone Note Due to COVID-19 pandemic this visit was conducted virtually. This visit type was conducted due to national recommendations for restrictions regarding the COVID-19 Pandemic (e.g. social distancing, sheltering in place) in an effort to limit this patient's exposure and mitigate transmission in our community. All issues noted in this document were discussed and addressed.  A physical exam was not performed with this format.  I connected with Elizabeth Brewer on 06/18/19 at 11:34 AM by telephone and verified that I am speaking with the correct person using two identifiers. Elizabeth Brewer is currently located at home and no one is currently with her during visit. The provider, [name], FNP is located in their office at time of visit.  I discussed the limitations, risks, security and privacy concerns of performing an evaluation and management service by telephone and the availability of in person appointments. I also discussed with the patient that there may be a patient responsible charge related to this service. The patient expressed understanding and agreed to proceed.   History and Present Illness:  PT calls the office today to discuss GAD. On her last visit we increased her Prozac 20 mg to 40 mg. She states she did not like how it made her feel and made her angry. She stopped it and went back on her Celexa. She continues to have anxiety.  Anxiety Presents for follow-up visit. Symptoms include decreased concentration, depressed mood, excessive worry, hyperventilation, irritability, nervous/anxious behavior, palpitations and restlessness. Symptoms occur most days. The severity of symptoms is moderate. The quality of sleep is good.    Depression        This is a chronic problem.  The current episode started more than 1 year ago.   The onset quality is gradual.   The problem occurs intermittently.  Associated symptoms include decreased concentration, irritable,  restlessness and sad.  Associated symptoms include no helplessness and no hopelessness.  Past medical history includes anxiety.       Review of Systems  Constitutional: Positive for irritability.  Cardiovascular: Positive for palpitations.  Psychiatric/Behavioral: Positive for decreased concentration and depression. The patient is nervous/anxious.      Observations/Objective: No SOB or distress noted  Assessment and Plan: 1. Anxiety - citalopram (CELEXA) 40 MG tablet; Take 1 tablet (40 mg total) by mouth daily.  Dispense: 90 tablet; Refill: 1 - clonazePAM (KLONOPIN) 1 MG tablet; TAKE 1 TABLET DAILY AS NEEDED FOR ANXIETY  Dispense: 30 tablet; Refill: 5 - hydrOXYzine (VISTARIL) 25 MG capsule; Take 1 capsule (25 mg total) by mouth 3 (three) times daily as needed.  Dispense: 90 capsule; Refill: 1  2. Benzodiazepine dependence (HCC) - citalopram (CELEXA) 40 MG tablet; Take 1 tablet (40 mg total) by mouth daily.  Dispense: 90 tablet; Refill: 1 - clonazePAM (KLONOPIN) 1 MG tablet; TAKE 1 TABLET DAILY AS NEEDED FOR ANXIETY  Dispense: 30 tablet; Refill: 5 - hydrOXYzine (VISTARIL) 25 MG capsule; Take 1 capsule (25 mg total) by mouth 3 (three) times daily as needed.  Dispense: 90 capsule; Refill: 1  3. Controlled substance agreement signed - clonazePAM (KLONOPIN) 1 MG tablet; TAKE 1 TABLET DAILY AS NEEDED FOR ANXIETY  Dispense: 30 tablet; Refill: 5  Continue Celexa daily and klonopin as needed Will add vistaril as needed TID Stress management discussed RTO in 3 months     I discussed the assessment and treatment plan with the patient. The patient was provided an opportunity to ask questions and all were answered.  The patient agreed with the plan and demonstrated an understanding of the instructions.   The patient was advised to call back or seek an in-person evaluation if the symptoms worsen or if the condition fails to improve as anticipated.  The above assessment and management plan was  discussed with the patient. The patient verbalized understanding of and has agreed to the management plan. Patient is aware to call the clinic if symptoms persist or worsen. Patient is aware when to return to the clinic for a follow-up visit. Patient educated on when it is appropriate to go to the emergency department.   Time call ended: 11:50 AM   I provided 16 minutes of non-face-to-face time during this encounter.    [name], FNP

## 2019-06-24 ENCOUNTER — Other Ambulatory Visit: Payer: Self-pay | Admitting: Family

## 2019-06-24 DIAGNOSIS — I10 Essential (primary) hypertension: Secondary | ICD-10-CM

## 2019-06-30 ENCOUNTER — Ambulatory Visit: Payer: Medicare Other | Admitting: Family

## 2019-06-30 ENCOUNTER — Emergency Department (HOSPITAL_COMMUNITY): Payer: Medicare Other

## 2019-06-30 ENCOUNTER — Ambulatory Visit (INDEPENDENT_AMBULATORY_CARE_PROVIDER_SITE_OTHER): Payer: Medicare Other | Admitting: Family Medicine

## 2019-06-30 ENCOUNTER — Emergency Department (HOSPITAL_COMMUNITY)
Admission: EM | Admit: 2019-06-30 | Discharge: 2019-06-30 | Disposition: A | Discharge status: Home / Self Care | Payer: Medicare Other | Attending: Emergency Medicine | Admitting: Emergency Medicine

## 2019-06-30 ENCOUNTER — Encounter: Payer: Self-pay | Admitting: Family Medicine

## 2019-06-30 ENCOUNTER — Encounter (HOSPITAL_COMMUNITY): Payer: Self-pay

## 2019-06-30 ENCOUNTER — Other Ambulatory Visit: Payer: Self-pay

## 2019-06-30 VITALS — BP 130/72 | HR 67 | Temp 97.7°F | Ht 65.0 in | Wt 186.4 lb

## 2019-06-30 DIAGNOSIS — M5441 Lumbago with sciatica, right side: Secondary | ICD-10-CM | POA: Diagnosis not present

## 2019-06-30 DIAGNOSIS — I10 Essential (primary) hypertension: Secondary | ICD-10-CM | POA: Insufficient documentation

## 2019-06-30 DIAGNOSIS — W19XXXA Unspecified fall, initial encounter: Secondary | ICD-10-CM

## 2019-06-30 DIAGNOSIS — R519 Headache, unspecified: Secondary | ICD-10-CM | POA: Diagnosis not present

## 2019-06-30 DIAGNOSIS — J449 Chronic obstructive pulmonary disease, unspecified: Secondary | ICD-10-CM | POA: Diagnosis not present

## 2019-06-30 DIAGNOSIS — Y9289 Other specified places as the place of occurrence of the external cause: Secondary | ICD-10-CM | POA: Insufficient documentation

## 2019-06-30 DIAGNOSIS — Z79899 Other long term (current) drug therapy: Secondary | ICD-10-CM | POA: Diagnosis not present

## 2019-06-30 DIAGNOSIS — R220 Localized swelling, mass and lump, head: Secondary | ICD-10-CM | POA: Insufficient documentation

## 2019-06-30 DIAGNOSIS — M5442 Lumbago with sciatica, left side: Secondary | ICD-10-CM | POA: Diagnosis not present

## 2019-06-30 DIAGNOSIS — Z23 Encounter for immunization: Secondary | ICD-10-CM

## 2019-06-30 DIAGNOSIS — S0093XA Contusion of unspecified part of head, initial encounter: Secondary | ICD-10-CM | POA: Diagnosis not present

## 2019-06-30 DIAGNOSIS — Y998 Other external cause status: Secondary | ICD-10-CM | POA: Diagnosis not present

## 2019-06-30 DIAGNOSIS — E039 Hypothyroidism, unspecified: Secondary | ICD-10-CM | POA: Insufficient documentation

## 2019-06-30 DIAGNOSIS — G8929 Other chronic pain: Secondary | ICD-10-CM

## 2019-06-30 DIAGNOSIS — F17210 Nicotine dependence, cigarettes, uncomplicated: Secondary | ICD-10-CM | POA: Diagnosis not present

## 2019-06-30 DIAGNOSIS — S32010A Wedge compression fracture of first lumbar vertebra, initial encounter for closed fracture: Secondary | ICD-10-CM | POA: Insufficient documentation

## 2019-06-30 DIAGNOSIS — W108XXA Fall (on) (from) other stairs and steps, initial encounter: Secondary | ICD-10-CM | POA: Diagnosis not present

## 2019-06-30 DIAGNOSIS — S22080A Wedge compression fracture of T11-T12 vertebra, initial encounter for closed fracture: Secondary | ICD-10-CM | POA: Diagnosis not present

## 2019-06-30 DIAGNOSIS — M542 Cervicalgia: Secondary | ICD-10-CM | POA: Insufficient documentation

## 2019-06-30 DIAGNOSIS — S299XXA Unspecified injury of thorax, initial encounter: Secondary | ICD-10-CM | POA: Diagnosis present

## 2019-06-30 DIAGNOSIS — Y9301 Activity, walking, marching and hiking: Secondary | ICD-10-CM | POA: Diagnosis not present

## 2019-06-30 MED ORDER — LIDOCAINE 5 % EX PTCH: 1.0000 | MEDICATED_PATCH | CUTANEOUS | 0 refills | Status: DC

## 2019-06-30 MED ORDER — METHOCARBAMOL 500 MG PO TABS: 500.0000 mg | ORAL_TABLET | Freq: Three times a day (TID) | ORAL | 0 refills | Status: DC | PRN

## 2019-06-30 NOTE — ED Triage Notes (Signed)
Pt presents to ED after fall on Friday. Pt states she hit her head on the wall and has a knot on her head and c/o lower to mid back pain since.

## 2019-06-30 NOTE — Progress Notes (Signed)
Assessment & Plan:  1-2. Traumatic hematoma of head, initial encounter/Chronic midline low back pain with bilateral sciatica - Advised patient to go to ER due to worsening back pain that she rates 9/10 with Oxycodone, Meloxicam, and Gabapentin. Also concerned for large hematoma with her being off balanced. Discussed need for head CT and imaging of her back.   3. Need for immunization against influenza - Patient requested flu shot today before leaving office.  - Flu Vaccine QUAD 36+ mos IM   Follow up plan: Return if symptoms worsen or fail to improve.  Hendricks Limes, MSN, APRN, FNP-C [facility]  Subjective:   Patient ID: Elizabeth Brewer, female    DOB: 1960/10/30, 58 y.o.   MRN: [id number]  HPI: Elizabeth Brewer is a 58 y.o. female presenting on 06/30/2019 for Fall (Patient states she fell up the steps friday. Has a knot on her head but no pain. C/o back pain)  Patient reports she was coming up the stairs and fell on the last step. This occurred 4 days ago. She has been experiencing worsening of back pain and has a large lump on the left side of her head. Denies headaches, vision changes, and memory issues. She does report feeling off balance and has been losing balance more since she fell. The back pain is present in her lumbar spine. She describes the pain as aching, shooting, sharp, stabbing, and throbbing. She rates the pain 9/10 with medications. She has chronic back pain but reports this is much worse. She takes oxycodone 10 mg TID, meloxicam 15 mg daily, and Neurontin 600 mg TID. States nothing makes the pain better and it is worsened by sitting or bending over. She has been applying ice as well.     ROS: Negative unless specifically indicated above in HPI.   Relevant past medical history reviewed and updated as indicated.   Allergies and medications reviewed and updated.   Current Outpatient Medications:  .  citalopram (CELEXA) 40 MG tablet, Take 1  tablet (40 mg total) by mouth daily., Disp: 90 tablet, Rfl: 1 .  clonazePAM (KLONOPIN) 1 MG tablet, TAKE 1 TABLET DAILY AS NEEDED FOR ANXIETY, Disp: 30 tablet, Rfl: 5 .  fluticasone furoate-vilanterol (BREO ELLIPTA) 100-25 MCG/INH AEPB, Inhale 1 puff into the lungs daily., Disp: 1 each, Rfl: 6 .  folic acid (FOLVITE) 1 MG tablet, Take 3 tablets (3 mg total) by mouth daily., Disp: 90 tablet, Rfl: 3 .  gabapentin (NEURONTIN) 300 MG capsule, Take 600 mg by mouth 3 (three) times daily., Disp: , Rfl:  .  hydrochlorothiazide (MICROZIDE) 12.5 MG capsule, TAKE ONE (1) CAPSULE EACH DAY, Disp: 30 capsule, Rfl: 2 .  hydrOXYzine (VISTARIL) 25 MG capsule, Take 1 capsule (25 mg total) by mouth 3 (three) times daily as needed., Disp: 90 capsule, Rfl: 1 .  levothyroxine (SYNTHROID) 88 MCG tablet, TAKE ONE TABLET EACH MORNING BEFORE BREAKFAST, Disp: 90 tablet, Rfl: 2 .  lubiprostone (AMITIZA) 8 MCG capsule, Take 1 capsule (8 mcg total) by mouth 2 (two) times daily with a meal., Disp: 60 capsule, Rfl: 2 .  meloxicam (MOBIC) 15 MG tablet, , Disp: , Rfl:  .  Multiple Vitamins-Minerals (VITAMIN D3 COMPLETE PO), Take by mouth., Disp: , Rfl:  .  omeprazole (PRILOSEC) 20 MG capsule, Take 1 capsule (20 mg total) by mouth daily., Disp: 30 capsule, Rfl: 3 .  oxyCODONE (OXY IR/ROXICODONE) 5 MG immediate release tablet, Take 10 mg by mouth 3 (three) times daily. ,  Disp: , Rfl:  .  Potassium Chloride ER 20 MEQ TBCR, Take 20 mcg by mouth 2 (two) times a day., Disp: 60 tablet, Rfl: 3 .  rosuvastatin (CRESTOR) 20 MG tablet, TAKE 1 TABLET DAILY IN THE EVENING, Disp: 30 tablet, Rfl: 0 .  SUMAtriptan (IMITREX) 100 MG tablet, , Disp: , Rfl:  .  topiramate (TOPAMAX) 100 MG tablet, Take 1 tablet (100 mg total) by mouth 2 (two) times daily., Disp: 180 tablet, Rfl: 2  No Known Allergies  Objective:   BP 130/72   Pulse 67   Temp 97.7 F (36.5 C) (Temporal)   Ht 5\' 5"  (1.651 m)   Wt 186 lb 6.4 oz (84.6 kg)   SpO2 93%   BMI 31.02  kg/m    Physical Exam Vitals signs reviewed.  Constitutional:      General: She is not in acute distress.    Appearance: Normal appearance. She is obese. She is not ill-appearing, toxic-appearing or diaphoretic.  HENT:     Head: Normocephalic and atraumatic.     Comments: Palm size hematoma to left side of head.  Eyes:     General: No scleral icterus.       Right eye: No discharge.        Left eye: No discharge.     Extraocular Movements: Extraocular movements intact.     Conjunctiva/sclera: Conjunctivae normal.     Pupils: Pupils are equal, round, and reactive to light.  Neck:     Musculoskeletal: Normal range of motion.  Cardiovascular:     Rate and Rhythm: Normal rate.  Pulmonary:     Effort: Pulmonary effort is normal. No respiratory distress.  Musculoskeletal: Normal range of motion.  Skin:    General: Skin is warm and dry.     Capillary Refill: Capillary refill takes less than 2 seconds.  Neurological:     General: No focal deficit present.     Mental Status: She is alert and oriented to person, place, and time. Mental status is at baseline.     Cranial Nerves: Cranial nerves are intact.     Coordination: Coordination normal. Finger-Nose-Finger Test normal.  Psychiatric:        Mood and Affect: Mood normal.        Behavior: Behavior normal.        Thought Content: Thought content normal.        Judgment: Judgment normal.

## 2019-06-30 NOTE — Discharge Instructions (Addendum)
You were seen in the emergency department today after a fall.  The CT scan of your head and neck did not show any acute injuries.  The x-rays of your mid and lower back showed 2 possible areas of compression fractures, is difficult to determine if these are related to your fall but may have been.  We are sending you home with the following medicines:  -Lidoderm patch: Please apply 1 patch directly to your area of most significant pain once per day.  - Robaxin is the muscle relaxer I have prescribed, this is meant to help with muscle tightness. Be aware that this medication may make you drowsy therefore the first time you take this it should be at a time you are in an environment where you can rest. Do not drive or operate heavy machinery when taking this medication. Do not drink alcohol or take other sedating medications with this medicine such as narcotics or benzodiazepines.   We have prescribed you new medication(s) today. Discuss the medications prescribed today with your pharmacist as they can have adverse effects and interactions with your other medicines including over the counter and prescribed medications. Seek medical evaluation if you start to experience new or abnormal symptoms after taking one of these medicines, seek care immediately if you start to experience difficulty breathing, feeling of your throat closing, facial swelling, or rash as these could be indications of a more serious allergic reaction  Please follow-up with your primary care provider and/or neurosurgery for reevaluation of your discomfort within 3 to 5 days.  Return to the ER for new or worsening symptoms including but not limited to worsening pain, numbness, weakness, loss of control of bowel or bladder function, fever, or any other concerns.
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
## 2019-07-09 ENCOUNTER — Encounter: Payer: Self-pay | Admitting: Family

## 2019-07-09 ENCOUNTER — Ambulatory Visit (INDEPENDENT_AMBULATORY_CARE_PROVIDER_SITE_OTHER): Payer: Medicare Other | Admitting: Family

## 2019-07-09 DIAGNOSIS — S32000A Wedge compression fracture of unspecified lumbar vertebra, initial encounter for closed fracture: Secondary | ICD-10-CM

## 2019-07-09 DIAGNOSIS — Z09 Encounter for follow-up examination after completed treatment for conditions other than malignant neoplasm: Secondary | ICD-10-CM | POA: Diagnosis not present

## 2019-07-09 DIAGNOSIS — W19XXXD Unspecified fall, subsequent encounter: Secondary | ICD-10-CM | POA: Diagnosis not present

## 2019-07-09 DIAGNOSIS — M545 Low back pain: Secondary | ICD-10-CM | POA: Diagnosis not present

## 2019-07-09 DIAGNOSIS — G8929 Other chronic pain: Secondary | ICD-10-CM

## 2019-07-09 DIAGNOSIS — Y92009 Unspecified place in unspecified non-institutional (private) residence as the place of occurrence of the external cause: Secondary | ICD-10-CM

## 2019-07-09 MED ORDER — CYCLOBENZAPRINE HCL 10 MG PO TABS: 10.0000 mg | ORAL_TABLET | Freq: Three times a day (TID) | ORAL | 2 refills | Status: DC | PRN

## 2019-07-09 NOTE — Progress Notes (Signed)
Virtual Visit via telephone Note Due to COVID-19 pandemic this visit was conducted virtually. This visit type was conducted due to national recommendations for restrictions regarding the COVID-19 Pandemic (e.g. social distancing, sheltering in place) in an effort to limit this patient's exposure and mitigate transmission in our community. All issues noted in this document were discussed and addressed.  A physical exam was not performed with this format.  I connected with Elizabeth Brewer on 07/09/19 at 12:00 pm by telephone and verified that I am speaking with the correct person using two identifiers. Elizabeth Brewer is currently located at home and no one  is currently with her during visit. The provider, [name], FNP is located in their office at time of visit.  I discussed the limitations, risks, security and privacy concerns of performing an evaluation and management service by telephone and the availability of in person appointments. I also discussed with the patient that there may be a patient responsible charge related to this service. The patient expressed understanding and agreed to proceed.   History and Present Illness:  HPI  PT calls the office today for ED follow up. She went to the ED for 06/30/19 after falling down her front stairs. She had lumbar spine that showed " 1. Age-indeterminate wedge compression fracture of L1 with less than 25% height loss. MRI would be helpful for better temporal characterization. 2. Progression of degenerative disc disease compared to the prior Radiograph."  She had negative CT head and Ct of cervical spine that showed degenerative changes.    She is followed by Neurologists every 3 months for pain medication.   She is reporting constant, throbbing aching pain of 8 out 10. She has been taking her oxycodone with mild relief. She has continued her mobic, robaxin with no relief.   She has not seen a neurosurgereon before and wants to go to  Dr. Joaquim Nam.     Review of Systems  All other systems reviewed and are negative.    Observations/Objective: No SOB or distress noted  Assessment and Plan: 1. Chronic bilateral low back pain, unspecified whether sciatica present - Ambulatory referral to Neurosurgery - MR Lumbar Spine Wo Contrast; Future  2. Hospital discharge follow-up - Ambulatory referral to Neurosurgery - MR Lumbar Spine Wo Contrast; Future  3. Fall in home, subsequent encounter - Ambulatory referral to Neurosurgery - MR Lumbar Spine Wo Contrast; Future  4. Closed wedge fracture of lumbar vertebra, unspecified lumbar vertebral level, initial encounter [facility]) - Ambulatory referral to Neurosurgery - MR Lumbar Spine Wo Contrast; Future  Will order MRI given pain and x-ray.  I will also place referral to Neurosurgeon She will continue pain medications  Fall risks discussed We changed her baclofen to flexeril per her request    I discussed the assessment and treatment plan with the patient. The patient was provided an opportunity to ask questions and all were answered. The patient agreed with the plan and demonstrated an understanding of the instructions.   The patient was advised to call back or seek an in-person evaluation if the symptoms worsen or if the condition fails to improve as anticipated.  The above assessment and management plan was discussed with the patient. The patient verbalized understanding of and has agreed to the management plan. Patient is aware to call the clinic if symptoms persist or worsen. Patient is aware when to return to the clinic for a follow-up visit. Patient educated on when it is appropriate to go to  the emergency department.   Time call ended:  12:25 pm  I provided 25 minutes of non-face-to-face time during this encounter.    [name], FNP

## 2019-07-20 ENCOUNTER — Other Ambulatory Visit: Payer: Self-pay

## 2019-07-20 ENCOUNTER — Ambulatory Visit (HOSPITAL_COMMUNITY)
Admission: RE | Admit: 2019-07-20 | Discharge: 2019-07-20 | Disposition: A | Discharge status: Home / Self Care | Payer: Medicare Other | Source: Ambulatory Visit | Attending: Family | Admitting: Family

## 2019-07-20 DIAGNOSIS — M4855XA Collapsed vertebra, not elsewhere classified, thoracolumbar region, initial encounter for fracture: Secondary | ICD-10-CM | POA: Diagnosis not present

## 2019-07-20 DIAGNOSIS — Z09 Encounter for follow-up examination after completed treatment for conditions other than malignant neoplasm: Secondary | ICD-10-CM

## 2019-07-20 DIAGNOSIS — G8929 Other chronic pain: Secondary | ICD-10-CM

## 2019-07-20 DIAGNOSIS — M545 Low back pain, unspecified: Secondary | ICD-10-CM

## 2019-07-20 DIAGNOSIS — Y92009 Unspecified place in unspecified non-institutional (private) residence as the place of occurrence of the external cause: Secondary | ICD-10-CM | POA: Insufficient documentation

## 2019-07-20 DIAGNOSIS — S32000A Wedge compression fracture of unspecified lumbar vertebra, initial encounter for closed fracture: Secondary | ICD-10-CM

## 2019-07-20 DIAGNOSIS — W19XXXD Unspecified fall, subsequent encounter: Secondary | ICD-10-CM | POA: Insufficient documentation

## 2019-07-20 DIAGNOSIS — M47816 Spondylosis without myelopathy or radiculopathy, lumbar region: Secondary | ICD-10-CM | POA: Diagnosis not present

## 2019-08-11 ENCOUNTER — Other Ambulatory Visit: Payer: Self-pay | Admitting: Family

## 2019-08-20 DIAGNOSIS — G959 Disease of spinal cord, unspecified: Secondary | ICD-10-CM | POA: Insufficient documentation

## 2019-08-21 ENCOUNTER — Other Ambulatory Visit: Payer: Self-pay | Admitting: Neurological Surgery

## 2019-08-21 DIAGNOSIS — G959 Disease of spinal cord, unspecified: Secondary | ICD-10-CM

## 2019-09-01 ENCOUNTER — Other Ambulatory Visit: Payer: Self-pay

## 2019-09-01 ENCOUNTER — Ambulatory Visit (HOSPITAL_COMMUNITY)
Admission: RE | Admit: 2019-09-01 | Discharge: 2019-09-01 | Disposition: A | Discharge status: Home / Self Care | Payer: Medicare Other | Source: Ambulatory Visit | Attending: Neurological Surgery | Admitting: Neurological Surgery

## 2019-09-01 DIAGNOSIS — G959 Disease of spinal cord, unspecified: Secondary | ICD-10-CM | POA: Insufficient documentation

## 2019-09-05 ENCOUNTER — Other Ambulatory Visit: Payer: Self-pay | Admitting: Family

## 2019-10-06 ENCOUNTER — Other Ambulatory Visit: Payer: Self-pay | Admitting: *Deleted

## 2019-10-06 MED ORDER — OMEPRAZOLE 20 MG PO CPDR: DELAYED_RELEASE_CAPSULE | ORAL | 0 refills | Status: DC

## 2019-10-07 ENCOUNTER — Other Ambulatory Visit: Payer: Self-pay | Admitting: *Deleted

## 2019-10-07 DIAGNOSIS — I10 Essential (primary) hypertension: Secondary | ICD-10-CM

## 2019-10-07 MED ORDER — HYDROCHLOROTHIAZIDE 12.5 MG PO CAPS: ORAL_CAPSULE | ORAL | 0 refills | Status: DC

## 2019-12-07 ENCOUNTER — Other Ambulatory Visit: Payer: Self-pay | Admitting: Family

## 2019-12-08 ENCOUNTER — Other Ambulatory Visit: Payer: Self-pay | Admitting: Family

## 2019-12-08 DIAGNOSIS — I10 Essential (primary) hypertension: Secondary | ICD-10-CM

## 2019-12-08 NOTE — Telephone Encounter (Signed)
Appointment made

## 2019-12-08 NOTE — Telephone Encounter (Signed)
Hawks. NTBS LOV for Dx 03/27/19 mail order not sent

## 2019-12-15 ENCOUNTER — Encounter: Payer: Self-pay | Admitting: Family

## 2019-12-15 ENCOUNTER — Ambulatory Visit (INDEPENDENT_AMBULATORY_CARE_PROVIDER_SITE_OTHER): Payer: Medicare Other | Admitting: Family

## 2019-12-15 DIAGNOSIS — E039 Hypothyroidism, unspecified: Secondary | ICD-10-CM | POA: Diagnosis not present

## 2019-12-15 DIAGNOSIS — K219 Gastro-esophageal reflux disease without esophagitis: Secondary | ICD-10-CM

## 2019-12-15 DIAGNOSIS — K5900 Constipation, unspecified: Secondary | ICD-10-CM

## 2019-12-15 DIAGNOSIS — J449 Chronic obstructive pulmonary disease, unspecified: Secondary | ICD-10-CM

## 2019-12-15 DIAGNOSIS — I10 Essential (primary) hypertension: Secondary | ICD-10-CM | POA: Diagnosis not present

## 2019-12-15 DIAGNOSIS — Z79899 Other long term (current) drug therapy: Secondary | ICD-10-CM

## 2019-12-15 DIAGNOSIS — F1320 Sedative, hypnotic or anxiolytic dependence, uncomplicated: Secondary | ICD-10-CM

## 2019-12-15 DIAGNOSIS — G43109 Migraine with aura, not intractable, without status migrainosus: Secondary | ICD-10-CM

## 2019-12-15 DIAGNOSIS — F331 Major depressive disorder, recurrent, moderate: Secondary | ICD-10-CM

## 2019-12-15 DIAGNOSIS — E559 Vitamin D deficiency, unspecified: Secondary | ICD-10-CM

## 2019-12-15 DIAGNOSIS — E785 Hyperlipidemia, unspecified: Secondary | ICD-10-CM

## 2019-12-15 DIAGNOSIS — F17200 Nicotine dependence, unspecified, uncomplicated: Secondary | ICD-10-CM

## 2019-12-15 DIAGNOSIS — F419 Anxiety disorder, unspecified: Secondary | ICD-10-CM

## 2019-12-15 MED ORDER — LIDOCAINE 5 % EX PTCH: 1.0000 | MEDICATED_PATCH | CUTANEOUS | 0 refills | Status: DC

## 2019-12-15 MED ORDER — LUBIPROSTONE 8 MCG PO CAPS: 8.0000 ug | ORAL_CAPSULE | Freq: Two times a day (BID) | ORAL | 2 refills | Status: DC

## 2019-12-15 MED ORDER — MELOXICAM 15 MG PO TABS: 15.0000 mg | ORAL_TABLET | Freq: Every day | ORAL | 3 refills | Status: DC

## 2019-12-15 MED ORDER — CLONAZEPAM 1 MG PO TABS: ORAL_TABLET | ORAL | 5 refills | Status: DC

## 2019-12-15 NOTE — Progress Notes (Signed)
Virtual Visit via telephone Note Due to COVID-19 pandemic this visit was conducted virtually. This visit type was conducted due to national recommendations for restrictions regarding the COVID-19 Pandemic (e.g. social distancing, sheltering in place) in an effort to limit this patient's exposure and mitigate transmission in our community. All issues noted in this document were discussed and addressed.  A physical exam was not performed with this format.  I connected with Elizabeth Brewer on 12/15/19 at 9:00 by telephone and verified that I am speaking with the correct person using two identifiers. Elizabeth Brewer is currently located at home and no one is currently with her during visit. The provider, [name], FNP is located in their office at time of visit.  I discussed the limitations, risks, security and privacy concerns of performing an evaluation and management service by telephone and the availability of in person appointments. I also discussed with the patient that there may be a patient responsible charge related to this service. The patient expressed understanding and agreed to proceed.   History and Present Illness:  PT calls the office todayfor chronic follow up. PT is followed by a neurologists every38month for chronic neck pain,seizure, and migraines. Hypertension This is a new problem. The current episode started more than 1 year ago. The problem has been resolved since onset. The problem is controlled. Associated symptoms include anxiety, malaise/fatigue and peripheral edema. Pertinent negatives include no shortness of breath. Risk factors for coronary artery disease include dyslipidemia, obesity, sedentary lifestyle and smoking/tobacco exposure. The current treatment provides moderate improvement. There is no history of CVA or heart failure. Identifiable causes of hypertension include a thyroid problem.  Gastroesophageal Reflux She complains of belching and heartburn. This  is a chronic problem. The current episode started more than 1 year ago. The problem occurs occasionally. The symptoms are aggravated by smoking. Associated symptoms include fatigue. Risk factors include obesity. She has tried a PPI for the symptoms. The treatment provided moderate relief.  Thyroid Problem Presents for follow-up visit. Symptoms include anxiety, constipation, depressed mood and fatigue. The symptoms have been stable. Her past medical history is significant for hyperlipidemia. There is no history of heart failure.  Depression        This is a chronic problem.  The current episode started more than 1 year ago.   The onset quality is gradual.   The problem occurs intermittently.  Associated symptoms include decreased concentration, fatigue, helplessness, irritable, restlessness, decreased interest and sad.     The symptoms are aggravated by family issues.  Past treatments include SSRIs - Selective serotonin reuptake inhibitors.  Previous treatment provided moderate relief.  Past medical history includes hypothyroidism, thyroid problem and anxiety.   Hyperlipidemia This is a chronic problem. The current episode started more than 1 year ago. The problem is uncontrolled. Exacerbating diseases include hypothyroidism. Pertinent negatives include no shortness of breath. Current antihyperlipidemic treatment includes diet change. The current treatment provides moderate improvement of lipids. Risk factors for coronary artery disease include dyslipidemia and hypertension.  Anxiety Presents for follow-up visit. Symptoms include decreased concentration, depressed mood, excessive worry, irritability, nervous/anxious behavior, panic and restlessness. Patient reports no shortness of breath. Symptoms occur most days. The severity of symptoms is moderate.    Constipation This is a chronic problem. The current episode started more than 1 year ago. The problem has been resolved since onset. Her stool frequency  is 1 time per week or less. She has tried laxatives for the symptoms. The treatment  provided moderate relief.  COPD PT continues to smoke a pack a day. Uses the [place] daily.     Review of Systems  Constitutional: Positive for fatigue, irritability and malaise/fatigue.  Respiratory: Negative for shortness of breath.   Gastrointestinal: Positive for constipation and heartburn.  Psychiatric/Behavioral: Positive for decreased concentration and depression. The patient is nervous/anxious.   All other systems reviewed and are negative.    Observations/Objective: No SOB or distress noted   Assessment and Plan: Elizabeth Brewer comes in today with chief complaint of No chief complaint on file.   Diagnosis and orders addressed:  1. Essential hypertension - CMP14+EGFR; Future - CBC with Differential/Platelet; Future  2. Migraine with aura and without status migrainosus, not intractable - CMP14+EGFR; Future - CBC with Differential/Platelet; Future  3. Chronic obstructive pulmonary disease, unspecified COPD type ([place]) - CMP14+EGFR; Future - CBC with Differential/Platelet; Future  4. Gastroesophageal reflux disease, unspecified whether esophagitis present - CMP14+EGFR; Future - CBC with Differential/Platelet; Future  5. Hypothyroidism, unspecified type - CMP14+EGFR; Future - CBC with Differential/Platelet; Future - TSH; Future  6. Anxiety - CMP14+EGFR; Future - CBC with Differential/Platelet; Future - clonazePAM (KLONOPIN) 1 MG tablet; TAKE 1 TABLET DAILY AS NEEDED FOR ANXIETY  Dispense: 30 tablet; Refill: 5  7. Benzodiazepine dependence (HCC) - CMP14+EGFR; Future - CBC with Differential/Platelet; Future - ToxASSURE Select 13 (MW), Urine; Future - clonazePAM (KLONOPIN) 1 MG tablet; TAKE 1 TABLET DAILY AS NEEDED FOR ANXIETY  Dispense: 30 tablet; Refill: 5  8. Constipation, unspecified constipation type - CMP14+EGFR; Future - CBC with Differential/Platelet; Future -  lubiprostone (AMITIZA) 8 MCG capsule; Take 1 capsule (8 mcg total) by mouth 2 (two) times daily with a meal.  Dispense: 60 capsule; Refill: 2  9. Controlled substance agreement signed - CMP14+EGFR; Future - CBC with Differential/Platelet; Future - TSH; Future - ToxASSURE Select 13 (MW), Urine; Future - clonazePAM (KLONOPIN) 1 MG tablet; TAKE 1 TABLET DAILY AS NEEDED FOR ANXIETY  Dispense: 30 tablet; Refill: 5  10. Moderate episode of recurrent major depressive disorder (HCC) - CMP14+EGFR; Future - CBC with Differential/Platelet; Future  11. TOBACCO ABUSE - CMP14+EGFR; Future - CBC with Differential/Platelet; Future  12. Hyperlipidemia, unspecified hyperlipidemia type - CMP14+EGFR; Future - CBC with Differential/Platelet; Future - Lipid panel; Future  13. Vitamin D deficiency - CMP14+EGFR; Future - CBC with Differential/Platelet; Future - VITAMIN D 25 Hydroxy (Vit-D Deficiency, Fractures); Future   Labs pending Health Maintenance reviewed Diet and exercise encouraged  Follow up plan: 3 months     I discussed the assessment and treatment plan with the patient. The patient was provided an opportunity to ask questions and all were answered. The patient agreed with the plan and demonstrated an understanding of the instructions.   The patient was advised to call back or seek an in-person evaluation if the symptoms worsen or if the condition fails to improve as anticipated.  The above assessment and management plan was discussed with the patient. The patient verbalized understanding of and has agreed to the management plan. Patient is aware to call the clinic if symptoms persist or worsen. Patient is aware when to return to the clinic for a follow-up visit. Patient educated on when it is appropriate to go to the emergency department.   Time call ended:  9:24 AM  I provided 24 minutes of non-face-to-face time during this encounter.    [name], FNP

## 2019-12-17 DIAGNOSIS — G43109 Migraine with aura, not intractable, without status migrainosus: Secondary | ICD-10-CM | POA: Diagnosis not present

## 2019-12-17 DIAGNOSIS — M25562 Pain in left knee: Secondary | ICD-10-CM | POA: Diagnosis not present

## 2019-12-17 DIAGNOSIS — M25561 Pain in right knee: Secondary | ICD-10-CM | POA: Diagnosis not present

## 2019-12-17 DIAGNOSIS — M545 Low back pain: Secondary | ICD-10-CM | POA: Diagnosis not present

## 2019-12-17 DIAGNOSIS — E538 Deficiency of other specified B group vitamins: Secondary | ICD-10-CM | POA: Diagnosis not present

## 2019-12-18 ENCOUNTER — Ambulatory Visit (INDEPENDENT_AMBULATORY_CARE_PROVIDER_SITE_OTHER): Payer: Medicare Other

## 2019-12-18 DIAGNOSIS — Z0000 Encounter for general adult medical examination without abnormal findings: Secondary | ICD-10-CM | POA: Diagnosis not present
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
## 2019-12-18 NOTE — Patient Instructions (Signed)
  [facility] Maintenance Summary and Written Plan of Care  Ms. Lichtenwalner ,  Thank you for allowing me to perform your Medicare Annual Wellness Visit and for your ongoing commitment to your health.   Health Maintenance & Immunization History Health Maintenance  Topic Date Due  . DEXA SCAN  08/06/2018  . INFLUENZA VACCINE  04/03/2020  . MAMMOGRAM  05/21/2021  . COLONOSCOPY  01/05/2024  . TETANUS/TDAP  03/09/2025  . Hepatitis C Screening  Completed  . HIV Screening  Completed  . PAP SMEAR-Modifier  Discontinued   Immunization History  Administered Date(s) Administered  . Influenza,inj,Quad PF,6+ Mos 06/16/2018, 06/30/2019  . Influenza,trivalent, recombinat, inj, PF 06/07/2017  . Pneumococcal Polysaccharide-23 03/13/2018  . Tdap 03/10/2015    These are the patient goals that we discussed: Goals Addressed            This Visit's Progress   . Exercise 150 min/wk Moderate Activity      . Have 3 meals a day          This is a list of Health Maintenance Items that are overdue or due now: Health Maintenance Due  Topic Date Due  . DEXA SCAN  08/06/2018     Orders/Referrals Placed Today: No orders of the defined types were placed in this encounter.  (Contact our referral department at [phone] if you have not spoken with someone about your referral appointment within the next 5 days)    Follow-up Plan  [name], FNP on 03/15/2020 at 10:25 am

## 2020-01-05 ENCOUNTER — Other Ambulatory Visit: Payer: Self-pay | Admitting: Family

## 2020-01-05 DIAGNOSIS — F1320 Sedative, hypnotic or anxiolytic dependence, uncomplicated: Secondary | ICD-10-CM

## 2020-01-05 DIAGNOSIS — F419 Anxiety disorder, unspecified: Secondary | ICD-10-CM

## 2020-01-15 ENCOUNTER — Other Ambulatory Visit: Payer: Self-pay | Admitting: Family

## 2020-01-15 DIAGNOSIS — I10 Essential (primary) hypertension: Secondary | ICD-10-CM

## 2020-01-19 ENCOUNTER — Other Ambulatory Visit: Payer: Self-pay | Admitting: Family

## 2020-01-19 DIAGNOSIS — I10 Essential (primary) hypertension: Secondary | ICD-10-CM

## 2020-01-27 ENCOUNTER — Other Ambulatory Visit: Payer: Self-pay | Admitting: *Deleted

## 2020-01-27 ENCOUNTER — Encounter: Payer: Self-pay | Admitting: *Deleted

## 2020-01-27 MED ORDER — LEVOTHYROXINE SODIUM 88 MCG PO TABS: ORAL_TABLET | ORAL | 0 refills | Status: DC

## 2020-01-28 ENCOUNTER — Other Ambulatory Visit: Payer: Self-pay

## 2020-01-28 ENCOUNTER — Encounter: Payer: Self-pay | Admitting: Nurse Practitioner

## 2020-01-28 ENCOUNTER — Ambulatory Visit (INDEPENDENT_AMBULATORY_CARE_PROVIDER_SITE_OTHER): Payer: Medicare Other | Admitting: Nurse Practitioner

## 2020-01-28 VITALS — BP 125/70 | HR 73 | Temp 97.2°F | Ht 65.0 in | Wt 196.6 lb

## 2020-01-28 DIAGNOSIS — G43109 Migraine with aura, not intractable, without status migrainosus: Secondary | ICD-10-CM

## 2020-01-28 DIAGNOSIS — J449 Chronic obstructive pulmonary disease, unspecified: Secondary | ICD-10-CM | POA: Diagnosis not present

## 2020-01-28 DIAGNOSIS — Z79899 Other long term (current) drug therapy: Secondary | ICD-10-CM | POA: Diagnosis not present

## 2020-01-28 DIAGNOSIS — E559 Vitamin D deficiency, unspecified: Secondary | ICD-10-CM | POA: Diagnosis not present

## 2020-01-28 DIAGNOSIS — R600 Localized edema: Secondary | ICD-10-CM

## 2020-01-28 DIAGNOSIS — K5900 Constipation, unspecified: Secondary | ICD-10-CM | POA: Diagnosis not present

## 2020-01-28 DIAGNOSIS — I10 Essential (primary) hypertension: Secondary | ICD-10-CM | POA: Diagnosis not present

## 2020-01-28 DIAGNOSIS — K219 Gastro-esophageal reflux disease without esophagitis: Secondary | ICD-10-CM | POA: Diagnosis not present

## 2020-01-28 DIAGNOSIS — E785 Hyperlipidemia, unspecified: Secondary | ICD-10-CM

## 2020-01-28 DIAGNOSIS — F17200 Nicotine dependence, unspecified, uncomplicated: Secondary | ICD-10-CM

## 2020-01-28 DIAGNOSIS — F419 Anxiety disorder, unspecified: Secondary | ICD-10-CM

## 2020-01-28 DIAGNOSIS — F1320 Sedative, hypnotic or anxiolytic dependence, uncomplicated: Secondary | ICD-10-CM

## 2020-01-28 DIAGNOSIS — F331 Major depressive disorder, recurrent, moderate: Secondary | ICD-10-CM

## 2020-01-28 DIAGNOSIS — E039 Hypothyroidism, unspecified: Secondary | ICD-10-CM

## 2020-01-28 MED ORDER — CYCLOBENZAPRINE HCL 10 MG PO TABS: 10.0000 mg | ORAL_TABLET | Freq: Three times a day (TID) | ORAL | 0 refills | Status: DC | PRN

## 2020-01-28 MED ORDER — LUBIPROSTONE 8 MCG PO CAPS: 8.0000 ug | ORAL_CAPSULE | Freq: Two times a day (BID) | ORAL | 1 refills | Status: DC

## 2020-01-28 MED ORDER — MELOXICAM 15 MG PO TABS: 15.0000 mg | ORAL_TABLET | Freq: Every day | ORAL | 0 refills | Status: DC

## 2020-01-28 MED ORDER — FUROSEMIDE 20 MG PO TABS: 20.0000 mg | ORAL_TABLET | Freq: Every day | ORAL | 0 refills | Status: DC

## 2020-01-28 NOTE — Patient Instructions (Addendum)
Localized edema Bilateral edema not well controlled, this is a new problem for patient, provided education to patient to elevate legs above heart, wear compression socks. Started patient on 20 mg lasix, printed education handout given to patient. patient knows to follow up with unresolved or worsening symptoms.   Extensive education provided to patient on smoking cessation.    Smoking Tobacco Information, Adult Smoking tobacco can be harmful to your health. Tobacco contains a poisonous (toxic), colorless chemical called nicotine. Nicotine is addictive. It changes the brain and can make it hard to stop smoking. Tobacco also has other toxic chemicals that can hurt your body and raise your risk of many cancers. How can smoking tobacco affect me? Smoking tobacco puts you at risk for:  Cancer. Smoking is most commonly associated with lung cancer, but can also lead to cancer in other parts of the body.  Chronic obstructive pulmonary disease (COPD). This is a long-term lung condition that makes it hard to breathe. It also gets worse over time.  High blood pressure (hypertension), heart disease, stroke, or heart attack.  Lung infections, such as pneumonia.  Cataracts. This is when the lenses in the eyes become clouded.  Digestive problems. This may include peptic ulcers, heartburn, and gastroesophageal reflux disease (GERD).  Oral health problems, such as gum disease and tooth loss.  Loss of taste and smell. Smoking can affect your appearance by causing:  Wrinkles.  Yellow or stained teeth, fingers, and fingernails. Smoking tobacco can also affect your social life, because:  It may be challenging to find places to smoke when away from home. Many workplaces, [employer], hotels, and public places are tobacco-free.  Smoking is expensive. This is due to the cost of tobacco and the long-term costs of treating health problems from smoking.  Secondhand smoke may affect those around you.  Secondhand smoke can cause lung cancer, breathing problems, and heart disease. Children of smokers have a higher risk for: ? Sudden infant death syndrome (SIDS). ? Ear infections. ? Lung infections. If you currently smoke tobacco, quitting now can help you:  Lead a longer and healthier life.  Look, smell, breathe, and feel better over time.  Save money.  Protect others from the harms of secondhand smoke. What actions can I take to prevent health problems? Quit smoking   Do not start smoking. Quit if you already do.  Make a plan to quit smoking and commit to it. Look for programs to help you and ask your health care provider for recommendations and ideas.  Set a date and write down all the reasons you want to quit.  Let your friends and family know you are quitting so they can help and support you. Consider finding friends who also want to quit. It can be easier to quit with someone else, so that you can support each other.  Talk with your health care provider about using nicotine replacement medicines to help you quit, such as gum, lozenges, patches, sprays, or pills.  Do not replace cigarette smoking with electronic cigarettes, which are commonly called e-cigarettes. The safety of e-cigarettes is not known, and some may contain harmful chemicals.  If you try to quit but return to smoking, stay positive. It is common to slip up when you first quit, so take it one day at a time.  Be prepared for cravings. When you feel the urge to smoke, chew gum or suck on hard candy. Lifestyle  Stay busy and take care of your body.  Drink enough  fluid to keep your urine pale yellow.  Get plenty of exercise and eat a healthy diet. This can help prevent weight gain after quitting.  Monitor your eating habits. Quitting smoking can cause you to have a larger appetite than when you smoke.  Find ways to relax. Go out with friends or family to a movie or a restaurant where people do not  smoke.  Ask your health care provider about having regular tests (screenings) to check for cancer. This may include blood tests, imaging tests, and other tests.  Find ways to manage your stress, such as meditation, yoga, or exercise. Where to find support To get support to quit smoking, consider:  Asking your health care provider for more information and resources.  Taking classes to learn more about quitting smoking.  Looking for local organizations that offer resources about quitting smoking.  Joining a support group for people who want to quit smoking in your local community.  Calling the smokefree.gov counselor helpline: 1-800-Quit-Now (831)671-5379) Where to find more information You may find more information about quitting smoking from:  HelpGuide.org: www.helpguide.org  https://hall.com/: smokefree.gov  American Lung Association: www.lung.org Contact a health care provider if you:  Have problems breathing.  Notice that your lips, nose, or fingers turn blue.  Have chest pain.  Are coughing up blood.  Feel faint or you pass out.  Have other health changes that cause you to worry. Summary  Smoking tobacco can negatively affect your health, the health of those around you, your finances, and your social life.  Do not start smoking. Quit if you already do. If you need help quitting, ask your health care provider.  Think about joining a support group for people who want to quit smoking in your local community. There are many effective programs that will help you to quit this behavior. This information is not intended to replace advice given to you by your health care provider. Make sure you discuss any questions you have with your health care provider. Document Revised: 05/15/2019 Document Reviewed: 09/04/2016 Elsevier Patient Education  [place]. Edema  Edema is when you have too much fluid in your body or under your skin. Edema may make your legs, feet, and  ankles swell up. Swelling is also common in looser tissues, like around your eyes. This is a common condition. It gets more common as you get older. There are many possible causes of edema. Eating too much salt (sodium) and being on your feet or sitting for a long time can cause edema in your legs, feet, and ankles. Hot weather may make edema worse. Edema is usually painless. Your skin may look swollen or shiny. Follow these instructions at home:  Keep the swollen body part raised (elevated) above the level of your heart when you are sitting or lying down.  Do not sit still or stand for a long time.  Do not wear tight clothes. Do not wear garters on your upper legs.  Exercise your legs. This can help the swelling go down.  Wear elastic bandages or support stockings as told by your doctor.  Eat a low-salt (low-sodium) diet to reduce fluid as told by your doctor.  Depending on the cause of your swelling, you may need to limit how much fluid you drink (fluid restriction).  Take over-the-counter and prescription medicines only as told by your doctor. Contact a doctor if:  Treatment is not working.  You have heart, liver, or kidney disease and have symptoms of edema.  You have sudden and unexplained weight gain. Get help right away if:  You have shortness of breath or chest pain.  You cannot breathe when you lie down.  You have pain, redness, or warmth in the swollen areas.  You have heart, liver, or kidney disease and get edema all of a sudden.  You have a fever and your symptoms get worse all of a sudden. Summary  Edema is when you have too much fluid in your body or under your skin.  Edema may make your legs, feet, and ankles swell up. Swelling is also common in looser tissues, like around your eyes.  Raise (elevate) the swollen body part above the level of your heart when you are sitting or lying down.  Follow your doctor's instructions about diet and how much fluid you  can drink (fluid restriction). This information is not intended to replace advice given to you by your health care provider. Make sure you discuss any questions you have with your health care provider. Document Revised: 08/23/2017 Document Reviewed: 09/07/2016 Elsevier Patient Education  2020 [employer].

## 2020-01-28 NOTE — Assessment & Plan Note (Signed)
Bilateral edema not well controlled, this is a new problem for patient, provided education to patient to elevate legs above heart, wear compression socks. Started patient on 20 mg lasix, printed education handout given to patient. patient knows to follow up with unresolved or worsening symptoms.   Extensive education provided to patient on smoking cessation.
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
## 2020-01-29 ENCOUNTER — Other Ambulatory Visit: Payer: Self-pay

## 2020-01-29 ENCOUNTER — Other Ambulatory Visit: Payer: Self-pay | Admitting: Family

## 2020-01-29 LAB — CBC WITH DIFFERENTIAL/PLATELET
Basophils Absolute: 0 10*3/uL (ref 0.0–0.2)
Basos: 0 %
EOS (ABSOLUTE): 0.4 10*3/uL (ref 0.0–0.4)
Eos: 4 %
Hematocrit: 37.4 % (ref 34.0–46.6)
Hemoglobin: 12.8 g/dL (ref 11.1–15.9)
Immature Grans (Abs): 0 10*3/uL (ref 0.0–0.1)
Immature Granulocytes: 0 %
Lymphocytes Absolute: 2.3 10*3/uL (ref 0.7–3.1)
Lymphs: 24 %
MCH: 33.2 pg — ABNORMAL HIGH (ref 26.6–33.0)
MCHC: 34.2 g/dL (ref 31.5–35.7)
MCV: 97 fL (ref 79–97)
Monocytes Absolute: 0.5 10*3/uL (ref 0.1–0.9)
Monocytes: 5 %
Neutrophils Absolute: 6.2 10*3/uL (ref 1.4–7.0)
Neutrophils: 67 %
Platelets: 246 10*3/uL (ref 150–450)
RBC: 3.85 x10E6/uL (ref 3.77–5.28)
RDW: 13.1 % (ref 11.7–15.4)
WBC: 9.4 10*3/uL (ref 3.4–10.8)

## 2020-01-29 LAB — CMP14+EGFR
ALT: 12 IU/L (ref 0–32)
AST: 17 IU/L (ref 0–40)
Albumin/Globulin Ratio: 1.9 (ref 1.2–2.2)
Albumin: 4.2 g/dL (ref 3.8–4.9)
Alkaline Phosphatase: 80 IU/L (ref 48–121)
BUN/Creatinine Ratio: 8 — ABNORMAL LOW (ref 9–23)
BUN: 6 mg/dL (ref 6–24)
Bilirubin Total: 0.4 mg/dL (ref 0.0–1.2)
CO2: 25 mmol/L (ref 20–29)
Calcium: 8.8 mg/dL (ref 8.7–10.2)
Chloride: 100 mmol/L (ref 96–106)
Creatinine, Ser: 0.75 mg/dL (ref 0.57–1.00)
GFR calc Af Amer: 102 mL/min/{1.73_m2} (ref >59)
GFR calc non Af Amer: 88 mL/min/{1.73_m2} (ref >59)
Globulin, Total: 2.2 g/dL (ref 1.5–4.5)
Glucose: 92 mg/dL (ref 65–99)
Potassium: 3.1 mmol/L — ABNORMAL LOW (ref 3.5–5.2)
Sodium: 140 mmol/L (ref 134–144)
Total Protein: 6.4 g/dL (ref 6.0–8.5)

## 2020-01-29 LAB — TSH: TSH: 32.3 u[IU]/mL — ABNORMAL HIGH (ref 0.450–4.500)

## 2020-01-29 LAB — LIPID PANEL
Chol/HDL Ratio: 2.5 ratio (ref 0.0–4.4)
Cholesterol, Total: 143 mg/dL (ref 100–199)
HDL: 58 mg/dL (ref >39)
LDL Chol Calc (NIH): 67 mg/dL (ref 0–99)
Triglycerides: 98 mg/dL (ref 0–149)
VLDL Cholesterol Cal: 18 mg/dL (ref 5–40)

## 2020-01-29 LAB — VITAMIN D 25 HYDROXY (VIT D DEFICIENCY, FRACTURES): Vit D, 25-Hydroxy: 39.3 ng/mL (ref 30.0–100.0)

## 2020-01-29 MED ORDER — POTASSIUM CHLORIDE CRYS ER 20 MEQ PO TBCR: 20.0000 meq | EXTENDED_RELEASE_TABLET | Freq: Every day | ORAL | 3 refills | Status: DC

## 2020-01-29 MED ORDER — LEVOTHYROXINE SODIUM 100 MCG PO TABS: 100.0000 ug | ORAL_TABLET | Freq: Every day | ORAL | 11 refills | Status: DC

## 2020-01-29 MED ORDER — POTASSIUM CHLORIDE ER 20 MEQ PO TBCR: 20.0000 ug | EXTENDED_RELEASE_TABLET | Freq: Every day | ORAL | 2 refills | Status: DC

## 2020-02-01 LAB — TOXASSURE SELECT 13 (MW), URINE

## 2020-02-02 ENCOUNTER — Telehealth: Payer: Self-pay | Admitting: Family

## 2020-02-03 MED ORDER — CEPHALEXIN 500 MG PO CAPS: 500.0000 mg | ORAL_CAPSULE | Freq: Three times a day (TID) | ORAL | 0 refills | Status: DC

## 2020-02-03 NOTE — Telephone Encounter (Signed)
Keflex Prescription sent to pharmacy

## 2020-02-03 NOTE — Telephone Encounter (Signed)
Patient aware, script is ready. 

## 2020-03-15 ENCOUNTER — Ambulatory Visit (INDEPENDENT_AMBULATORY_CARE_PROVIDER_SITE_OTHER): Payer: Medicare Other | Admitting: Family

## 2020-03-15 ENCOUNTER — Encounter: Payer: Self-pay | Admitting: Family

## 2020-03-15 ENCOUNTER — Other Ambulatory Visit: Payer: Self-pay

## 2020-03-15 VITALS — BP 124/74 | HR 80 | Temp 98.6°F | Ht 65.0 in | Wt 198.0 lb

## 2020-03-15 DIAGNOSIS — E039 Hypothyroidism, unspecified: Secondary | ICD-10-CM | POA: Diagnosis not present

## 2020-03-15 DIAGNOSIS — J449 Chronic obstructive pulmonary disease, unspecified: Secondary | ICD-10-CM

## 2020-03-15 DIAGNOSIS — I10 Essential (primary) hypertension: Secondary | ICD-10-CM | POA: Diagnosis not present

## 2020-03-15 DIAGNOSIS — F1320 Sedative, hypnotic or anxiolytic dependence, uncomplicated: Secondary | ICD-10-CM

## 2020-03-15 DIAGNOSIS — E785 Hyperlipidemia, unspecified: Secondary | ICD-10-CM

## 2020-03-15 DIAGNOSIS — Z79899 Other long term (current) drug therapy: Secondary | ICD-10-CM

## 2020-03-15 DIAGNOSIS — F331 Major depressive disorder, recurrent, moderate: Secondary | ICD-10-CM

## 2020-03-15 DIAGNOSIS — K5900 Constipation, unspecified: Secondary | ICD-10-CM

## 2020-03-15 DIAGNOSIS — Z780 Asymptomatic menopausal state: Secondary | ICD-10-CM

## 2020-03-15 DIAGNOSIS — E8881 Metabolic syndrome: Secondary | ICD-10-CM

## 2020-03-15 DIAGNOSIS — K219 Gastro-esophageal reflux disease without esophagitis: Secondary | ICD-10-CM

## 2020-03-15 DIAGNOSIS — E663 Overweight: Secondary | ICD-10-CM

## 2020-03-15 DIAGNOSIS — F17200 Nicotine dependence, unspecified, uncomplicated: Secondary | ICD-10-CM

## 2020-03-15 DIAGNOSIS — F419 Anxiety disorder, unspecified: Secondary | ICD-10-CM

## 2020-03-15 LAB — BMP8+EGFR
BUN/Creatinine Ratio: 10 (ref 9–23)
BUN: 7 mg/dL (ref 6–24)
CO2: 26 mmol/L (ref 20–29)
Calcium: 8.9 mg/dL (ref 8.7–10.2)
Chloride: 100 mmol/L (ref 96–106)
Creatinine, Ser: 0.7 mg/dL (ref 0.57–1.00)
GFR calc Af Amer: 110 mL/min/{1.73_m2} (ref >59)
GFR calc non Af Amer: 96 mL/min/{1.73_m2} (ref >59)
Glucose: 126 mg/dL — ABNORMAL HIGH (ref 65–99)
Potassium: 3.1 mmol/L — ABNORMAL LOW (ref 3.5–5.2)
Sodium: 140 mmol/L (ref 134–144)

## 2020-03-15 MED ORDER — OZEMPIC (0.25 OR 0.5 MG/DOSE) 2 MG/1.5ML ~~LOC~~ SOPN: PEN_INJECTOR | SUBCUTANEOUS | 1 refills | Status: AC

## 2020-03-15 MED ORDER — CLONAZEPAM 1 MG PO TABS: ORAL_TABLET | ORAL | 5 refills | Status: DC

## 2020-03-15 NOTE — Progress Notes (Signed)
Subjective:    Patient ID: Elizabeth Brewer, female    DOB: 1960/12/01, 59 y.o.   MRN: [id number]  Chief Complaint  Patient presents with  . Medical Management of Chronic Issues  . Hypertension  . Anxiety  . Obesity   PT calls the office todayfor chronic follow up. PT is followed by a neurologists every14month for chronic neck pain,seizure, and migraines. Hypertension This is a chronic problem. The current episode started more than 1 year ago. The problem has been resolved since onset. The problem is controlled. Associated symptoms include anxiety and malaise/fatigue. Pertinent negatives include no peripheral edema or shortness of breath. Risk factors for coronary artery disease include dyslipidemia, obesity and sedentary lifestyle. The current treatment provides moderate improvement. There is no history of kidney disease, CVA or heart failure. Identifiable causes of hypertension include a thyroid problem.  Anxiety Presents for follow-up visit. Symptoms include depressed mood, irritability, nervous/anxious behavior and restlessness. Patient reports no shortness of breath. Symptoms occur occasionally. The severity of symptoms is moderate.    Depression        This is a chronic problem.  The current episode started more than 1 year ago.   The onset quality is gradual.   The problem occurs intermittently.  The problem has been waxing and waning since onset.  Associated symptoms include irritable, restlessness and sad.  Associated symptoms include no helplessness and no hopelessness.  Past treatments include SSRIs - Selective serotonin reuptake inhibitors.  Past medical history includes thyroid problem and anxiety.   Thyroid Problem Presents for follow-up visit. Symptoms include anxiety, constipation and depressed mood. The symptoms have been stable. Her past medical history is significant for hyperlipidemia. There is no history of heart failure.  Constipation This is a chronic problem. The  current episode started more than 1 year ago. The problem has been resolved since onset. Her stool frequency is 1 time per week or less.  Gastroesophageal Reflux She complains of belching and heartburn. This is a chronic problem. The current episode started more than 1 year ago. The problem occurs occasionally. The problem has been waxing and waning. The symptoms are aggravated by certain foods. Risk factors include obesity. She has tried a PPI for the symptoms. The treatment provided moderate relief.  Hyperlipidemia This is a chronic problem. The current episode started more than 1 year ago. Pertinent negatives include no shortness of breath. Current antihyperlipidemic treatment includes statins. The current treatment provides moderate improvement of lipids. Risk factors for coronary artery disease include dyslipidemia, hypertension, a sedentary lifestyle and post-menopausal.  Nicotine Dependence Presents for follow-up visit. Symptoms include irritability. Her urge triggers include company of smokers. She smokes 1 pack of cigarettes per day. Compliance with prior treatments has been good.      Review of Systems  Constitutional: Positive for irritability and malaise/fatigue.  Respiratory: Negative for shortness of breath.   Gastrointestinal: Positive for constipation and heartburn.  Psychiatric/Behavioral: Positive for depression. The patient is nervous/anxious.   All other systems reviewed and are negative.      Objective:   Physical Exam Vitals reviewed.  Constitutional:      General: She is irritable. She is not in acute distress.    Appearance: She is well-developed. She is obese.  HENT:     Head: Normocephalic and atraumatic.     Right Ear: Tympanic membrane normal.     Left Ear: Tympanic membrane normal.  Eyes:     Pupils: Pupils are equal, round, and reactive to  light.  Neck:     Thyroid: No thyromegaly.  Cardiovascular:     Rate and Rhythm: Normal rate and regular rhythm.       Heart sounds: Normal heart sounds. No murmur heard.   Pulmonary:     Effort: Pulmonary effort is normal. No respiratory distress.     Breath sounds: Normal breath sounds. No wheezing.  Abdominal:     General: Bowel sounds are normal. There is no distension.     Palpations: Abdomen is soft.     Tenderness: There is no abdominal tenderness.  Musculoskeletal:        General: No tenderness. Normal range of motion.     Cervical back: Normal range of motion and neck supple.     Right lower leg: Edema (trace) present.     Left lower leg: Edema (trace) present.  Skin:    General: Skin is warm and dry.  Neurological:     Mental Status: She is alert and oriented to person, place, and time.     Cranial Nerves: No cranial nerve deficit.     Deep Tendon Reflexes: Reflexes are normal and symmetric.  Psychiatric:        Behavior: Behavior normal.        Thought Content: Thought content normal.        Judgment: Judgment normal.       BP 124/74   Pulse 80   Temp 98.6 F (37 C)   Ht 5' 5" (1.651 m)   Wt 198 lb (89.8 kg)   SpO2 98%   BMI 32.95 kg/m      Assessment & Plan:  Elizabeth Brewer comes in today with chief complaint of Medical Management of Chronic Issues, Hypertension, Anxiety, and Obesity   Diagnosis and orders addressed:  1. Essential hypertension - BMP8+EGFR  2. Chronic obstructive pulmonary disease, unspecified COPD type (HCC) - BMP8+EGFR  3. Gastroesophageal reflux disease, unspecified whether esophagitis present - BMP8+EGFR  4. Hypothyroidism, unspecified type - BMP8+EGFR - TSH  5. Anxiety - clonazePAM (KLONOPIN) 1 MG tablet; TAKE 1 TABLET DAILY AS NEEDED FOR ANXIETY  Dispense: 30 tablet; Refill: 5 - BMP8+EGFR  6. Benzodiazepine dependence (HCC) - clonazePAM (KLONOPIN) 1 MG tablet; TAKE 1 TABLET DAILY AS NEEDED FOR ANXIETY  Dispense: 30 tablet; Refill: 5 - BMP8+EGFR  7. Constipation, unspecified constipation type - BMP8+EGFR  8. Controlled  substance agreement signed - clonazePAM (KLONOPIN) 1 MG tablet; TAKE 1 TABLET DAILY AS NEEDED FOR ANXIETY  Dispense: 30 tablet; Refill: 5 - BMP8+EGFR  9. Moderate episode of recurrent major depressive disorder (HCC) - BMP8+EGFR  10. Hyperlipidemia, unspecified hyperlipidemia type - BMP8+EGFR  11. Overweight (BMI 25.0-29.9) - BMP8+EGFR  12. TOBACCO ABUSE - BMP8+EGFR  13. Metabolic syndrome Will start Ozempic today Encourage weight loss and exercise - BMP8+EGFR - Semaglutide,0.25 or 0.5MG/DOS, (OZEMPIC, 0.25 OR 0.5 MG/DOSE,) 2 MG/1.5ML SOPN; Inject 0.1875 mLs (0.25 mg total) into the skin once a week for 21 days, THEN 0.375 mLs (0.5 mg total) once a week for 21 days.  Dispense: 3 mL; Refill: 1  14. Post-menopausal - DG [facility] DEXA - BMP8+EGFR   Labs pending Patient reviewed in [place] controlled database, no flags noted. Contract and drug screen are up to date.  Health Maintenance reviewed Diet and exercise encouraged  Follow up plan: 3 months    Christy Hawks, FNP  

## 2020-03-15 NOTE — Patient Instructions (Signed)

## 2020-03-17 ENCOUNTER — Other Ambulatory Visit: Payer: Self-pay | Admitting: Family

## 2020-03-17 MED ORDER — POTASSIUM CHLORIDE CRYS ER 20 MEQ PO TBCR: 20.0000 meq | EXTENDED_RELEASE_TABLET | Freq: Two times a day (BID) | ORAL | 2 refills | Status: DC

## 2020-03-18 ENCOUNTER — Other Ambulatory Visit: Payer: Self-pay | Admitting: Family

## 2020-03-24 DIAGNOSIS — E538 Deficiency of other specified B group vitamins: Secondary | ICD-10-CM | POA: Diagnosis not present

## 2020-03-24 DIAGNOSIS — G43109 Migraine with aura, not intractable, without status migrainosus: Secondary | ICD-10-CM | POA: Diagnosis not present

## 2020-03-24 DIAGNOSIS — M542 Cervicalgia: Secondary | ICD-10-CM | POA: Diagnosis not present

## 2020-03-24 DIAGNOSIS — M25562 Pain in left knee: Secondary | ICD-10-CM | POA: Diagnosis not present

## 2020-03-24 DIAGNOSIS — M25561 Pain in right knee: Secondary | ICD-10-CM | POA: Diagnosis not present

## 2020-04-03 ENCOUNTER — Other Ambulatory Visit: Payer: Self-pay | Admitting: Family

## 2020-04-16 ENCOUNTER — Other Ambulatory Visit: Payer: Self-pay | Admitting: Family

## 2020-04-16 DIAGNOSIS — F1320 Sedative, hypnotic or anxiolytic dependence, uncomplicated: Secondary | ICD-10-CM

## 2020-04-16 DIAGNOSIS — F419 Anxiety disorder, unspecified: Secondary | ICD-10-CM

## 2020-04-29 ENCOUNTER — Other Ambulatory Visit: Payer: Self-pay

## 2020-04-29 MED ORDER — CYCLOBENZAPRINE HCL 10 MG PO TABS: 10.0000 mg | ORAL_TABLET | Freq: Three times a day (TID) | ORAL | 0 refills | Status: DC | PRN

## 2020-04-29 MED ORDER — MELOXICAM 15 MG PO TABS: 15.0000 mg | ORAL_TABLET | Freq: Every day | ORAL | 0 refills | Status: DC

## 2020-04-29 NOTE — Telephone Encounter (Signed)
  Prescription Request  04/29/2020  What is the name of the medication or equipment? Levothyroxine 88mg , Citalopram Meloxicam, Flexeril,  Omeprazole 20 mg  Have you contacted your pharmacy to request a refill? (if applicable) Yes  Which pharmacy would you like this sent to? [facility]   Patient notified that their request is being sent to the clinical staff for review and that they should receive a response within 2 business days.   Lenna Gilford' pt.  Please call pt.

## 2020-04-30 DIAGNOSIS — R05 Cough: Secondary | ICD-10-CM | POA: Diagnosis not present

## 2020-04-30 DIAGNOSIS — J441 Chronic obstructive pulmonary disease with (acute) exacerbation: Secondary | ICD-10-CM | POA: Diagnosis not present

## 2020-04-30 DIAGNOSIS — R112 Nausea with vomiting, unspecified: Secondary | ICD-10-CM | POA: Diagnosis not present

## 2020-05-15 ENCOUNTER — Other Ambulatory Visit: Payer: Self-pay | Admitting: Family

## 2020-05-15 DIAGNOSIS — I10 Essential (primary) hypertension: Secondary | ICD-10-CM

## 2020-06-11 DIAGNOSIS — R58 Hemorrhage, not elsewhere classified: Secondary | ICD-10-CM | POA: Diagnosis not present

## 2020-06-11 DIAGNOSIS — S81811A Laceration without foreign body, right lower leg, initial encounter: Secondary | ICD-10-CM | POA: Diagnosis not present

## 2020-06-14 ENCOUNTER — Other Ambulatory Visit: Payer: Self-pay

## 2020-06-14 ENCOUNTER — Ambulatory Visit: Payer: Medicare Other

## 2020-06-14 ENCOUNTER — Encounter: Payer: Self-pay | Admitting: Family

## 2020-06-14 ENCOUNTER — Ambulatory Visit (INDEPENDENT_AMBULATORY_CARE_PROVIDER_SITE_OTHER): Payer: Medicare Other | Admitting: Family

## 2020-06-14 VITALS — BP 148/81 | HR 88 | Temp 97.4°F | Ht 65.0 in | Wt 198.0 lb

## 2020-06-14 DIAGNOSIS — I10 Essential (primary) hypertension: Secondary | ICD-10-CM

## 2020-06-14 DIAGNOSIS — J449 Chronic obstructive pulmonary disease, unspecified: Secondary | ICD-10-CM

## 2020-06-14 DIAGNOSIS — F17200 Nicotine dependence, unspecified, uncomplicated: Secondary | ICD-10-CM

## 2020-06-14 DIAGNOSIS — K219 Gastro-esophageal reflux disease without esophagitis: Secondary | ICD-10-CM | POA: Diagnosis not present

## 2020-06-14 DIAGNOSIS — E785 Hyperlipidemia, unspecified: Secondary | ICD-10-CM

## 2020-06-14 DIAGNOSIS — F1320 Sedative, hypnotic or anxiolytic dependence, uncomplicated: Secondary | ICD-10-CM

## 2020-06-14 DIAGNOSIS — G43109 Migraine with aura, not intractable, without status migrainosus: Secondary | ICD-10-CM

## 2020-06-14 DIAGNOSIS — F331 Major depressive disorder, recurrent, moderate: Secondary | ICD-10-CM

## 2020-06-14 DIAGNOSIS — E039 Hypothyroidism, unspecified: Secondary | ICD-10-CM | POA: Diagnosis not present

## 2020-06-14 DIAGNOSIS — F419 Anxiety disorder, unspecified: Secondary | ICD-10-CM

## 2020-06-14 DIAGNOSIS — Z79899 Other long term (current) drug therapy: Secondary | ICD-10-CM

## 2020-06-14 DIAGNOSIS — E663 Overweight: Secondary | ICD-10-CM

## 2020-06-14 MED ORDER — BUSPIRONE HCL 7.5 MG PO TABS: 7.5000 mg | ORAL_TABLET | Freq: Three times a day (TID) | ORAL | 2 refills | Status: DC | PRN

## 2020-06-14 MED ORDER — CLONAZEPAM 1 MG PO TABS: ORAL_TABLET | ORAL | 5 refills | Status: DC

## 2020-06-14 NOTE — Patient Instructions (Signed)
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
## 2020-06-20 DIAGNOSIS — Z4802 Encounter for removal of sutures: Secondary | ICD-10-CM | POA: Diagnosis not present

## 2020-06-23 ENCOUNTER — Telehealth: Payer: Self-pay

## 2020-06-23 ENCOUNTER — Other Ambulatory Visit: Payer: Self-pay | Admitting: Family

## 2020-06-23 DIAGNOSIS — Z1231 Encounter for screening mammogram for malignant neoplasm of breast: Secondary | ICD-10-CM

## 2020-06-23 NOTE — Telephone Encounter (Signed)
Is this okay to order?  

## 2020-06-24 ENCOUNTER — Telehealth: Payer: Self-pay

## 2020-06-24 MED ORDER — LEVOTHYROXINE SODIUM 100 MCG PO TABS: 100.0000 ug | ORAL_TABLET | Freq: Every day | ORAL | 5 refills | Status: DC

## 2020-06-24 NOTE — Telephone Encounter (Signed)
Medication sent- attempted to contact patient -mailbox full

## 2020-06-24 NOTE — Telephone Encounter (Signed)
Pt wants refills sent to [facility] because they will be free

## 2020-06-24 NOTE — Telephone Encounter (Signed)
  Prescription Request  06/24/2020  What is the name of the medication or equipment? Levothyroxine, 100 mcg  Have you contacted your pharmacy to request a refill? (if applicable) Yes  Which pharmacy would you like this sent to? [facility]   Patient notified that their request is being sent to the clinical staff for review and that they should receive a response within 2 business days.

## 2020-06-24 NOTE — Telephone Encounter (Signed)
Lmtcb   [facility]

## 2020-06-27 NOTE — Telephone Encounter (Signed)
Ok to order 

## 2020-06-28 NOTE — Telephone Encounter (Signed)
Ordered

## 2020-06-28 NOTE — Addendum Note (Signed)
Addended by: Ladean Raya on: 06/28/2020 08:15 AM   Modules accepted: Orders

## 2020-06-30 ENCOUNTER — Telehealth: Payer: Self-pay

## 2020-06-30 ENCOUNTER — Other Ambulatory Visit: Payer: Self-pay | Admitting: Family

## 2020-06-30 NOTE — Telephone Encounter (Signed)
Nurse Practitioner from [employer] is at patient's home and did a test for peripheral artery disease called QuantaFlo.  The standard for them to alert the patient's PCP is anything below 0.90, patient's was 0.84 today.  She is also concerned because one of patient's legs is red and appears to be developing cellulitis.  Patient requested to see you tomorrow but you are unavailable.  I scheduled an appointment with Dr. Warrick Parisian in the morning.

## 2020-07-01 ENCOUNTER — Encounter: Payer: Self-pay | Admitting: Family Medicine

## 2020-07-01 ENCOUNTER — Ambulatory Visit (INDEPENDENT_AMBULATORY_CARE_PROVIDER_SITE_OTHER): Payer: Medicare Other | Admitting: Family Medicine

## 2020-07-01 ENCOUNTER — Other Ambulatory Visit: Payer: Self-pay

## 2020-07-01 VITALS — BP 127/73 | HR 75 | Temp 98.0°F | Ht 65.0 in | Wt 204.0 lb

## 2020-07-01 DIAGNOSIS — S81811D Laceration without foreign body, right lower leg, subsequent encounter: Secondary | ICD-10-CM

## 2020-07-01 DIAGNOSIS — L03115 Cellulitis of right lower limb: Secondary | ICD-10-CM

## 2020-07-01 MED ORDER — DOXYCYCLINE HYCLATE 100 MG PO TABS: 100.0000 mg | ORAL_TABLET | Freq: Two times a day (BID) | ORAL | 0 refills | Status: DC
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
## 2020-07-05 ENCOUNTER — Other Ambulatory Visit: Payer: Medicare Other

## 2020-07-13 ENCOUNTER — Other Ambulatory Visit: Payer: Self-pay

## 2020-07-13 ENCOUNTER — Ambulatory Visit (HOSPITAL_COMMUNITY)
Admission: RE | Admit: 2020-07-13 | Discharge: 2020-07-13 | Disposition: A | Discharge status: Home / Self Care | Payer: Medicare Other | Source: Ambulatory Visit | Attending: Family | Admitting: Family

## 2020-07-13 DIAGNOSIS — Z1231 Encounter for screening mammogram for malignant neoplasm of breast: Secondary | ICD-10-CM | POA: Insufficient documentation

## 2020-07-14 DIAGNOSIS — M25561 Pain in right knee: Secondary | ICD-10-CM | POA: Diagnosis not present

## 2020-07-14 DIAGNOSIS — M542 Cervicalgia: Secondary | ICD-10-CM | POA: Diagnosis not present

## 2020-07-14 DIAGNOSIS — G43109 Migraine with aura, not intractable, without status migrainosus: Secondary | ICD-10-CM | POA: Diagnosis not present

## 2020-07-14 DIAGNOSIS — E538 Deficiency of other specified B group vitamins: Secondary | ICD-10-CM | POA: Diagnosis not present

## 2020-07-14 DIAGNOSIS — M25562 Pain in left knee: Secondary | ICD-10-CM | POA: Diagnosis not present

## 2020-07-15 ENCOUNTER — Encounter: Payer: Self-pay | Admitting: Family

## 2020-07-15 ENCOUNTER — Other Ambulatory Visit: Payer: Self-pay | Admitting: Family

## 2020-07-15 ENCOUNTER — Ambulatory Visit (INDEPENDENT_AMBULATORY_CARE_PROVIDER_SITE_OTHER): Payer: Medicare Other

## 2020-07-15 ENCOUNTER — Other Ambulatory Visit: Payer: Self-pay

## 2020-07-15 ENCOUNTER — Ambulatory Visit (INDEPENDENT_AMBULATORY_CARE_PROVIDER_SITE_OTHER): Payer: Medicare Other | Admitting: Family

## 2020-07-15 VITALS — BP 132/72 | HR 76 | Temp 97.4°F | Ht 65.0 in | Wt 206.0 lb

## 2020-07-15 DIAGNOSIS — J441 Chronic obstructive pulmonary disease with (acute) exacerbation: Secondary | ICD-10-CM | POA: Diagnosis not present

## 2020-07-15 DIAGNOSIS — L03115 Cellulitis of right lower limb: Secondary | ICD-10-CM

## 2020-07-15 DIAGNOSIS — Z780 Asymptomatic menopausal state: Secondary | ICD-10-CM

## 2020-07-15 DIAGNOSIS — F1320 Sedative, hypnotic or anxiolytic dependence, uncomplicated: Secondary | ICD-10-CM

## 2020-07-15 DIAGNOSIS — F419 Anxiety disorder, unspecified: Secondary | ICD-10-CM

## 2020-07-15 MED ORDER — PREDNISONE 10 MG (21) PO TBPK: ORAL_TABLET | ORAL | 0 refills | Status: DC

## 2020-07-15 MED ORDER — BENZONATATE 200 MG PO CAPS: 200.0000 mg | ORAL_CAPSULE | Freq: Three times a day (TID) | ORAL | 1 refills | Status: DC | PRN

## 2020-07-15 MED ORDER — SULFAMETHOXAZOLE-TRIMETHOPRIM 800-160 MG PO TABS: 1.0000 | ORAL_TABLET | Freq: Two times a day (BID) | ORAL | 0 refills | Status: DC

## 2020-07-15 NOTE — Progress Notes (Signed)
Subjective:    Patient ID: Elizabeth Brewer, female    DOB: 1961-07-22, 59 y.o.   MRN: [id number]  Chief Complaint  Patient presents with  . Cellulitis    right leg rck it is getting sore and tiching    Pt presents to the office today to recheck cellulitis in right calf. She was seen on 06/28/20 and started on doxycycline 100 mg BID. She completed this.   Reports the area has improved. Denies any fever. Reports yellow discharge, mild tenderness of 3-5 out 10 when walking, and mild erythemas.  Cough This is a recurrent problem. The current episode started 1 to 4 weeks ago. The problem has been waxing and waning. The problem occurs every few minutes. The cough is productive of sputum. Associated symptoms include nasal congestion, rhinorrhea, shortness of breath and wheezing. Pertinent negatives include no ear congestion, ear pain or fever.      Review of Systems  Constitutional: Negative for fever.  HENT: Positive for rhinorrhea. Negative for ear pain.   Respiratory: Positive for cough, shortness of breath and wheezing.   All other systems reviewed and are negative.      Objective:   Physical Exam Vitals reviewed.  Constitutional:      General: She is not in acute distress.    Appearance: She is well-developed.  HENT:     Head: Normocephalic and atraumatic.  Eyes:     Pupils: Pupils are equal, round, and reactive to light.  Neck:     Thyroid: No thyromegaly.  Cardiovascular:     Rate and Rhythm: Normal rate and regular rhythm.     Heart sounds: Normal heart sounds. No murmur heard.   Pulmonary:     Effort: Pulmonary effort is normal. No respiratory distress.     Breath sounds: Rhonchi present. No wheezing.  Abdominal:     General: Bowel sounds are normal. There is no distension.     Palpations: Abdomen is soft.     Tenderness: There is no abdominal tenderness.  Musculoskeletal:        General: No tenderness. Normal range of motion.     Cervical back: Normal range of  motion and neck supple.  Skin:    General: Skin is warm and dry.     Comments: Abrasion on right calf approx 3X2 cm, yellow slough, slightly hard. No warmth present.  Neurological:     Mental Status: She is alert and oriented to person, place, and time.     Cranial Nerves: No cranial nerve deficit.     Deep Tendon Reflexes: Reflexes are normal and symmetric.  Psychiatric:        Behavior: Behavior normal.        Thought Content: Thought content normal.        Judgment: Judgment normal.             Assessment & Plan:  Leitha H Zia comes in today with chief complaint of Cellulitis (right leg rck it is getting sore and tiching )   Diagnosis and orders addressed:  1. Cellulitis of right lower extremity Keep elevated when possible Report any fever, erythemas, discharge, or warmth - sulfamethoxazole-trimethoprim (BACTRIM DS) 800-160 MG tablet; Take 1 tablet by mouth 2 (two) times daily.  Dispense: 14 tablet; Refill: 0  2. COPD exacerbation (HCC) Rest Force fluids Mucinex  - predniSONE (STERAPRED UNI-PAK 21 TAB) 10 MG (21) TBPK tablet; Use as directed  Dispense: 21 tablet; Refill: 0 - benzonatate (TESSALON) 200 MG capsule;  Take 1 capsule (200 mg total) by mouth 3 (three) times daily as needed.  Dispense: 30 capsule; Refill: 1  3. Post-menopause - DG [facility] DEXA    Health Maintenance reviewed Diet and exercise encouraged  Follow up plan: Keep chronic follow up and if area worsens   [name], FNP

## 2020-07-15 NOTE — Patient Instructions (Signed)

## 2020-07-18 DIAGNOSIS — M85851 Other specified disorders of bone density and structure, right thigh: Secondary | ICD-10-CM | POA: Diagnosis not present

## 2020-07-20 ENCOUNTER — Other Ambulatory Visit: Payer: Self-pay | Admitting: Family

## 2020-08-07 DIAGNOSIS — K047 Periapical abscess without sinus: Secondary | ICD-10-CM | POA: Diagnosis not present

## 2020-08-09 ENCOUNTER — Other Ambulatory Visit: Payer: Self-pay | Admitting: Family

## 2020-08-09 DIAGNOSIS — I10 Essential (primary) hypertension: Secondary | ICD-10-CM

## 2020-08-17 ENCOUNTER — Ambulatory Visit (HOSPITAL_COMMUNITY): Payer: Medicare Other

## 2020-08-19 ENCOUNTER — Other Ambulatory Visit: Payer: Self-pay

## 2020-08-19 ENCOUNTER — Ambulatory Visit (INDEPENDENT_AMBULATORY_CARE_PROVIDER_SITE_OTHER): Payer: Medicare Other | Admitting: Family Medicine

## 2020-08-19 ENCOUNTER — Encounter: Payer: Self-pay | Admitting: Family Medicine

## 2020-08-19 VITALS — BP 122/61 | HR 69 | Temp 97.9°F | Ht 65.0 in | Wt 205.5 lb

## 2020-08-19 DIAGNOSIS — L03115 Cellulitis of right lower limb: Secondary | ICD-10-CM | POA: Diagnosis not present

## 2020-08-19 MED ORDER — SILVER SULFADIAZINE 1 % EX CREA: 1.0000 "application " | TOPICAL_CREAM | Freq: Every day | CUTANEOUS | 0 refills | Status: DC

## 2020-08-19 MED ORDER — DOXYCYCLINE HYCLATE 100 MG PO TABS: 100.0000 mg | ORAL_TABLET | Freq: Two times a day (BID) | ORAL | 0 refills | Status: DC
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
## 2020-08-19 NOTE — Patient Instructions (Signed)

## 2020-08-26 LAB — ANAEROBIC AND AEROBIC CULTURE

## 2020-08-29 ENCOUNTER — Encounter: Payer: Self-pay | Admitting: Family Medicine

## 2020-08-29 ENCOUNTER — Ambulatory Visit (INDEPENDENT_AMBULATORY_CARE_PROVIDER_SITE_OTHER): Payer: Medicare Other | Admitting: Family Medicine

## 2020-08-29 ENCOUNTER — Other Ambulatory Visit: Payer: Self-pay

## 2020-08-29 VITALS — BP 128/66 | HR 62 | Temp 97.0°F | Ht 65.0 in | Wt 206.0 lb

## 2020-08-29 DIAGNOSIS — E039 Hypothyroidism, unspecified: Secondary | ICD-10-CM | POA: Diagnosis not present

## 2020-08-29 DIAGNOSIS — J449 Chronic obstructive pulmonary disease, unspecified: Secondary | ICD-10-CM | POA: Diagnosis not present

## 2020-08-29 DIAGNOSIS — S81801D Unspecified open wound, right lower leg, subsequent encounter: Secondary | ICD-10-CM

## 2020-08-29 DIAGNOSIS — G43109 Migraine with aura, not intractable, without status migrainosus: Secondary | ICD-10-CM | POA: Diagnosis not present

## 2020-08-29 DIAGNOSIS — I10 Essential (primary) hypertension: Secondary | ICD-10-CM | POA: Diagnosis not present

## 2020-08-29 DIAGNOSIS — T148XXD Other injury of unspecified body region, subsequent encounter: Secondary | ICD-10-CM

## 2020-08-29 DIAGNOSIS — K219 Gastro-esophageal reflux disease without esophagitis: Secondary | ICD-10-CM | POA: Diagnosis not present

## 2020-08-29 NOTE — Patient Instructions (Signed)
Wound Care, Adult Taking care of your wound properly can help to prevent pain, infection, and scarring. It can also help your wound to heal more quickly. How to care for your wound Wound care      Follow instructions from your health care provider about how to take care of your wound. Make sure you: ? Wash your hands with soap and water before you change the bandage (dressing). If soap and water are not available, use hand sanitizer. ? Change your dressing as told by your health care provider. ? Leave stitches (sutures), skin glue, or adhesive strips in place. These skin closures may need to stay in place for 2 weeks or longer. If adhesive strip edges start to loosen and curl up, you may trim the loose edges. Do not remove adhesive strips completely unless your health care provider tells you to do that.  Check your wound area every day for signs of infection. Check for: ? Redness, swelling, or pain. ? Fluid or blood. ? Warmth. ? Pus or a bad smell.  Ask your health care provider if you should clean the wound with mild soap and water. Doing this may include: ? Using a clean towel to pat the wound dry after cleaning it. Do not rub or scrub the wound. ? Applying a cream or ointment. Do this only as told by your health care provider. ? Covering the incision with a clean dressing.  Ask your health care provider when you can leave the wound uncovered.  Keep the dressing dry until your health care provider says it can be removed. Do not take baths, swim, use a hot tub, or do anything that would put the wound underwater until your health care provider approves. Ask your health care provider if you can take showers. You may only be allowed to take sponge baths. Medicines   If you were prescribed an antibiotic medicine, cream, or ointment, take or use the antibiotic as told by your health care provider. Do not stop taking or using the antibiotic even if your condition improves.  Take  over-the-counter and prescription medicines only as told by your health care provider. If you were prescribed pain medicine, take it 30 or more minutes before you do any wound care or as told by your health care provider. General instructions  Return to your normal activities as told by your health care provider. Ask your health care provider what activities are safe.  Do not scratch or pick at the wound.  Do not use any products that contain nicotine or tobacco, such as cigarettes and e-cigarettes. These may delay wound healing. If you need help quitting, ask your health care provider.  Keep all follow-up visits as told by your health care provider. This is important.  Eat a diet that includes protein, vitamin A, vitamin C, and other nutrient-rich foods to help the wound heal. ? Foods rich in protein include meat, dairy, beans, nuts, and other sources. ? Foods rich in vitamin A include carrots and dark green, leafy vegetables. ? Foods rich in vitamin C include citrus, tomatoes, and other fruits and vegetables. ? Nutrient-rich foods have protein, carbohydrates, fat, vitamins, or minerals. Eat a variety of healthy foods including vegetables, fruits, and whole grains. Contact a health care provider if:  You received a tetanus shot and you have swelling, severe pain, redness, or bleeding at the injection site.  Your pain is not controlled with medicine.  You have redness, swelling, or pain around the wound.    You have fluid or blood coming from the wound.  Your wound feels warm to the touch.  You have pus or a bad smell coming from the wound.  You have a fever or chills.  You are nauseous or you vomit.  You are dizzy. Get help right away if:  You have a red streak going away from your wound.  The edges of the wound open up and separate.  Your wound is bleeding, and the bleeding does not stop with gentle pressure.  You have a rash.  You faint.  You have trouble  breathing. Summary  Always wash your hands with soap and water before changing your bandage (dressing).  To help with healing, eat foods that are rich in protein, vitamin A, vitamin C, and other nutrients.  Check your wound every day for signs of infection. Contact your health care provider if you suspect that your wound is infected. This information is not intended to replace advice given to you by your health care provider. Make sure you discuss any questions you have with your health care provider. Document Revised: 12/08/2018 Document Reviewed: 03/06/2016 Elsevier Patient Education  2020 Elsevier Inc[employer]  
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
## 2020-08-30 LAB — CMP14+EGFR
ALT: 13 IU/L (ref 0–32)
AST: 20 IU/L (ref 0–40)
Albumin/Globulin Ratio: 1.7 (ref 1.2–2.2)
Albumin: 4 g/dL (ref 3.8–4.9)
Alkaline Phosphatase: 93 IU/L (ref 44–121)
BUN/Creatinine Ratio: 14 (ref 9–23)
BUN: 12 mg/dL (ref 6–24)
Bilirubin Total: 0.2 mg/dL (ref 0.0–1.2)
CO2: 24 mmol/L (ref 20–29)
Calcium: 9 mg/dL (ref 8.7–10.2)
Chloride: 101 mmol/L (ref 96–106)
Creatinine, Ser: 0.84 mg/dL (ref 0.57–1.00)
GFR calc Af Amer: 88 mL/min/{1.73_m2} (ref >59)
GFR calc non Af Amer: 76 mL/min/{1.73_m2} (ref >59)
Globulin, Total: 2.3 g/dL (ref 1.5–4.5)
Glucose: 107 mg/dL — ABNORMAL HIGH (ref 65–99)
Potassium: 3.6 mmol/L (ref 3.5–5.2)
Sodium: 141 mmol/L (ref 134–144)
Total Protein: 6.3 g/dL (ref 6.0–8.5)

## 2020-08-30 LAB — CBC WITH DIFFERENTIAL/PLATELET
Basophils Absolute: 0 10*3/uL (ref 0.0–0.2)
Basos: 0 %
EOS (ABSOLUTE): 0.2 10*3/uL (ref 0.0–0.4)
Eos: 3 %
Hematocrit: 37.8 % (ref 34.0–46.6)
Hemoglobin: 12.5 g/dL (ref 11.1–15.9)
Immature Grans (Abs): 0 10*3/uL (ref 0.0–0.1)
Immature Granulocytes: 0 %
Lymphocytes Absolute: 2 10*3/uL (ref 0.7–3.1)
Lymphs: 26 %
MCH: 32.1 pg (ref 26.6–33.0)
MCHC: 33.1 g/dL (ref 31.5–35.7)
MCV: 97 fL (ref 79–97)
Monocytes Absolute: 0.6 10*3/uL (ref 0.1–0.9)
Monocytes: 8 %
Neutrophils Absolute: 4.9 10*3/uL (ref 1.4–7.0)
Neutrophils: 63 %
Platelets: 211 10*3/uL (ref 150–450)
RBC: 3.89 x10E6/uL (ref 3.77–5.28)
RDW: 12.7 % (ref 11.7–15.4)
WBC: 7.7 10*3/uL (ref 3.4–10.8)

## 2020-08-30 LAB — TSH: TSH: 1.7 u[IU]/mL (ref 0.450–4.500)

## 2020-08-31 ENCOUNTER — Ambulatory Visit (HOSPITAL_COMMUNITY): Payer: Medicare Other

## 2020-09-14 ENCOUNTER — Encounter: Payer: Self-pay | Admitting: Family

## 2020-09-14 ENCOUNTER — Ambulatory Visit (INDEPENDENT_AMBULATORY_CARE_PROVIDER_SITE_OTHER): Payer: Medicare Other | Admitting: Family

## 2020-09-14 ENCOUNTER — Other Ambulatory Visit: Payer: Self-pay

## 2020-09-14 VITALS — BP 121/60 | HR 55 | Temp 97.4°F | Ht 65.0 in | Wt 205.4 lb

## 2020-09-14 DIAGNOSIS — Z79899 Other long term (current) drug therapy: Secondary | ICD-10-CM

## 2020-09-14 DIAGNOSIS — K5900 Constipation, unspecified: Secondary | ICD-10-CM | POA: Diagnosis not present

## 2020-09-14 DIAGNOSIS — I10 Essential (primary) hypertension: Secondary | ICD-10-CM

## 2020-09-14 DIAGNOSIS — G8929 Other chronic pain: Secondary | ICD-10-CM

## 2020-09-14 DIAGNOSIS — K219 Gastro-esophageal reflux disease without esophagitis: Secondary | ICD-10-CM

## 2020-09-14 DIAGNOSIS — M542 Cervicalgia: Secondary | ICD-10-CM | POA: Diagnosis not present

## 2020-09-14 DIAGNOSIS — J449 Chronic obstructive pulmonary disease, unspecified: Secondary | ICD-10-CM | POA: Diagnosis not present

## 2020-09-14 DIAGNOSIS — F17200 Nicotine dependence, unspecified, uncomplicated: Secondary | ICD-10-CM

## 2020-09-14 DIAGNOSIS — F1320 Sedative, hypnotic or anxiolytic dependence, uncomplicated: Secondary | ICD-10-CM | POA: Diagnosis not present

## 2020-09-14 DIAGNOSIS — F331 Major depressive disorder, recurrent, moderate: Secondary | ICD-10-CM

## 2020-09-14 DIAGNOSIS — E039 Hypothyroidism, unspecified: Secondary | ICD-10-CM

## 2020-09-14 DIAGNOSIS — G43109 Migraine with aura, not intractable, without status migrainosus: Secondary | ICD-10-CM

## 2020-09-14 DIAGNOSIS — E559 Vitamin D deficiency, unspecified: Secondary | ICD-10-CM | POA: Diagnosis not present

## 2020-09-14 DIAGNOSIS — F419 Anxiety disorder, unspecified: Secondary | ICD-10-CM

## 2020-09-14 DIAGNOSIS — E669 Obesity, unspecified: Secondary | ICD-10-CM

## 2020-09-14 MED ORDER — LUBIPROSTONE 24 MCG PO CAPS: 24.0000 ug | ORAL_CAPSULE | Freq: Two times a day (BID) | ORAL | 4 refills | Status: DC

## 2020-09-14 MED ORDER — CLONAZEPAM 1 MG PO TABS: ORAL_TABLET | ORAL | 5 refills | Status: DC

## 2020-09-14 NOTE — Patient Instructions (Signed)

## 2020-09-14 NOTE — Progress Notes (Signed)
Subjective:    Patient ID: Elizabeth Brewer, female    DOB: 01-19-1961, 60 y.o.   MRN: [id number]  Chief Complaint  Patient presents with  . Hypertension   PTcallsthe office todayfor chronic follow up. PT is followed by a neurologists every87month for chronic neck pain,seizure, and migraines.  [place] that occurred in [place].  Hypertension This is a chronic problem. The current episode started more than 1 year ago. The problem has been resolved since onset. The problem is controlled. Associated symptoms include anxiety, headaches, malaise/fatigue and shortness of breath. Pertinent negatives include no peripheral edema. Risk factors for coronary artery disease include dyslipidemia, obesity, sedentary lifestyle and smoking/tobacco exposure. The current treatment provides moderate improvement. Identifiable causes of hypertension include a thyroid problem.  Headache  This is a chronic problem. The current episode started more than 1 year ago. The problem occurs intermittently (2-3 times a week). Associated symptoms include phonophobia and photophobia. Her past medical history is significant for hypertension.  Gastroesophageal Reflux She complains of belching, heartburn and a hoarse voice. This is a chronic problem. The current episode started more than 1 year ago. The problem occurs occasionally. The problem has been waxing and waning. Associated symptoms include fatigue. Risk factors include obesity. She has tried a PPI for the symptoms.  Thyroid Problem Presents for follow-up visit. Symptoms include anxiety, constipation, depressed mood, dry skin, fatigue and hoarse voice. The symptoms have been stable. Her past medical history is significant for hyperlipidemia.  Nicotine Dependence Presents for follow-up visit. Symptoms include fatigue. Her urge triggers include company of smokers. She smokes < 1/2 a pack of cigarettes  per day.  Hyperlipidemia This is a chronic problem. The current episode started more than 1 year ago. Associated symptoms include shortness of breath. Current antihyperlipidemic treatment includes statins. The current treatment provides mild improvement of lipids. Risk factors for coronary artery disease include dyslipidemia, hypertension, a sedentary lifestyle and post-menopausal.  Anxiety Presents for follow-up visit. Symptoms include depressed mood, excessive worry, nervous/anxious behavior, restlessness and shortness of breath. Symptoms occur occasionally. The severity of symptoms is moderate.    Depression        This is a chronic problem.  The current episode started more than 1 year ago.   The problem occurs intermittently.  Associated symptoms include fatigue, irritable, restlessness and headaches.  Associated symptoms include no helplessness and no hopelessness.  Past medical history includes thyroid problem and anxiety.   Constipation This is a chronic problem. The current episode started more than 1 year ago. Her stool frequency is 2 to 3 times per week. She has tried laxatives for the symptoms.  COPD Pt smoking about 15 cigarettes a day. Using BWellingtondaily.     Review of Systems  Constitutional: Positive for fatigue and malaise/fatigue.  HENT: Positive for hoarse voice.   Eyes: Positive for photophobia.  Respiratory: Positive for shortness of breath.   Gastrointestinal: Positive for constipation and heartburn.  Neurological: Positive for headaches.  Psychiatric/Behavioral: Positive for depression. The patient is nervous/anxious.   All other systems reviewed and are negative.      Objective:   Physical Exam Vitals reviewed.  Constitutional:      General: She is irritable. She is not in acute distress.    Appearance: She is well-developed and well-nourished. She is obese.  HENT:     Head: Normocephalic and atraumatic.     Right Ear: Tympanic membrane  normal.     Left Ear:  Tympanic membrane normal.     Mouth/Throat:     Mouth: Oropharynx is clear and moist.  Eyes:     Pupils: Pupils are equal, round, and reactive to light.  Neck:     Thyroid: No thyromegaly.  Cardiovascular:     Rate and Rhythm: Normal rate and regular rhythm.     Pulses: Intact distal pulses.     Heart sounds: Normal heart sounds. No murmur heard.   Pulmonary:     Effort: Pulmonary effort is normal. No respiratory distress.     Breath sounds: Rhonchi present. No wheezing.  Abdominal:     General: Bowel sounds are normal. There is no distension.     Palpations: Abdomen is soft.     Tenderness: There is no abdominal tenderness.  Musculoskeletal:        General: No tenderness. Normal range of motion.     Cervical back: Normal range of motion and neck supple.     Right lower leg: Edema (trace) present.     Left lower leg: Edema (trace) present.  Skin:    General: Skin is warm and dry.  Neurological:     Mental Status: She is alert and oriented to person, place, and time.     Cranial Nerves: No cranial nerve deficit.     Deep Tendon Reflexes: Reflexes are normal and symmetric.  Psychiatric:        Mood and Affect: Mood and affect normal.        Behavior: Behavior normal.        Thought Content: Thought content normal.        Judgment: Judgment normal.       BP 121/60   Pulse (!) 55   Temp (!) 97.4 F (36.3 C) (Temporal)   Ht _0  (1.651 m)   Wt 205 lb 6.4 oz (93.2 kg)   BMI 34.18 kg/m      Assessment & Plan:  Elizabeth Brewer comes in today with chief complaint of Hypertension   Diagnosis and orders addressed:  1. Anxiety - clonazePAM (KLONOPIN) 1 MG tablet; TAKE 1 TABLET DAILY AS NEEDED FOR ANXIETY  Dispense: 30 tablet; Refill: 5 - CMP14+EGFR  2. Benzodiazepine dependence (HCC) - clonazePAM (KLONOPIN) 1 MG tablet; TAKE 1 TABLET DAILY AS NEEDED FOR ANXIETY  Dispense: 30 tablet; Refill: 5 - CMP14+EGFR  3. Controlled substance agreement signed - clonazePAM  (KLONOPIN) 1 MG tablet; TAKE 1 TABLET DAILY AS NEEDED FOR ANXIETY  Dispense: 30 tablet; Refill: 5 - CMP14+EGFR  4. Migraine with aura and without status migrainosus, not intractable - CMP14+EGFR  5. Essential hypertension - CMP14+EGFR  6. Chronic obstructive pulmonary disease, unspecified COPD type ([place]) - CMP14+EGFR  7. Gastroesophageal reflux disease, unspecified whether esophagitis present - CMP14+EGFR  8. Hypothyroidism, unspecified type - CMP14+EGFR - TSH  9. Chronic neck pain - CMP14+EGFR  10. Vitamin D deficiency - CMP14+EGFR  11. Obesity (BMI 30-39.9) - CMP14+EGFR  12. Constipation, unspecified constipation type Will start Amitiza today Force fluids - lubiprostone (AMITIZA) 24 MCG capsule; Take 1 capsule (24 mcg total) by mouth 2 (two) times daily with a meal.  Dispense: 90 capsule; Refill: 4 - CMP14+EGFR  13. TOBACCO ABUSE - CMP14+EGFR  14. Moderate episode of recurrent major depressive disorder ([place]) - CMP14+EGFR   Labs pending Patient reviewed in [place] controlled database, no flags noted. Contract and drug screen are up to date.   Health Maintenance reviewed Diet and exercise  encouraged  Follow up plan: 3 months    [name], FNP

## 2020-09-16 DIAGNOSIS — S81801A Unspecified open wound, right lower leg, initial encounter: Secondary | ICD-10-CM | POA: Diagnosis not present

## 2020-09-16 DIAGNOSIS — E079 Disorder of thyroid, unspecified: Secondary | ICD-10-CM | POA: Diagnosis not present

## 2020-09-16 DIAGNOSIS — Z87891 Personal history of nicotine dependence: Secondary | ICD-10-CM | POA: Diagnosis not present

## 2020-09-16 DIAGNOSIS — L97919 Non-pressure chronic ulcer of unspecified part of right lower leg with unspecified severity: Secondary | ICD-10-CM | POA: Diagnosis not present

## 2020-09-16 DIAGNOSIS — J449 Chronic obstructive pulmonary disease, unspecified: Secondary | ICD-10-CM | POA: Diagnosis not present

## 2020-09-16 DIAGNOSIS — Z79899 Other long term (current) drug therapy: Secondary | ICD-10-CM | POA: Diagnosis not present

## 2020-09-16 DIAGNOSIS — I10 Essential (primary) hypertension: Secondary | ICD-10-CM | POA: Diagnosis not present

## 2020-09-16 DIAGNOSIS — R600 Localized edema: Secondary | ICD-10-CM | POA: Diagnosis not present

## 2020-09-23 ENCOUNTER — Other Ambulatory Visit (HOSPITAL_COMMUNITY): Payer: Self-pay | Admitting: Family

## 2020-09-23 DIAGNOSIS — Z1231 Encounter for screening mammogram for malignant neoplasm of breast: Secondary | ICD-10-CM

## 2020-10-06 DIAGNOSIS — M79604 Pain in right leg: Secondary | ICD-10-CM | POA: Diagnosis not present

## 2020-10-06 DIAGNOSIS — M79661 Pain in right lower leg: Secondary | ICD-10-CM | POA: Diagnosis not present

## 2020-10-06 DIAGNOSIS — M7989 Other specified soft tissue disorders: Secondary | ICD-10-CM | POA: Diagnosis not present

## 2020-10-06 DIAGNOSIS — S81801A Unspecified open wound, right lower leg, initial encounter: Secondary | ICD-10-CM | POA: Diagnosis not present

## 2020-10-10 DIAGNOSIS — S81801A Unspecified open wound, right lower leg, initial encounter: Secondary | ICD-10-CM | POA: Diagnosis not present

## 2020-10-18 DIAGNOSIS — Z87891 Personal history of nicotine dependence: Secondary | ICD-10-CM | POA: Diagnosis not present

## 2020-10-18 DIAGNOSIS — S81801D Unspecified open wound, right lower leg, subsequent encounter: Secondary | ICD-10-CM | POA: Diagnosis not present

## 2020-10-18 DIAGNOSIS — S81801A Unspecified open wound, right lower leg, initial encounter: Secondary | ICD-10-CM | POA: Diagnosis not present

## 2020-10-18 DIAGNOSIS — Z79899 Other long term (current) drug therapy: Secondary | ICD-10-CM | POA: Diagnosis not present

## 2020-10-18 DIAGNOSIS — I10 Essential (primary) hypertension: Secondary | ICD-10-CM | POA: Diagnosis not present

## 2020-10-18 DIAGNOSIS — E079 Disorder of thyroid, unspecified: Secondary | ICD-10-CM | POA: Diagnosis not present

## 2020-10-18 DIAGNOSIS — J449 Chronic obstructive pulmonary disease, unspecified: Secondary | ICD-10-CM | POA: Diagnosis not present

## 2020-10-20 ENCOUNTER — Other Ambulatory Visit: Payer: Self-pay | Admitting: Family

## 2020-10-20 DIAGNOSIS — Z79899 Other long term (current) drug therapy: Secondary | ICD-10-CM | POA: Diagnosis not present

## 2020-10-20 DIAGNOSIS — M25562 Pain in left knee: Secondary | ICD-10-CM | POA: Diagnosis not present

## 2020-10-20 DIAGNOSIS — E538 Deficiency of other specified B group vitamins: Secondary | ICD-10-CM | POA: Diagnosis not present

## 2020-10-20 DIAGNOSIS — F419 Anxiety disorder, unspecified: Secondary | ICD-10-CM

## 2020-10-20 DIAGNOSIS — G43109 Migraine with aura, not intractable, without status migrainosus: Secondary | ICD-10-CM | POA: Diagnosis not present

## 2020-10-20 DIAGNOSIS — M25561 Pain in right knee: Secondary | ICD-10-CM | POA: Diagnosis not present

## 2020-10-20 DIAGNOSIS — F1320 Sedative, hypnotic or anxiolytic dependence, uncomplicated: Secondary | ICD-10-CM

## 2020-10-21 ENCOUNTER — Other Ambulatory Visit: Payer: Self-pay

## 2020-10-21 ENCOUNTER — Ambulatory Visit (HOSPITAL_COMMUNITY)
Admission: RE | Admit: 2020-10-21 | Discharge: 2020-10-21 | Disposition: A | Discharge status: Home / Self Care | Payer: Medicare Other | Source: Ambulatory Visit | Attending: Family | Admitting: Family

## 2020-10-21 DIAGNOSIS — Z1231 Encounter for screening mammogram for malignant neoplasm of breast: Secondary | ICD-10-CM | POA: Insufficient documentation

## 2020-10-25 ENCOUNTER — Other Ambulatory Visit: Payer: Self-pay | Admitting: Family

## 2020-10-31 ENCOUNTER — Other Ambulatory Visit: Payer: Self-pay | Admitting: Family

## 2020-10-31 DIAGNOSIS — I10 Essential (primary) hypertension: Secondary | ICD-10-CM

## 2020-11-09 ENCOUNTER — Other Ambulatory Visit: Payer: Self-pay | Admitting: Family

## 2020-11-23 ENCOUNTER — Other Ambulatory Visit: Payer: Self-pay | Admitting: Family

## 2020-11-24 ENCOUNTER — Encounter: Payer: Self-pay | Admitting: *Deleted

## 2020-12-15 ENCOUNTER — Ambulatory Visit: Payer: Medicare Other | Admitting: Family

## 2020-12-16 ENCOUNTER — Other Ambulatory Visit: Payer: Self-pay

## 2020-12-19 ENCOUNTER — Ambulatory Visit (INDEPENDENT_AMBULATORY_CARE_PROVIDER_SITE_OTHER): Payer: Medicare Other

## 2020-12-19 VITALS — Ht 65.0 in | Wt 199.0 lb

## 2020-12-19 DIAGNOSIS — Z0000 Encounter for general adult medical examination without abnormal findings: Secondary | ICD-10-CM | POA: Diagnosis not present

## 2020-12-19 NOTE — Patient Instructions (Signed)
Elizabeth Brewer , Thank you for taking time to come for your Medicare Wellness Visit. I appreciate your ongoing commitment to your health goals. Please review the following plan we discussed and let me know if I can assist you in the future.   Screening recommendations/referrals: Colonoscopy: Done 01/04/2014 - Repeat in 10 years Mammogram: Done 10/21/20 - Repeat annually Bone Density: Done 07/18/2020 - Repeat every 2 years Recommended yearly ophthalmology/optometry visit for glaucoma screening and checkup Recommended yearly dental visit for hygiene and checkup  Vaccinations: Influenza vaccine: Done 05/14/2020 - Repeat annually Pneumococcal vaccine: Pneumovax-23 Done 03/13/2018 - due for Prevnar-13 Tdap vaccine: Done 03/10/2015 - Repeat in 10 years Shingles vaccine: Shingrix discussed. Please contact your pharmacy for coverage information.   Covid-19: Done - patient to bring card to office so that we can add dates to chart  Advanced directives: Advance directive discussed with you today. I have provided a copy for you to complete at home and have notarized. Once this is complete please bring a copy in to our office so we can scan it into your chart.   Conditions/risks identified: Continue fall prevention - Try to exercise for 30 minutes each day. If you don't feel like eating 3 full meals per day, try multiple healthy snacks throughout the day.  Next appointment: Follow up in one year for your annual wellness visit.   Preventive Care 40-64 Years, Female Preventive care refers to lifestyle choices and visits with your health care provider that can promote health and wellness. What does preventive care include?  A yearly physical exam. This is also called an annual well check.  Dental exams once or twice a year.  Routine eye exams. Ask your health care provider how often you should have your eyes checked.  Personal lifestyle choices, including:  Daily care of your teeth and gums.  Regular  physical activity.  Eating a healthy diet.  Avoiding tobacco and drug use.  Limiting alcohol use.  Practicing safe sex.  Taking low-dose aspirin daily starting at age 30.  Taking vitamin and mineral supplements as recommended by your health care provider. What happens during an annual well check? The services and screenings done by your health care provider during your annual well check will depend on your age, overall health, lifestyle risk factors, and family history of disease. Counseling  Your health care provider may ask you questions about your:  Alcohol use.  Tobacco use.  Drug use.  Emotional well-being.  Home and relationship well-being.  Sexual activity.  Eating habits.  Work and work Statistician.  Method of birth control.  Menstrual cycle.  Pregnancy history. Screening  You may have the following tests or measurements:  Height, weight, and BMI.  Blood pressure.  Lipid and cholesterol levels. These may be checked every 5 years, or more frequently if you are over 20 years old.  Skin check.  Lung cancer screening. You may have this screening every year starting at age 75 if you have a 30-pack-year history of smoking and currently smoke or have quit within the past 15 years.  Fecal occult blood test (FOBT) of the stool. You may have this test every year starting at age 27.  Flexible sigmoidoscopy or colonoscopy. You may have a sigmoidoscopy every 5 years or a colonoscopy every 10 years starting at age 45.  Hepatitis C blood test.  Hepatitis B blood test.  Sexually transmitted disease (STD) testing.  Diabetes screening. This is done by checking your blood sugar (glucose) after you  have not eaten for a while (fasting). You may have this done every 1-3 years.  Mammogram. This may be done every 1-2 years. Talk to your health care provider about when you should start having regular mammograms. This may depend on whether you have a family history of  breast cancer.  BRCA-related cancer screening. This may be done if you have a family history of breast, ovarian, tubal, or peritoneal cancers.  Pelvic exam and Pap test. This may be done every 3 years starting at age 44. Starting at age 15, this may be done every 5 years if you have a Pap test in combination with an HPV test.  Bone density scan. This is done to screen for osteoporosis. You may have this scan if you are at high risk for osteoporosis. Discuss your test results, treatment options, and if necessary, the need for more tests with your health care provider. Vaccines  Your health care provider may recommend certain vaccines, such as:  Influenza vaccine. This is recommended every year.  Tetanus, diphtheria, and acellular pertussis (Tdap, Td) vaccine. You may need a Td booster every 10 years.  Zoster vaccine. You may need this after age 74.  Pneumococcal 13-valent conjugate (PCV13) vaccine. You may need this if you have certain conditions and were not previously vaccinated.  Pneumococcal polysaccharide (PPSV23) vaccine. You may need one or two doses if you smoke cigarettes or if you have certain conditions. Talk to your health care provider about which screenings and vaccines you need and how often you need them. This information is not intended to replace advice given to you by your health care provider. Make sure you discuss any questions you have with your health care provider. Document Released: 09/16/2015 Document Revised: 05/09/2016 Document Reviewed: 06/21/2015 Elsevier Interactive Patient Education  2017 [place] Prevention in the Home Falls can cause injuries. They can happen to people of all ages. There are many things you can do to make your home safe and to help prevent falls. What can I do on the outside of my home?  Regularly fix the edges of walkways and driveways and fix any cracks.  Remove anything that might make you trip as you walk through a  door, such as a raised step or threshold.  Trim any bushes or trees on the path to your home.  Use bright outdoor lighting.  Clear any walking paths of anything that might make someone trip, such as rocks or tools.  Regularly check to see if handrails are loose or broken. Make sure that both sides of any steps have handrails.  Any raised decks and porches should have guardrails on the edges.  Have any leaves, snow, or ice cleared regularly.  Use sand or salt on walking paths during winter.  Clean up any spills in your garage right away. This includes oil or grease spills. What can I do in the bathroom?  Use night lights.  Install grab bars by the toilet and in the tub and shower. Do not use towel bars as grab bars.  Use non-skid mats or decals in the tub or shower.  If you need to sit down in the shower, use a plastic, non-slip stool.  Keep the floor dry. Clean up any water that spills on the floor as soon as it happens.  Remove soap buildup in the tub or shower regularly.  Attach bath mats securely with double-sided non-slip rug tape.  Do not have throw rugs and other  things on the floor that can make you trip. What can I do in the bedroom?  Use night lights.  Make sure that you have a light by your bed that is easy to reach.  Do not use any sheets or blankets that are too big for your bed. They should not hang down onto the floor.  Have a firm chair that has side arms. You can use this for support while you get dressed.  Do not have throw rugs and other things on the floor that can make you trip. What can I do in the kitchen?  Clean up any spills right away.  Avoid walking on wet floors.  Keep items that you use a lot in easy-to-reach places.  If you need to reach something above you, use a strong step stool that has a grab bar.  Keep electrical cords out of the way.  Do not use floor polish or wax that makes floors slippery. If you must use wax, use  non-skid floor wax.  Do not have throw rugs and other things on the floor that can make you trip. What can I do with my stairs?  Do not leave any items on the stairs.  Make sure that there are handrails on both sides of the stairs and use them. Fix handrails that are broken or loose. Make sure that handrails are as long as the stairways.  Check any carpeting to make sure that it is firmly attached to the stairs. Fix any carpet that is loose or worn.  Avoid having throw rugs at the top or bottom of the stairs. If you do have throw rugs, attach them to the floor with carpet tape.  Make sure that you have a light switch at the top of the stairs and the bottom of the stairs. If you do not have them, ask someone to add them for you. What else can I do to help prevent falls?  Wear shoes that:  Do not have high heels.  Have rubber bottoms.  Are comfortable and fit you well.  Are closed at the toe. Do not wear sandals.  If you use a stepladder:  Make sure that it is fully opened. Do not climb a closed stepladder.  Make sure that both sides of the stepladder are locked into place.  Ask someone to hold it for you, if possible.  Clearly mark and make sure that you can see:  Any grab bars or handrails.  First and last steps.  Where the edge of each step is.  Use tools that help you move around (mobility aids) if they are needed. These include:  Canes.  Walkers.  Scooters.  Crutches.  Turn on the lights when you go into a dark area. Replace any light bulbs as soon as they burn out.  Set up your furniture so you have a clear path. Avoid moving your furniture around.  If any of your floors are uneven, fix them.  If there are any pets around you, be aware of where they are.  Review your medicines with your doctor. Some medicines can make you feel dizzy. This can increase your chance of falling. Ask your doctor what other things that you can do to help prevent falls. This  information is not intended to replace advice given to you by your health care provider. Make sure you discuss any questions you have with your health care provider. Document Released: 06/16/2009 Document Revised: 01/26/2016 Document Reviewed: 09/24/2014 Elsevier Interactive Patient  Education  2017 [employer]  
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
## 2020-12-20 ENCOUNTER — Other Ambulatory Visit: Payer: Self-pay

## 2020-12-20 ENCOUNTER — Encounter: Payer: Self-pay | Admitting: Family

## 2020-12-20 ENCOUNTER — Ambulatory Visit (INDEPENDENT_AMBULATORY_CARE_PROVIDER_SITE_OTHER): Payer: Medicare Other | Admitting: Family

## 2020-12-20 VITALS — BP 126/68 | HR 62 | Temp 97.3°F | Ht 65.0 in | Wt 196.2 lb

## 2020-12-20 DIAGNOSIS — G8929 Other chronic pain: Secondary | ICD-10-CM

## 2020-12-20 DIAGNOSIS — F1320 Sedative, hypnotic or anxiolytic dependence, uncomplicated: Secondary | ICD-10-CM

## 2020-12-20 DIAGNOSIS — Z79899 Other long term (current) drug therapy: Secondary | ICD-10-CM | POA: Diagnosis not present

## 2020-12-20 DIAGNOSIS — K219 Gastro-esophageal reflux disease without esophagitis: Secondary | ICD-10-CM

## 2020-12-20 DIAGNOSIS — G43109 Migraine with aura, not intractable, without status migrainosus: Secondary | ICD-10-CM | POA: Diagnosis not present

## 2020-12-20 DIAGNOSIS — E559 Vitamin D deficiency, unspecified: Secondary | ICD-10-CM

## 2020-12-20 DIAGNOSIS — I10 Essential (primary) hypertension: Secondary | ICD-10-CM

## 2020-12-20 DIAGNOSIS — K5900 Constipation, unspecified: Secondary | ICD-10-CM | POA: Diagnosis not present

## 2020-12-20 DIAGNOSIS — J449 Chronic obstructive pulmonary disease, unspecified: Secondary | ICD-10-CM

## 2020-12-20 DIAGNOSIS — E039 Hypothyroidism, unspecified: Secondary | ICD-10-CM

## 2020-12-20 DIAGNOSIS — M542 Cervicalgia: Secondary | ICD-10-CM

## 2020-12-20 DIAGNOSIS — R600 Localized edema: Secondary | ICD-10-CM

## 2020-12-20 DIAGNOSIS — F321 Major depressive disorder, single episode, moderate: Secondary | ICD-10-CM

## 2020-12-20 DIAGNOSIS — E669 Obesity, unspecified: Secondary | ICD-10-CM

## 2020-12-20 DIAGNOSIS — F419 Anxiety disorder, unspecified: Secondary | ICD-10-CM

## 2020-12-20 DIAGNOSIS — F17200 Nicotine dependence, unspecified, uncomplicated: Secondary | ICD-10-CM

## 2020-12-20 MED ORDER — CITALOPRAM HYDROBROMIDE 40 MG PO TABS: ORAL_TABLET | ORAL | 1 refills | Status: DC

## 2020-12-20 MED ORDER — CYCLOBENZAPRINE HCL 10 MG PO TABS: 10.0000 mg | ORAL_TABLET | Freq: Three times a day (TID) | ORAL | 0 refills | Status: DC | PRN

## 2020-12-20 MED ORDER — CLONAZEPAM 1 MG PO TABS: ORAL_TABLET | ORAL | 2 refills | Status: DC

## 2020-12-20 MED ORDER — FUROSEMIDE 20 MG PO TABS: 20.0000 mg | ORAL_TABLET | Freq: Every day | ORAL | 0 refills | Status: DC

## 2020-12-20 NOTE — Progress Notes (Signed)
Subjective:    Patient ID: Elizabeth Brewer, female    DOB: Jan 17, 1961, 60 y.o.   MRN: [id number]  Chief Complaint  Patient presents with  . Migraine    3 month check up   PTpresents to the office todayfor chronic follow up. PT is followed by a neurologists every18months for chronic neck pain,seizure, and migraines.  She states her brother died in 10/27/22 and this has been really hard on her. Migraine  This is a chronic problem. The current episode started more than 1 year ago. The problem occurs intermittently (2-3 times a week). Associated symptoms include nausea, phonophobia and photophobia. Her past medical history is significant for hypertension.  Hypertension This is a chronic problem. The current episode started more than 1 year ago. The problem has been resolved since onset. The problem is controlled. Associated symptoms include anxiety and malaise/fatigue. Pertinent negatives include no peripheral edema or shortness of breath. Risk factors for coronary artery disease include dyslipidemia, diabetes mellitus, obesity and sedentary lifestyle. The current treatment provides moderate improvement. There is no history of heart failure. Identifiable causes of hypertension include a thyroid problem.  Gastroesophageal Reflux She complains of belching, heartburn and nausea. This is a chronic problem. The current episode started more than 1 year ago. The problem occurs occasionally. The problem has been waxing and waning. Associated symptoms include fatigue. She has tried a PPI for the symptoms.  Thyroid Problem Presents for follow-up visit. Symptoms include constipation, depressed mood, dry skin and fatigue. The symptoms have been stable. There is no history of heart failure.  Nicotine Dependence Presents for follow-up visit. Symptoms include fatigue and irritability. Her urge triggers include company of smokers. The symptoms have been stable. She smokes < 1/2 a pack of cigarettes per day.   Depression        This is a chronic problem.  The current episode started more than 1 year ago.   The onset quality is gradual.   The problem occurs intermittently.  Associated symptoms include fatigue, helplessness, hopelessness, irritable, restlessness, decreased interest and sad.  Past medical history includes thyroid problem and anxiety.   Constipation This is a chronic problem. The current episode started more than 1 year ago. The problem has been waxing and waning since onset. Associated symptoms include nausea.  Anxiety Presents for follow-up visit. Symptoms include depressed mood, excessive worry, irritability, nausea, panic and restlessness. Patient reports no shortness of breath. Symptoms occur occasionally.    COPD Continues to smoke 1/2 pack a day. Has intermittent SOB.    Review of Systems  Constitutional: Positive for fatigue, irritability and malaise/fatigue.  Eyes: Positive for photophobia.  Respiratory: Negative for shortness of breath.   Gastrointestinal: Positive for constipation, heartburn and nausea.  Psychiatric/Behavioral: Positive for depression.       Objective:   Physical Exam Vitals reviewed.  Constitutional:      General: She is irritable. She is not in acute distress.    Appearance: She is well-developed.  HENT:     Head: Normocephalic and atraumatic.     Right Ear: Tympanic membrane normal.     Left Ear: Tympanic membrane normal.  Eyes:     Pupils: Pupils are equal, round, and reactive to light.  Neck:     Thyroid: No thyromegaly.  Cardiovascular:     Rate and Rhythm: Normal rate and regular rhythm.     Heart sounds: Normal heart sounds. No murmur heard.   Pulmonary:     Effort: Pulmonary effort is  normal. No respiratory distress.     Breath sounds: Normal breath sounds. No wheezing.  Abdominal:     General: Bowel sounds are normal. There is no distension.     Palpations: Abdomen is soft.     Tenderness: There is no abdominal tenderness.   Musculoskeletal:        General: No tenderness. Normal range of motion.     Cervical back: Normal range of motion and neck supple.  Skin:    General: Skin is warm and dry.  Neurological:     Mental Status: She is alert and oriented to person, place, and time.     Cranial Nerves: No cranial nerve deficit.     Deep Tendon Reflexes: Reflexes are normal and symmetric.  Psychiatric:        Mood and Affect: Affect is tearful.        Behavior: Behavior normal.        Thought Content: Thought content normal.        Judgment: Judgment normal.          BP 126/68   Pulse 62   Temp (!) 97.3 F (36.3 C) (Temporal)   Ht $R'5\' 5"'WW$  (1.651 m)   Wt 196 lb 3.2 oz (89 kg)   BMI 32.65 kg/m   Assessment & Plan:  Elizabeth Brewer comes in today with chief complaint of Migraine (3 month check up)   Diagnosis and orders addressed:  1. Anxiety - clonazePAM (KLONOPIN) 1 MG tablet; TAKE 1 TABLET DAILY AS NEEDED FOR ANXIETY  Dispense: 30 tablet; Refill: 2 - citalopram (CELEXA) 40 MG tablet; TAKE ONE (1) TABLET EACH DAY  Dispense: 90 tablet; Refill: 1 - Ambulatory referral to Psychiatry - CMP14+EGFR - CBC with Differential/Platelet  2. Benzodiazepine dependence (HCC) - clonazePAM (KLONOPIN) 1 MG tablet; TAKE 1 TABLET DAILY AS NEEDED FOR ANXIETY  Dispense: 30 tablet; Refill: 2 - citalopram (CELEXA) 40 MG tablet; TAKE ONE (1) TABLET EACH DAY  Dispense: 90 tablet; Refill: 1 - Ambulatory referral to Psychiatry - CMP14+EGFR - CBC with Differential/Platelet  3. Controlled substance agreement signed - clonazePAM (KLONOPIN) 1 MG tablet; TAKE 1 TABLET DAILY AS NEEDED FOR ANXIETY  Dispense: 30 tablet; Refill: 2 - CMP14+EGFR - CBC with Differential/Platelet  4. Localized edema - furosemide (LASIX) 20 MG tablet; Take 1 tablet (20 mg total) by mouth daily.  Dispense: 30 tablet; Refill: 0 - CMP14+EGFR - CBC with Differential/Platelet  5. Essential hypertension - CMP14+EGFR - CBC with  Differential/Platelet  6. Migraine with aura and without status migrainosus, not intractable - CMP14+EGFR - CBC with Differential/Platelet  7. Chronic obstructive pulmonary disease, unspecified COPD type ([place]) - CMP14+EGFR - CBC with Differential/Platelet  8. Gastroesophageal reflux disease, unspecified whether esophagitis present - CMP14+EGFR - CBC with Differential/Platelet  9. Hypothyroidism, unspecified type - CMP14+EGFR - CBC with Differential/Platelet - TSH  10. Chronic neck pain - CMP14+EGFR - CBC with Differential/Platelet  11. Constipation, unspecified constipation type - CMP14+EGFR - CBC with Differential/Platelet  12. Obesity (BMI 30-39.9) - CMP14+EGFR - CBC with Differential/Platelet  13. TOBACCO ABUSE - CMP14+EGFR - CBC with Differential/Platelet  14. Vitamin D deficiency - CMP14+EGFR - CBC with Differential/Platelet  15. Depression, major, single episode, moderate ([place]) - Ambulatory referral to Psychiatry - CMP14+EGFR - CBC with Differential/Platelet   Labs pending Health Maintenance reviewed Diet and exercise encouraged  Follow up plan: 3 months    [name], FNP

## 2020-12-20 NOTE — Patient Instructions (Signed)
[email or web address]">  Generalized Anxiety Disorder, Adult Generalized anxiety disorder (GAD) is a mental health condition. Unlike normal worries, anxiety related to GAD is not triggered by a specific event. These worries do not fade or get better with time. GAD interferes with relationships, work, and school. GAD symptoms can vary from mild to severe. People with severe GAD can have intense waves of anxiety with physical symptoms that are similar to panic attacks. What are the causes? The exact cause of GAD is not known, but the following are believed to have an impact:  Differences in natural brain chemicals.  Genes passed down from parents to children.  Differences in the way threats are perceived.  Development during childhood.  Personality. What increases the risk? The following factors may make you more likely to develop this condition:  Being female.  Having a family history of anxiety disorders.  Being very shy.  Experiencing very stressful life events, such as the death of a loved one.  Having a very stressful family environment. What are the signs or symptoms? People with GAD often worry excessively about many things in their lives, such as their health and family. Symptoms may also include:  Mental and emotional symptoms: ? Worrying excessively about natural disasters. ? Fear of being late. ? Difficulty concentrating. ? Fears that others are judging your performance.  Physical symptoms: ? Fatigue. ? Headaches, muscle tension, muscle twitches, trembling, or feeling shaky. ? Feeling like your heart is pounding or beating very fast. ? Feeling out of breath or like you cannot take a deep breath. ? Having trouble falling asleep or staying asleep, or experiencing restlessness. ? Sweating. ? Nausea, diarrhea, or irritable bowel syndrome (IBS).  Behavioral symptoms: ? Experiencing erratic moods or irritability. ? Avoidance of new situations. ? Avoidance of  people. ? Extreme difficulty making decisions. How is this diagnosed? This condition is diagnosed based on your symptoms and medical history. You will also have a physical exam. Your health care provider may perform tests to rule out other possible causes of your symptoms. To be diagnosed with GAD, a person must have anxiety that:  Is out of his or her control.  Affects several different aspects of his or her life, such as work and relationships.  Causes distress that makes him or her unable to take part in normal activities.  Includes at least three symptoms of GAD, such as restlessness, fatigue, trouble concentrating, irritability, muscle tension, or sleep problems. Before your health care provider can confirm a diagnosis of GAD, these symptoms must be present more days than they are not, and they must last for 6 months or longer. How is this treated? This condition may be treated with:  Medicine. Antidepressant medicine is usually prescribed for long-term daily control. Anti-anxiety medicines may be added in severe cases, especially when panic attacks occur.  Talk therapy (psychotherapy). Certain types of talk therapy can be helpful in treating GAD by providing support, education, and guidance. Options include: ? Cognitive behavioral therapy (CBT). People learn coping skills and self-calming techniques to ease their physical symptoms. They learn to identify unrealistic thoughts and behaviors and to replace them with more appropriate thoughts and behaviors. ? Acceptance and commitment therapy (ACT). This treatment teaches people how to be mindful as a way to cope with unwanted thoughts and feelings. ? Biofeedback. This process trains you to manage your body's response (physiological response) through breathing techniques and relaxation methods. You will work with a therapist while machines are used to monitor your physical   symptoms.  Stress management techniques. These include yoga,  meditation, and exercise. A mental health specialist can help determine which treatment is best for you. Some people see improvement with one type of therapy. However, other people require a combination of therapies.   Follow these instructions at home: Lifestyle  Maintain a consistent routine and schedule.  Anticipate stressful situations. Create a plan, and allow extra time to work with your plan.  Practice stress management or self-calming techniques that you have learned from your therapist or your health care provider. General instructions  Take over-the-counter and prescription medicines only as told by your health care provider.  Understand that you are likely to have setbacks. Accept this and be kind to yourself as you persist to take better care of yourself.  Recognize and accept your accomplishments, even if you judge them as small.  Keep all follow-up visits as told by your health care provider. This is important. Contact a health care provider if:  Your symptoms do not get better.  Your symptoms get worse.  You have signs of depression, such as: ? A persistently sad or irritable mood. ? Loss of enjoyment in activities that used to bring you joy. ? Change in weight or eating. ? Changes in sleeping habits. ? Avoiding friends or family members. ? Loss of energy for normal tasks. ? Feelings of guilt or worthlessness. Get help right away if:  You have serious thoughts about hurting yourself or others. If you ever feel like you may hurt yourself or others, or have thoughts about taking your own life, get help right away. Go to your nearest emergency department or:  Call your local emergency services (911 in the [place]  Call a suicide crisis helpline, such as the National Suicide Prevention Lifeline at 1-800-273-8255. This is open 24 hours a day in the [place]  Text the Crisis Text Line at 741741 (in the [place] Summary  Generalized anxiety disorder (GAD) is a mental  health condition that involves worry that is not triggered by a specific event.  People with GAD often worry excessively about many things in their lives, such as their health and family.  GAD may cause symptoms such as restlessness, trouble concentrating, sleep problems, frequent sweating, nausea, diarrhea, headaches, and trembling or muscle twitching.  A mental health specialist can help determine which treatment is best for you. Some people see improvement with one type of therapy. However, other people require a combination of therapies. This information is not intended to replace advice given to you by your health care provider. Make sure you discuss any questions you have with your health care provider. Document Revised: 06/10/2019 Document Reviewed: 06/10/2019 Elsevier Patient Education  2021 Elsevier Inc.  

## 2021-01-12 DIAGNOSIS — M542 Cervicalgia: Secondary | ICD-10-CM | POA: Diagnosis not present

## 2021-01-12 DIAGNOSIS — M25561 Pain in right knee: Secondary | ICD-10-CM | POA: Diagnosis not present

## 2021-01-12 DIAGNOSIS — R519 Headache, unspecified: Secondary | ICD-10-CM | POA: Diagnosis not present

## 2021-01-12 DIAGNOSIS — M25562 Pain in left knee: Secondary | ICD-10-CM | POA: Diagnosis not present

## 2021-01-12 DIAGNOSIS — E538 Deficiency of other specified B group vitamins: Secondary | ICD-10-CM | POA: Diagnosis not present

## 2021-01-19 ENCOUNTER — Other Ambulatory Visit: Payer: Self-pay | Admitting: Family

## 2021-01-26 ENCOUNTER — Other Ambulatory Visit: Payer: Self-pay | Admitting: Family

## 2021-01-26 DIAGNOSIS — I10 Essential (primary) hypertension: Secondary | ICD-10-CM

## 2021-01-31 DIAGNOSIS — M79622 Pain in left upper arm: Secondary | ICD-10-CM | POA: Diagnosis not present

## 2021-01-31 DIAGNOSIS — M722 Plantar fascial fibromatosis: Secondary | ICD-10-CM | POA: Diagnosis not present

## 2021-02-20 ENCOUNTER — Telehealth (INDEPENDENT_AMBULATORY_CARE_PROVIDER_SITE_OTHER): Payer: Medicare Other | Admitting: Nurse Practitioner

## 2021-02-20 DIAGNOSIS — R059 Cough, unspecified: Secondary | ICD-10-CM | POA: Diagnosis not present

## 2021-02-20 MED ORDER — DM-GUAIFENESIN ER 30-600 MG PO TB12: 1.0000 | ORAL_TABLET | Freq: Two times a day (BID) | ORAL | 0 refills | Status: DC

## 2021-02-20 MED ORDER — BENZONATATE 100 MG PO CAPS: 100.0000 mg | ORAL_CAPSULE | Freq: Three times a day (TID) | ORAL | 0 refills | Status: DC | PRN
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
## 2021-02-21 ENCOUNTER — Encounter: Payer: Self-pay | Admitting: Nurse Practitioner

## 2021-02-21 DIAGNOSIS — R059 Cough, unspecified: Secondary | ICD-10-CM | POA: Insufficient documentation

## 2021-02-21 LAB — NOVEL CORONAVIRUS, NAA: SARS-CoV-2, NAA: NOT DETECTED

## 2021-02-21 LAB — SARS-COV-2, NAA 2 DAY TAT

## 2021-02-21 NOTE — Assessment & Plan Note (Signed)
Symptoms mostly controlled.  COVID-19 test ordered,Benzonatate for cough, guaifenesin for cough and congestion.   education provided to patient with printed handouts given.  Rx sent to pharmacy.  Follow-up with worsening unresolved symptoms.

## 2021-03-09 DIAGNOSIS — M722 Plantar fascial fibromatosis: Secondary | ICD-10-CM | POA: Diagnosis not present

## 2021-03-09 DIAGNOSIS — M79671 Pain in right foot: Secondary | ICD-10-CM | POA: Diagnosis not present

## 2021-03-20 DIAGNOSIS — Z79891 Long term (current) use of opiate analgesic: Secondary | ICD-10-CM | POA: Diagnosis not present

## 2021-03-23 ENCOUNTER — Encounter: Payer: Self-pay | Admitting: Family

## 2021-03-23 ENCOUNTER — Other Ambulatory Visit: Payer: Self-pay | Admitting: Family

## 2021-03-23 ENCOUNTER — Other Ambulatory Visit: Payer: Self-pay

## 2021-03-23 ENCOUNTER — Ambulatory Visit (INDEPENDENT_AMBULATORY_CARE_PROVIDER_SITE_OTHER): Payer: Medicare Other | Admitting: Family

## 2021-03-23 VITALS — BP 142/75 | HR 67 | Temp 97.4°F | Ht 65.0 in | Wt 193.8 lb

## 2021-03-23 DIAGNOSIS — E039 Hypothyroidism, unspecified: Secondary | ICD-10-CM | POA: Diagnosis not present

## 2021-03-23 DIAGNOSIS — I10 Essential (primary) hypertension: Secondary | ICD-10-CM | POA: Diagnosis not present

## 2021-03-23 DIAGNOSIS — E785 Hyperlipidemia, unspecified: Secondary | ICD-10-CM

## 2021-03-23 DIAGNOSIS — F419 Anxiety disorder, unspecified: Secondary | ICD-10-CM | POA: Diagnosis not present

## 2021-03-23 DIAGNOSIS — K219 Gastro-esophageal reflux disease without esophagitis: Secondary | ICD-10-CM

## 2021-03-23 DIAGNOSIS — E559 Vitamin D deficiency, unspecified: Secondary | ICD-10-CM

## 2021-03-23 DIAGNOSIS — G959 Disease of spinal cord, unspecified: Secondary | ICD-10-CM

## 2021-03-23 DIAGNOSIS — F1320 Sedative, hypnotic or anxiolytic dependence, uncomplicated: Secondary | ICD-10-CM

## 2021-03-23 DIAGNOSIS — E669 Obesity, unspecified: Secondary | ICD-10-CM

## 2021-03-23 DIAGNOSIS — M542 Cervicalgia: Secondary | ICD-10-CM

## 2021-03-23 DIAGNOSIS — J449 Chronic obstructive pulmonary disease, unspecified: Secondary | ICD-10-CM | POA: Diagnosis not present

## 2021-03-23 DIAGNOSIS — G43109 Migraine with aura, not intractable, without status migrainosus: Secondary | ICD-10-CM

## 2021-03-23 DIAGNOSIS — Z79899 Other long term (current) drug therapy: Secondary | ICD-10-CM

## 2021-03-23 DIAGNOSIS — G8929 Other chronic pain: Secondary | ICD-10-CM

## 2021-03-23 DIAGNOSIS — M129 Arthropathy, unspecified: Secondary | ICD-10-CM

## 2021-03-23 DIAGNOSIS — F17200 Nicotine dependence, unspecified, uncomplicated: Secondary | ICD-10-CM

## 2021-03-23 DIAGNOSIS — F331 Major depressive disorder, recurrent, moderate: Secondary | ICD-10-CM

## 2021-03-23 DIAGNOSIS — K5900 Constipation, unspecified: Secondary | ICD-10-CM

## 2021-03-23 MED ORDER — CITALOPRAM HYDROBROMIDE 40 MG PO TABS: ORAL_TABLET | ORAL | 1 refills | Status: DC

## 2021-03-23 MED ORDER — CLONAZEPAM 1 MG PO TABS: ORAL_TABLET | ORAL | 2 refills | Status: DC

## 2021-03-23 NOTE — Patient Instructions (Signed)

## 2021-03-23 NOTE — Progress Notes (Signed)
Subjective:    Patient ID: Elizabeth Brewer, female    DOB: 1961-07-16, 60 y.o.   MRN: [id number]  Chief Complaint  Patient presents with   Medical Management of Chronic Issues   PT presents to  the office today for chronic follow up. PT is followed by a neurologists every 3 months for chronic neck pain,seizure, and migraines. These are stable at this time.    She states her brother died in 2022/10/30 and this has been really hard on her. Hypertension This is a chronic problem. The current episode started more than 1 year ago. The problem has been waxing and waning since onset. The problem is uncontrolled. Associated symptoms include anxiety, malaise/fatigue, neck pain and palpitations. Pertinent negatives include no peripheral edema or shortness of breath. Risk factors for coronary artery disease include dyslipidemia, obesity and sedentary lifestyle. The current treatment provides moderate improvement. Identifiable causes of hypertension include a thyroid problem.  Gastroesophageal Reflux She complains of belching, heartburn and a hoarse voice. This is a chronic problem. The current episode started more than 1 year ago. The problem occurs occasionally. The symptoms are aggravated by smoking and certain foods. Associated symptoms include fatigue. Risk factors include obesity. She has tried a PPI for the symptoms. The treatment provided moderate relief.  Thyroid Problem Presents for follow-up visit. Symptoms include anxiety, depressed mood, fatigue, hoarse voice and palpitations. The symptoms have been stable.  Neck Pain  This is a chronic problem. The current episode started more than 1 year ago. The problem occurs intermittently. The pain is at a severity of 8/10. The pain is moderate.  Nicotine Dependence Presents for follow-up visit. Symptoms include decreased concentration, fatigue and irritability. Her urge triggers include company of smokers. The symptoms have been stable. She smokes < 1/2  a pack of cigarettes per day.  Depression        This is a chronic problem.  The current episode started more than 1 year ago.   The onset quality is gradual.   Associated symptoms include decreased concentration, fatigue, helplessness, hopelessness, irritable, restlessness and sad.  Past medical history includes thyroid problem and anxiety.   Anxiety Presents for follow-up visit. Symptoms include decreased concentration, depressed mood, excessive worry, irritability, nervous/anxious behavior, palpitations and restlessness. Patient reports no shortness of breath. Symptoms occur most days.    COPD Continues to smoke a 1/2 pack a day. Has SOB at times.    Review of Systems  Constitutional:  Positive for fatigue, irritability and malaise/fatigue.  HENT:  Positive for hoarse voice.   Respiratory:  Negative for shortness of breath.   Cardiovascular:  Positive for palpitations.  Gastrointestinal:  Positive for heartburn.  Musculoskeletal:  Positive for neck pain.  Psychiatric/Behavioral:  Positive for decreased concentration and depression. The patient is nervous/anxious.   All other systems reviewed and are negative.     Objective:   Physical Exam Vitals reviewed.  Constitutional:      General: She is irritable. She is not in acute distress.    Appearance: She is well-developed. She is obese.  HENT:     Head: Normocephalic and atraumatic.     Right Ear: Tympanic membrane normal.     Left Ear: Tympanic membrane normal.  Eyes:     Pupils: Pupils are equal, round, and reactive to light.  Neck:     Thyroid: No thyromegaly.  Cardiovascular:     Rate and Rhythm: Normal rate and regular rhythm.     Heart sounds: Normal heart  sounds. No murmur heard. Pulmonary:     Effort: Pulmonary effort is normal. No respiratory distress.     Breath sounds: Normal breath sounds. No wheezing.  Abdominal:     General: Bowel sounds are normal. There is no distension.     Palpations: Abdomen is soft.      Tenderness: There is no abdominal tenderness.  Musculoskeletal:        General: No tenderness. Normal range of motion.     Cervical back: Normal range of motion and neck supple.  Skin:    General: Skin is warm and dry.  Neurological:     Mental Status: She is alert and oriented to person, place, and time.     Cranial Nerves: No cranial nerve deficit.     Deep Tendon Reflexes: Reflexes are normal and symmetric.  Psychiatric:        Behavior: Behavior normal.        Thought Content: Thought content normal.        Judgment: Judgment normal.      BP (!) 142/75   Pulse 67   Temp (!) 97.4 F (36.3 C) (Temporal)   Ht $R'5\' 5"'eE$  (1.651 m)   Wt 193 lb 12.8 oz (87.9 kg)   BMI 32.25 kg/m      Assessment & Plan:  Ashlynne H Boudreau comes in today with chief complaint of Medical Management of Chronic Issues   Diagnosis and orders addressed:  1. Anxiety - citalopram (CELEXA) 40 MG tablet; TAKE ONE (1) TABLET EACH DAY  Dispense: 90 tablet; Refill: 1 - clonazePAM (KLONOPIN) 1 MG tablet; TAKE 1 TABLET DAILY AS NEEDED FOR ANXIETY  Dispense: 30 tablet; Refill: 2 - CMP14+EGFR - CBC with Differential/Platelet - ToxASSURE Select 13 (MW), Urine - Ambulatory referral to Psychiatry  2. Benzodiazepine dependence (HCC) - citalopram (CELEXA) 40 MG tablet; TAKE ONE (1) TABLET EACH DAY  Dispense: 90 tablet; Refill: 1 - clonazePAM (KLONOPIN) 1 MG tablet; TAKE 1 TABLET DAILY AS NEEDED FOR ANXIETY  Dispense: 30 tablet; Refill: 2 - CMP14+EGFR - CBC with Differential/Platelet - ToxASSURE Select 13 (MW), Urine - Ambulatory referral to Psychiatry  3. Controlled substance agreement signed - clonazePAM (KLONOPIN) 1 MG tablet; TAKE 1 TABLET DAILY AS NEEDED FOR ANXIETY  Dispense: 30 tablet; Refill: 2 - CMP14+EGFR - CBC with Differential/Platelet - ToxASSURE Select 13 (MW), Urine  4. Essential hypertension - CMP14+EGFR - CBC with Differential/Platelet  5. Migraine with aura and without status  migrainosus, not intractable - CMP14+EGFR - CBC with Differential/Platelet  6. Chronic obstructive pulmonary disease, unspecified COPD type ([place]) - CMP14+EGFR - CBC with Differential/Platelet  7. Gastroesophageal reflux disease, unspecified whether esophagitis present - CMP14+EGFR - CBC with Differential/Platelet  8. Hypothyroidism, unspecified type - CMP14+EGFR - CBC with Differential/Platelet - TSH  9. Cervical myelopathy (HCC) - CMP14+EGFR - CBC with Differential/Platelet  10. Arthropathy - CMP14+EGFR - CBC with Differential/Platelet  11. Chronic neck pain - CMP14+EGFR - CBC with Differential/Platelet  12. Vitamin D deficiency - CMP14+EGFR - CBC with Differential/Platelet  13. Obesity (BMI 30-39.9) - CMP14+EGFR - CBC with Differential/Platelet  14. Constipation, unspecified constipation type - CMP14+EGFR - CBC with Differential/Platelet  15. Hyperlipidemia, unspecified hyperlipidemia type - CMP14+EGFR - CBC with Differential/Platelet  16. TOBACCO ABUSE - CMP14+EGFR - CBC with Differential/Platelet  17. Moderate episode of recurrent major depressive disorder (HCC) - CMP14+EGFR - CBC with Differential/Platelet - Ambulatory referral to Psychiatry   Labs pending Health Maintenance reviewed Diet and exercise encouraged  Follow up plan: 3  months    [name], FNP

## 2021-03-24 LAB — CBC WITH DIFFERENTIAL/PLATELET
Basophils Absolute: 0.1 10*3/uL (ref 0.0–0.2)
Basos: 1 %
EOS (ABSOLUTE): 0.5 10*3/uL — ABNORMAL HIGH (ref 0.0–0.4)
Eos: 5 %
Hematocrit: 42.9 % (ref 34.0–46.6)
Hemoglobin: 14.1 g/dL (ref 11.1–15.9)
Immature Grans (Abs): 0 10*3/uL (ref 0.0–0.1)
Immature Granulocytes: 0 %
Lymphocytes Absolute: 2.2 10*3/uL (ref 0.7–3.1)
Lymphs: 23 %
MCH: 32 pg (ref 26.6–33.0)
MCHC: 32.9 g/dL (ref 31.5–35.7)
MCV: 98 fL — ABNORMAL HIGH (ref 79–97)
Monocytes Absolute: 0.6 10*3/uL (ref 0.1–0.9)
Monocytes: 6 %
Neutrophils Absolute: 6.4 10*3/uL (ref 1.4–7.0)
Neutrophils: 65 %
Platelets: 257 10*3/uL (ref 150–450)
RBC: 4.4 x10E6/uL (ref 3.77–5.28)
RDW: 13.1 % (ref 11.7–15.4)
WBC: 9.8 10*3/uL (ref 3.4–10.8)

## 2021-03-24 LAB — CMP14+EGFR
ALT: 18 IU/L (ref 0–32)
AST: 19 IU/L (ref 0–40)
Albumin/Globulin Ratio: 2.3 — ABNORMAL HIGH (ref 1.2–2.2)
Albumin: 4.3 g/dL (ref 3.8–4.9)
Alkaline Phosphatase: 92 IU/L (ref 44–121)
BUN/Creatinine Ratio: 11 (ref 9–23)
BUN: 9 mg/dL (ref 6–24)
Bilirubin Total: 0.3 mg/dL (ref 0.0–1.2)
CO2: 28 mmol/L (ref 20–29)
Calcium: 8.9 mg/dL (ref 8.7–10.2)
Chloride: 94 mmol/L — ABNORMAL LOW (ref 96–106)
Creatinine, Ser: 0.8 mg/dL (ref 0.57–1.00)
Globulin, Total: 1.9 g/dL (ref 1.5–4.5)
Glucose: 104 mg/dL — ABNORMAL HIGH (ref 65–99)
Potassium: 3.4 mmol/L — ABNORMAL LOW (ref 3.5–5.2)
Sodium: 137 mmol/L (ref 134–144)
Total Protein: 6.2 g/dL (ref 6.0–8.5)
eGFR: 85 mL/min/{1.73_m2} (ref >59)

## 2021-03-24 LAB — TSH: TSH: 4.43 u[IU]/mL (ref 0.450–4.500)

## 2021-03-27 ENCOUNTER — Other Ambulatory Visit: Payer: Self-pay | Admitting: *Deleted

## 2021-03-27 DIAGNOSIS — R059 Cough, unspecified: Secondary | ICD-10-CM

## 2021-03-27 LAB — TOXASSURE SELECT 13 (MW), URINE

## 2021-03-29 ENCOUNTER — Other Ambulatory Visit: Payer: Self-pay | Admitting: Family Medicine

## 2021-03-29 ENCOUNTER — Telehealth: Payer: Self-pay | Admitting: Family

## 2021-03-29 ENCOUNTER — Other Ambulatory Visit: Payer: Self-pay | Admitting: Family

## 2021-03-29 MED ORDER — BENZONATATE 200 MG PO CAPS: 200.0000 mg | ORAL_CAPSULE | Freq: Two times a day (BID) | ORAL | 0 refills | Status: DC | PRN

## 2021-03-29 MED ORDER — POTASSIUM CHLORIDE CRYS ER 20 MEQ PO TBCR: 20.0000 meq | EXTENDED_RELEASE_TABLET | Freq: Two times a day (BID) | ORAL | 2 refills | Status: DC

## 2021-03-29 NOTE — Telephone Encounter (Signed)
Patient aware and verbalized understanding. °

## 2021-03-29 NOTE — Telephone Encounter (Signed)
Per [place] okay to call in.

## 2021-04-14 ENCOUNTER — Other Ambulatory Visit: Payer: Self-pay | Admitting: Family

## 2021-04-20 ENCOUNTER — Other Ambulatory Visit: Payer: Self-pay | Admitting: Family

## 2021-04-20 DIAGNOSIS — I10 Essential (primary) hypertension: Secondary | ICD-10-CM

## 2021-05-10 DIAGNOSIS — E538 Deficiency of other specified B group vitamins: Secondary | ICD-10-CM | POA: Diagnosis not present

## 2021-05-10 DIAGNOSIS — M542 Cervicalgia: Secondary | ICD-10-CM | POA: Diagnosis not present

## 2021-05-10 DIAGNOSIS — G43109 Migraine with aura, not intractable, without status migrainosus: Secondary | ICD-10-CM | POA: Diagnosis not present

## 2021-05-10 DIAGNOSIS — Z79899 Other long term (current) drug therapy: Secondary | ICD-10-CM | POA: Diagnosis not present

## 2021-06-26 ENCOUNTER — Ambulatory Visit (INDEPENDENT_AMBULATORY_CARE_PROVIDER_SITE_OTHER): Payer: Medicare Other | Admitting: Family

## 2021-06-26 ENCOUNTER — Encounter: Payer: Self-pay | Admitting: Family

## 2021-06-26 ENCOUNTER — Other Ambulatory Visit: Payer: Self-pay

## 2021-06-26 ENCOUNTER — Telehealth: Payer: Self-pay | Admitting: Family

## 2021-06-26 VITALS — BP 128/79 | HR 68 | Temp 98.0°F | Ht 65.0 in | Wt 195.6 lb

## 2021-06-26 DIAGNOSIS — E876 Hypokalemia: Secondary | ICD-10-CM | POA: Diagnosis not present

## 2021-06-26 DIAGNOSIS — F419 Anxiety disorder, unspecified: Secondary | ICD-10-CM

## 2021-06-26 DIAGNOSIS — Z23 Encounter for immunization: Secondary | ICD-10-CM | POA: Diagnosis not present

## 2021-06-26 DIAGNOSIS — F1320 Sedative, hypnotic or anxiolytic dependence, uncomplicated: Secondary | ICD-10-CM

## 2021-06-26 DIAGNOSIS — J449 Chronic obstructive pulmonary disease, unspecified: Secondary | ICD-10-CM

## 2021-06-26 DIAGNOSIS — I10 Essential (primary) hypertension: Secondary | ICD-10-CM | POA: Diagnosis not present

## 2021-06-26 DIAGNOSIS — Z79899 Other long term (current) drug therapy: Secondary | ICD-10-CM | POA: Diagnosis not present

## 2021-06-26 DIAGNOSIS — K219 Gastro-esophageal reflux disease without esophagitis: Secondary | ICD-10-CM | POA: Diagnosis not present

## 2021-06-26 DIAGNOSIS — E039 Hypothyroidism, unspecified: Secondary | ICD-10-CM | POA: Diagnosis not present

## 2021-06-26 MED ORDER — FLUTICASONE FUROATE-VILANTEROL 100-25 MCG/ACT IN AEPB: 1.0000 | INHALATION_SPRAY | Freq: Every day | RESPIRATORY_TRACT | 11 refills | Status: DC

## 2021-06-26 MED ORDER — CLONAZEPAM 1 MG PO TABS: ORAL_TABLET | ORAL | 2 refills | Status: DC

## 2021-06-26 MED ORDER — BENZONATATE 200 MG PO CAPS: 200.0000 mg | ORAL_CAPSULE | Freq: Two times a day (BID) | ORAL | 2 refills | Status: DC | PRN

## 2021-06-26 MED ORDER — POTASSIUM CHLORIDE CRYS ER 20 MEQ PO TBCR: 20.0000 meq | EXTENDED_RELEASE_TABLET | Freq: Two times a day (BID) | ORAL | 2 refills | Status: DC

## 2021-06-26 NOTE — Progress Notes (Signed)
Subjective:    Patient ID: Elizabeth Brewer, female    DOB: [birth year], 60 y.o.   MRN: [id number]  Chief Complaint  Patient presents with   Medical Management of Chronic Issues   PT presents to  the office today for chronic follow up. PT is followed by a neurologists every 3 months for chronic neck pain,seizure, and migraines. These are stable at this time.   She is followed by [facility].    She states her brother died in November 05, 2022 and this has been really hard on her. She reports her husband shot himself last week.  Hypertension This is a chronic problem. The current episode started more than 1 year ago. The problem has been resolved since onset. The problem is controlled. Associated symptoms include anxiety and malaise/fatigue. Pertinent negatives include no neck pain, peripheral edema or shortness of breath. Risk factors for coronary artery disease include dyslipidemia and obesity. The current treatment provides moderate improvement. Identifiable causes of hypertension include a thyroid problem.  Gastroesophageal Reflux She complains of belching, heartburn and a hoarse voice. This is a chronic problem. The current episode started more than 1 year ago. The problem occurs occasionally. Associated symptoms include fatigue. Risk factors include obesity. She has tried a PPI for the symptoms. The treatment provided moderate relief.  Thyroid Problem Presents for follow-up visit. Symptoms include anxiety, depressed mood, fatigue and hoarse voice. The symptoms have been stable. Her past medical history is significant for hyperlipidemia.  Nicotine Dependence Presents for follow-up visit. Symptoms include fatigue, insomnia and irritability. Her urge triggers include company of smokers. The symptoms have been stable. She smokes 1 pack of cigarettes per day.  Hyperlipidemia This is a chronic problem. The current episode started more than 1 year ago. Exacerbating diseases include obesity.  Pertinent negatives include no shortness of breath. Current antihyperlipidemic treatment includes statins. The current treatment provides moderate improvement of lipids. Risk factors for coronary artery disease include dyslipidemia, hypertension, a sedentary lifestyle and post-menopausal.  Depression        This is a chronic problem.  The current episode started more than 1 year ago.   The onset quality is gradual.   The problem occurs intermittently.  Associated symptoms include fatigue, helplessness, hopelessness, insomnia, irritable, restlessness, decreased interest and sad.  Past treatments include SSRIs - Selective serotonin reuptake inhibitors.  Past medical history includes thyroid problem and anxiety.   Anxiety Presents for follow-up visit. Symptoms include depressed mood, excessive worry, insomnia, irritability, nervous/anxious behavior and restlessness. Patient reports no shortness of breath. Symptoms occur most days.    COPD  Continues to smoke a pack a day. Has SOB.    Review of Systems  Constitutional:  Positive for fatigue, irritability and malaise/fatigue.  HENT:  Positive for hoarse voice.   Respiratory:  Negative for shortness of breath.   Gastrointestinal:  Positive for heartburn.  Musculoskeletal:  Negative for neck pain.  Psychiatric/Behavioral:  Positive for depression. The patient is nervous/anxious and has insomnia.   All other systems reviewed and are negative.     Objective:   Physical Exam Vitals reviewed.  Constitutional:      General: She is irritable. She is not in acute distress.    Appearance: She is well-developed. She is obese.  HENT:     Head: Normocephalic and atraumatic.     Right Ear: Tympanic membrane normal.     Left Ear: Tympanic membrane normal.  Eyes:     Pupils: Pupils are equal, round, and reactive  to light.  Neck:     Thyroid: No thyromegaly.  Cardiovascular:     Rate and Rhythm: Normal rate and regular rhythm.     Heart sounds: Normal  heart sounds. No murmur heard. Pulmonary:     Effort: Pulmonary effort is normal. No respiratory distress.     Breath sounds: Normal breath sounds. No wheezing.  Abdominal:     General: Bowel sounds are normal. There is no distension.     Palpations: Abdomen is soft.     Tenderness: There is no abdominal tenderness.  Musculoskeletal:        General: No tenderness. Normal range of motion.     Cervical back: Normal range of motion and neck supple.  Skin:    General: Skin is warm and dry.  Neurological:     Mental Status: She is alert and oriented to person, place, and time.     Cranial Nerves: No cranial nerve deficit.     Deep Tendon Reflexes: Reflexes are normal and symmetric.  Psychiatric:        Mood and Affect: Mood is depressed. Affect is tearful.        Behavior: Behavior normal.        Thought Content: Thought content normal.        Judgment: Judgment normal.     Comments: Crying        BP 128/79   Pulse 68   Temp 98 F (36.7 C) (Temporal)   Ht _0  (1.651 m)   Wt 195 lb 9.6 oz (88.7 kg)   BMI 32.55 kg/m   Assessment & Plan:  Elizabeth Brewer comes in today with chief complaint of Medical Management of Chronic Issues   Diagnosis and orders addressed:  1. Need for immunization against influenza - Flu Vaccine QUAD 22moIM (Fluarix, Fluzone & Alfiuria Quad PF) - CMP14+EGFR  2. Essential hypertension - CMP14+EGFR  3. Chronic obstructive pulmonary disease, unspecified COPD type (HCC) - fluticasone furoate-vilanterol (BREO ELLIPTA) 100-25 MCG/ACT AEPB; Inhale 1 puff into the lungs daily.  Dispense: 1 each; Refill: 11 - CMP14+EGFR  4. Gastroesophageal reflux disease, unspecified whether esophagitis present - CMP14+EGFR  5. Hypothyroidism, unspecified type - CMP14+EGFR  6. Anxiety - clonazePAM (KLONOPIN) 1 MG tablet; TAKE 1 TABLET DAILY AS NEEDED FOR ANXIETY  Dispense: 30 tablet; Refill: 2 - CMP14+EGFR  7. Benzodiazepine dependence (HCC) - clonazePAM  (KLONOPIN) 1 MG tablet; TAKE 1 TABLET DAILY AS NEEDED FOR ANXIETY  Dispense: 30 tablet; Refill: 2 - CMP14+EGFR  8. Controlled substance agreement signed - clonazePAM (KLONOPIN) 1 MG tablet; TAKE 1 TABLET DAILY AS NEEDED FOR ANXIETY  Dispense: 30 tablet; Refill: 2 - CMP14+EGFR  9. Hypokalemia - potassium chloride SA (KLOR-CON) 20 MEQ tablet; Take 1 tablet (20 mEq total) by mouth 2 (two) times daily.  Dispense: 60 tablet; Refill: 2 - CMP14+EGFR   Labs pending Patient reviewed in [place] controlled database, no flags noted. Contract and drug screen are up to date.  Health Maintenance reviewed Diet and exercise encouraged  Follow up plan: 3 months    CEvelina Dun FNP

## 2021-06-26 NOTE — Telephone Encounter (Signed)
Prescription sent to pharmacy.

## 2021-06-26 NOTE — Patient Instructions (Signed)
Managing Loss, Adult People experience loss in many different ways throughout their lives. Events such as moving, changing jobs, and losing friends can create a sense of loss. The loss may be as serious as a major health change, divorce, death of a pet, or death of a loved one. All of these types of loss are likely to create a physical and emotional reaction known as grief. Grief is the result of a major change or an absence of something or someone that you count on. Grief is a normal reaction to loss. A variety of factors can affect your grieving experience, including: The nature of your loss. Your relationship to what or whom you lost. Your understanding of grief and how to manage it. Your support system. How to manage lifestyle changes Keep to your normal routine as much as possible. If you have trouble focusing or doing normal activities, it is acceptable to take some time away from your normal routine. Spend time with friends and loved ones. Eat a healthy diet, get plenty of sleep, and rest when you feel tired. How to recognize changes  The way that you deal with your grief will affect your ability to function as you normally do. When grieving, you may experience these changes: Numbness, shock, sadness, anxiety, anger, denial, and guilt. Thoughts about death. Unexpected crying. A physical sensation of emptiness in your stomach. Problems sleeping and eating. Tiredness (fatigue). Loss of interest in normal activities. Dreaming about or imagining seeing the person who died. A need to remember what or whom you lost. Difficulty thinking about anything other than your loss for a period of time. Relief. If you have been expecting the loss for a while, you may feel a sense of relief when it happens. Follow these instructions at home: Activity Express your feelings in healthy ways, such as: Talking with others about your loss. It may be helpful to find others who have had a similar loss, such  as a support group. Writing down your feelings in a journal. Doing physical activities to release stress and emotional energy. Doing creative activities like painting, sculpting, or playing or listening to music. Practicing resilience. This is the ability to recover and adjust after facing challenges. Reading some resources that encourage resilience may help you to learn ways to practice those behaviors.  General instructions Be patient with yourself and others. Allow the grieving process to happen, and remember that grieving takes time. It is likely that you may never feel completely done with some grief. You may find a way to move on while still cherishing memories and feelings about your loss. Accepting your loss is a process. It can take months or longer to adjust. Keep all follow-up visits as told by your health care provider. This is important. Where to find support To get support for managing loss: Ask your health care provider for help and recommendations, such as grief counseling or therapy. Think about joining a support group for people who are managing a loss. Where to find more information You can find more information about managing loss from: American Society of Clinical Oncology: www.cancer.net American Psychological Association: www.apa.org Contact a health care provider if: Your grief is extreme and keeps getting worse. You have ongoing grief that does not improve. Your body shows symptoms of grief, such as illness. You feel depressed, anxious, or lonely. Get help right away if: You have thoughts about hurting yourself or others. If you ever feel like you may hurt yourself or others, or have   thoughts about taking your own life, get help right away. You can go to your nearest emergency department or call: Your local emergency services (911 in the [place] A suicide crisis helpline, such as the National Suicide Prevention Lifeline at 1-800-273-8255. This is open 24 hours a  day. Summary Grief is the result of a major change or an absence of someone or something that you count on. Grief is a normal reaction to loss. The depth of grief and the period of recovery depend on the type of loss and your ability to adjust to the change and process your feelings. Processing grief requires patience and a willingness to accept your feelings and talk about your loss with people who are supportive. It is important to find resources that work for you and to realize that people experience grief differently. There is not one grieving process that works for everyone in the same way. Be aware that when grief becomes extreme, it can lead to more severe issues like isolation, depression, anxiety, or suicidal thoughts. Talk with your health care provider if you have any of these issues. This information is not intended to replace advice given to you by your health care provider. Make sure you discuss any questions you have with your health care provider. Document Revised: 02/11/2020 Document Reviewed: 02/11/2020 Elsevier Patient Education  2022 Elsevier Inc.  

## 2021-06-27 LAB — CMP14+EGFR
ALT: 11 IU/L (ref 0–32)
AST: 14 IU/L (ref 0–40)
Albumin/Globulin Ratio: 2.2 (ref 1.2–2.2)
Albumin: 4.4 g/dL (ref 3.8–4.9)
Alkaline Phosphatase: 85 IU/L (ref 44–121)
BUN/Creatinine Ratio: 12 (ref 12–28)
BUN: 8 mg/dL (ref 8–27)
Bilirubin Total: <0.2 mg/dL (ref 0.0–1.2)
CO2: 25 mmol/L (ref 20–29)
Calcium: 9.2 mg/dL (ref 8.7–10.3)
Chloride: 99 mmol/L (ref 96–106)
Creatinine, Ser: 0.66 mg/dL (ref 0.57–1.00)
Globulin, Total: 2 g/dL (ref 1.5–4.5)
Glucose: 104 mg/dL — ABNORMAL HIGH (ref 70–99)
Potassium: 3.4 mmol/L — ABNORMAL LOW (ref 3.5–5.2)
Sodium: 138 mmol/L (ref 134–144)
Total Protein: 6.4 g/dL (ref 6.0–8.5)
eGFR: 100 mL/min/{1.73_m2} (ref >59)

## 2021-07-05 ENCOUNTER — Other Ambulatory Visit: Payer: Self-pay | Admitting: Family

## 2021-07-11 ENCOUNTER — Other Ambulatory Visit: Payer: Self-pay | Admitting: Family

## 2021-07-11 DIAGNOSIS — I10 Essential (primary) hypertension: Secondary | ICD-10-CM

## 2021-07-24 ENCOUNTER — Other Ambulatory Visit: Payer: Self-pay | Admitting: Family

## 2021-08-09 DIAGNOSIS — M545 Low back pain, unspecified: Secondary | ICD-10-CM | POA: Diagnosis not present

## 2021-08-09 DIAGNOSIS — Z79899 Other long term (current) drug therapy: Secondary | ICD-10-CM | POA: Diagnosis not present

## 2021-08-09 DIAGNOSIS — G43109 Migraine with aura, not intractable, without status migrainosus: Secondary | ICD-10-CM | POA: Diagnosis not present
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
## 2021-09-18 ENCOUNTER — Encounter: Payer: Self-pay | Admitting: Internal Medicine

## 2021-09-18 ENCOUNTER — Ambulatory Visit: Payer: Medicare Other | Admitting: Gastroenterology

## 2021-09-19 ENCOUNTER — Encounter: Payer: Self-pay | Admitting: Family

## 2021-09-19 ENCOUNTER — Ambulatory Visit (INDEPENDENT_AMBULATORY_CARE_PROVIDER_SITE_OTHER): Payer: Medicare Other | Admitting: Family

## 2021-09-19 VITALS — BP 124/86 | HR 87 | Temp 98.0°F | Ht 65.0 in | Wt 215.0 lb

## 2021-09-19 DIAGNOSIS — J449 Chronic obstructive pulmonary disease, unspecified: Secondary | ICD-10-CM | POA: Diagnosis not present

## 2021-09-19 DIAGNOSIS — I10 Essential (primary) hypertension: Secondary | ICD-10-CM

## 2021-09-19 DIAGNOSIS — F419 Anxiety disorder, unspecified: Secondary | ICD-10-CM

## 2021-09-19 DIAGNOSIS — R600 Localized edema: Secondary | ICD-10-CM | POA: Diagnosis not present

## 2021-09-19 DIAGNOSIS — W19XXXA Unspecified fall, initial encounter: Secondary | ICD-10-CM | POA: Diagnosis not present

## 2021-09-19 DIAGNOSIS — K219 Gastro-esophageal reflux disease without esophagitis: Secondary | ICD-10-CM

## 2021-09-19 DIAGNOSIS — E039 Hypothyroidism, unspecified: Secondary | ICD-10-CM | POA: Diagnosis not present

## 2021-09-19 DIAGNOSIS — S0003XA Contusion of scalp, initial encounter: Secondary | ICD-10-CM

## 2021-09-19 DIAGNOSIS — F17200 Nicotine dependence, unspecified, uncomplicated: Secondary | ICD-10-CM

## 2021-09-19 DIAGNOSIS — F1320 Sedative, hypnotic or anxiolytic dependence, uncomplicated: Secondary | ICD-10-CM

## 2021-09-19 DIAGNOSIS — Z23 Encounter for immunization: Secondary | ICD-10-CM

## 2021-09-19 MED ORDER — FOLIC ACID 1 MG PO TABS: 3.0000 mg | ORAL_TABLET | Freq: Every day | ORAL | 1 refills | Status: DC

## 2021-09-19 MED ORDER — LEVOTHYROXINE SODIUM 100 MCG PO TABS: 100.0000 ug | ORAL_TABLET | Freq: Every day | ORAL | 3 refills | Status: DC

## 2021-09-19 MED ORDER — CITALOPRAM HYDROBROMIDE 40 MG PO TABS: ORAL_TABLET | ORAL | 1 refills | Status: DC

## 2021-09-19 MED ORDER — HYDROCHLOROTHIAZIDE 12.5 MG PO CAPS: 12.5000 mg | ORAL_CAPSULE | Freq: Every day | ORAL | 0 refills | Status: DC

## 2021-09-19 MED ORDER — FLUTICASONE FUROATE-VILANTEROL 100-25 MCG/ACT IN AEPB: 1.0000 | INHALATION_SPRAY | Freq: Every day | RESPIRATORY_TRACT | 11 refills | Status: DC

## 2021-09-19 MED ORDER — ROSUVASTATIN CALCIUM 20 MG PO TABS: 20.0000 mg | ORAL_TABLET | Freq: Every evening | ORAL | 0 refills | Status: DC

## 2021-09-19 MED ORDER — FUROSEMIDE 20 MG PO TABS: 20.0000 mg | ORAL_TABLET | Freq: Every day | ORAL | 0 refills | Status: DC

## 2021-09-19 MED ORDER — MELOXICAM 15 MG PO TABS: 15.0000 mg | ORAL_TABLET | Freq: Every day | ORAL | 1 refills | Status: DC

## 2021-09-19 MED ORDER — OMEPRAZOLE 20 MG PO CPDR: 20.0000 mg | DELAYED_RELEASE_CAPSULE | Freq: Every day | ORAL | 0 refills | Status: DC

## 2021-09-19 NOTE — Patient Instructions (Signed)
Contusion A contusion is a deep bruise. Contusions are the result of a blunt injury to tissues and muscle fibers under the skin. The injury causes bleeding under the skin. The skin overlying the contusion may turn blue, purple, or yellow. Minor injuries will give you a painless contusion, but more severe injuries cause contusions that may stay painful and swollen for a few weeks. Follow these instructions at home: Pay attention to any changes in your symptoms. Let your health care provider know about them. Take these actions to relieve your pain. Managing pain, stiffness, and swelling  Use resting, icing, applying pressure (compression), and raising (elevating) the injured area. This is often called the RICE strategy. Rest the injured area. Return to your normal activities as told by your health care provider. Ask your health care provider what activities are safe for you. If directed, put ice on the injured area: Put ice in a plastic bag. Place a towel between your skin and the bag. Leave the ice on for 20 minutes, 2-3 times per day. If directed, apply light compression to the injured area using an elastic bandage. Make sure the bandage is not wrapped too tightly. Remove and reapply the bandage as directed by your health care provider. If possible, raise (elevate) the injured area above the level of your heart while you are sitting or lying down. General instructions Take over-the-counter and prescription medicines only as told by your health care provider. Keep all follow-up visits as told by your health care provider. This is important. Contact a health care provider if: Your symptoms do not improve after several days of treatment. Your symptoms get worse. You have difficulty moving the injured area. Get help right away if: You have severe pain. You have numbness in a hand or foot. Your hand or foot turns pale or cold. Summary A contusion is a deep bruise. Contusions are the result of a  blunt injury to tissues and muscle fibers under the skin. It is treated with rest, ice, compression, and elevation. You may be given over-the-counter medicines for pain. Contact a health care provider if your symptoms do not improve, or get worse. Get help right away if you have severe pain, have numbness, or the area turns pale or cold. This information is not intended to replace advice given to you by your health care provider. Make sure you discuss any questions you have with your health care provider. Document Revised: 04/10/2018 Document Reviewed: 04/10/2018 Elsevier Patient Education  [place].

## 2021-09-19 NOTE — Progress Notes (Signed)
Subjective:    Patient ID: Elizabeth Brewer, female    DOB: Aug 11, 1961, 61 y.o.   MRN: [id number]  Chief Complaint  Patient presents with   Fall   PT presents to  the office today after a fall. She reports last Friday she was walking into a store and was stepping up on a curb and fell and hit her knees and forehead.   She reports she went to [facility] during Christmas time and all her medications were lost.  Fall The accident occurred 3 to 5 days ago. She landed on [place]crete. The volume of blood lost was minimal. The point of impact was the face, left knee and right knee. The pain is at a severity of 7/10. The pain is mild. The symptoms are aggravated by movement. Pertinent negatives include no fever, headaches, hearing loss, hematuria, loss of consciousness, nausea, numbness, tingling, visual change or vomiting. She has tried acetaminophen, rest and ice (oxycodone) for the symptoms. The treatment provided mild relief.  Insomnia Primary symptoms: difficulty falling asleep, frequent awakening, malaise/fatigue.   The current episode started more than one year. The onset quality is gradual. The problem occurs intermittently.  Hypertension This is a chronic problem. The current episode started more than 1 year ago. The problem has been resolved since onset. The problem is controlled. Associated symptoms include malaise/fatigue. Pertinent negatives include no headaches, peripheral edema or shortness of breath. Risk factors for coronary artery disease include dyslipidemia, obesity and sedentary lifestyle. The current treatment provides moderate improvement.     Review of Systems  Constitutional:  Positive for malaise/fatigue. Negative for fever.  Respiratory:  Negative for shortness of breath.   Gastrointestinal:  Negative for nausea and vomiting.  Genitourinary:  Negative for hematuria.  Neurological:  Negative for tingling, loss of consciousness, numbness and headaches.  Psychiatric/Behavioral:   The patient has insomnia.   All other systems reviewed and are negative.     Objective:   Physical Exam Vitals reviewed.  Constitutional:      General: She is not in acute distress.    Appearance: She is well-developed. She is obese.  HENT:     Head: Normocephalic and atraumatic.  Eyes:     Pupils: Pupils are equal, round, and reactive to light.  Neck:     Thyroid: No thyromegaly.  Cardiovascular:     Rate and Rhythm: Normal rate and regular rhythm.     Heart sounds: Normal heart sounds. No murmur heard. Pulmonary:     Effort: Pulmonary effort is normal. No respiratory distress.     Breath sounds: Normal breath sounds. No wheezing.  Abdominal:     General: Bowel sounds are normal. There is no distension.     Palpations: Abdomen is soft.     Tenderness: There is no abdominal tenderness.  Musculoskeletal:        General: No tenderness. Normal range of motion.     Cervical back: Normal range of motion and neck supple.  Skin:    General: Skin is warm and dry.     Findings: Bruising and ecchymosis present.       Neurological:     Mental Status: She is alert and oriented to person, place, and time.     Cranial Nerves: No cranial nerve deficit.     Deep Tendon Reflexes: Reflexes are normal and symmetric.  Psychiatric:        Behavior: Behavior normal.        Thought Content: Thought content normal.  Judgment: Judgment normal.     BP 124/86    Pulse 87    Temp 98 F (36.7 C) (Temporal)    Ht 5\' 5"  (1.651 m)    Wt 215 lb (97.5 kg)    BMI 35.78 kg/m       Assessment & Plan:  Elizabeth Brewer comes in today with chief complaint of Fall   Diagnosis and orders addressed:  1. Essential hypertension - hydrochlorothiazide (MICROZIDE) 12.5 MG capsule; Take 1 capsule (12.5 mg total) by mouth daily.  Dispense: 90 capsule; Refill: 0  2. TOBACCO ABUSE  3. Fall on concrete  4. Contusion of scalp, initial encounte  5. Anxiety  - citalopram (CELEXA) 40 MG tablet; TAKE  ONE (1) TABLET EACH DAY  Dispense: 90 tablet; Refill: 1  6. Benzodiazepine dependence (HCC) - citalopram (CELEXA) 40 MG tablet; TAKE ONE (1) TABLET EACH DAY  Dispense: 90 tablet; Refill: 1  7. Chronic obstructive pulmonary disease, unspecified COPD type (HCC) - fluticasone furoate-vilanterol (BREO ELLIPTA) 100-25 MCG/ACT AEPB; Inhale 1 puff into the lungs daily.  Dispense: 60 each; Refill: 11  8. Localized edema - furosemide (LASIX) 20 MG tablet; Take 1 tablet (20 mg total) by mouth daily.  Dispense: 30 tablet; Refill: 0  9. Gastroesophageal reflux disease, unspecified whether esophagitis present  10. Hypothyroidism, unspecified type    Medications refilled Health Maintenance reviewed Diet and exercise encouraged  Follow up plan: Keep chronic follow up   [name], FNP

## 2021-09-28 ENCOUNTER — Ambulatory Visit (INDEPENDENT_AMBULATORY_CARE_PROVIDER_SITE_OTHER): Payer: Medicare Other | Admitting: Family

## 2021-09-28 ENCOUNTER — Encounter: Payer: Self-pay | Admitting: Family

## 2021-09-28 VITALS — BP 150/72 | HR 75 | Temp 97.3°F | Ht 65.0 in | Wt 214.0 lb

## 2021-09-28 DIAGNOSIS — E785 Hyperlipidemia, unspecified: Secondary | ICD-10-CM

## 2021-09-28 DIAGNOSIS — F1320 Sedative, hypnotic or anxiolytic dependence, uncomplicated: Secondary | ICD-10-CM | POA: Diagnosis not present

## 2021-09-28 DIAGNOSIS — F419 Anxiety disorder, unspecified: Secondary | ICD-10-CM | POA: Diagnosis not present

## 2021-09-28 DIAGNOSIS — E039 Hypothyroidism, unspecified: Secondary | ICD-10-CM

## 2021-09-28 DIAGNOSIS — K5900 Constipation, unspecified: Secondary | ICD-10-CM | POA: Diagnosis not present

## 2021-09-28 DIAGNOSIS — I10 Essential (primary) hypertension: Secondary | ICD-10-CM

## 2021-09-28 DIAGNOSIS — K219 Gastro-esophageal reflux disease without esophagitis: Secondary | ICD-10-CM | POA: Diagnosis not present

## 2021-09-28 DIAGNOSIS — J449 Chronic obstructive pulmonary disease, unspecified: Secondary | ICD-10-CM

## 2021-09-28 DIAGNOSIS — F17200 Nicotine dependence, unspecified, uncomplicated: Secondary | ICD-10-CM

## 2021-09-28 DIAGNOSIS — M542 Cervicalgia: Secondary | ICD-10-CM

## 2021-09-28 DIAGNOSIS — G8929 Other chronic pain: Secondary | ICD-10-CM

## 2021-09-28 DIAGNOSIS — F331 Major depressive disorder, recurrent, moderate: Secondary | ICD-10-CM

## 2021-09-28 DIAGNOSIS — Z79899 Other long term (current) drug therapy: Secondary | ICD-10-CM | POA: Diagnosis not present

## 2021-09-28 NOTE — Patient Instructions (Signed)
Managing Loss, Adult °People experience loss in many different ways throughout their lives. Events such as moving, changing jobs, and losing friends can create a sense of loss. The loss may be as serious as a major health change, divorce, death of a pet, or death of a loved one. All of these types of loss are likely to create a physical and emotional reaction known as grief. Grief is the result of a major change or an absence of something or someone that you count on. Grief is a normal reaction to loss. °A variety of factors can affect your grieving experience, including: °The nature of your loss. °Your relationship to what or whom you lost. °Your understanding of grief and how to manage it. °Your support system. °Be aware that when grief becomes extreme, it can lead to more severe issues like isolation, depression, anxiety, or suicidal thoughts. Talk with your health care provider if you have any of these issues. °How to manage lifestyle changes °Keep to your normal routine as much as possible. °If you have trouble focusing or doing normal activities, it is acceptable to take some time away from your normal routine. °Spend time with friends and loved ones. °Eat a healthy diet, get plenty of sleep, and rest when you feel tired. °How to recognize changes  °The way that you deal with your grief will affect your ability to function as you normally do. When grieving, you may experience these changes: °Numbness, shock, sadness, anxiety, anger, denial, and guilt. °Thoughts about death. °Unexpected crying. °A physical sensation of emptiness in your stomach. °Problems sleeping and eating. °Tiredness (fatigue). °Loss of interest in normal activities. °Dreaming about or imagining seeing the person who died. °A need to remember what or whom you lost. °Difficulty thinking about anything other than your loss for a period of time. °Relief. If you have been expecting the loss for a while, you may feel a sense of relief when it  happens. °Follow these instructions at home: °Activity °Express your feelings in healthy ways, such as: °Talking with others about your loss. It may be helpful to find others who have had a similar loss, such as a support group. °Writing down your feelings in a journal. °Doing physical activities to release stress and emotional energy. °Doing creative activities like painting, sculpting, or playing or listening to music. °Practicing resilience. This is the ability to recover and adjust after facing challenges. Reading some resources that encourage resilience may help you to learn ways to practice those behaviors. ° °General instructions °Be patient with yourself and others. Allow the grieving process to happen, and remember that grieving takes time. °It is likely that you may never feel completely done with some grief. You may find a way to move on while still cherishing memories and feelings about your loss. °Accepting your loss is a process. It can take months or longer to adjust. °Keep all follow-up visits. This is important. °Where to find support °To get support for managing loss: °Ask your health care provider for help and recommendations, such as grief counseling or therapy. °Think about joining a support group for people who are managing a loss. °Where to find more information °You can find more information about managing loss from: °American Society of Clinical Oncology: www.cancer.net °American Psychological Association: www.apa.org °Contact a health care provider if: °Your grief is extreme and keeps getting worse. °You have ongoing grief that does not improve. °Your body shows symptoms of grief, such as illness. °You feel depressed, anxious, or   hopeless. °Get help right away if: °You have thoughts about hurting yourself or others. °Get help right away if you feel like you may hurt yourself or others, or have thoughts about taking your own life. Go to your nearest emergency room or: °Call 911. °Call the  National Suicide Prevention Lifeline at 1-800-273-8255 or 988. This is open 24 hours a day. °Text the Crisis Text Line at 741741. °Summary °Grief is the result of a major change or an absence of someone or something that you count on. Grief is a normal reaction to loss. °The depth of grief and the period of recovery depend on the type of loss and your ability to adjust to the change and process your feelings. °Processing grief requires patience and a willingness to accept your feelings and talk about your loss with people who are supportive. °It is important to find resources that work for you and to realize that people experience grief differently. There is not one grieving process that works for everyone in the same way. °Be aware that when grief becomes extreme, it can lead to more severe issues like isolation, depression, anxiety, or suicidal thoughts. Talk with your health care provider if you have any of these issues. °This information is not intended to replace advice given to you by your health care provider. Make sure you discuss any questions you have with your health care provider. °Document Revised: 04/10/2021 Document Reviewed: 04/10/2021 °Elsevier Patient Education © 2022 Elsevier Inc. ° °

## 2021-09-28 NOTE — Progress Notes (Signed)
Subjective:    Patient ID: Elizabeth Brewer, female    DOB: 26-Aug-1961, 61 y.o.   MRN: [id number]  Chief Complaint  Patient presents with   Medical Management of Chronic Issues   PT presents to  the office today for chronic follow up. PT is followed by a neurologists every 3 months for chronic neck pain,seizure, and migraines. These are stable at this time.    She is followed by [facility] every 3 months.     She states her brother died in 11-06-22 and her husband shot himself. She has had a great deal of anxiety with this.   She has COPD and continues to smoke 1 pack a day.   She is morbid obese with a BMI of 35 and HTN and Hyperlipidemia.  Hypertension This is a chronic problem. The current episode started more than 1 year ago. The problem has been waxing and waning since onset. The problem is uncontrolled. Associated symptoms include malaise/fatigue, palpitations and shortness of breath. Pertinent negatives include no peripheral edema. Risk factors for coronary artery disease include dyslipidemia, diabetes mellitus, obesity and sedentary lifestyle. The current treatment provides moderate improvement. Identifiable causes of hypertension include a thyroid problem.  Gastroesophageal Reflux She complains of belching, heartburn and a hoarse voice. This is a chronic problem. The current episode started more than 1 year ago. The problem occurs occasionally. The problem has been waxing and waning. Associated symptoms include fatigue. Risk factors include obesity. She has tried a PPI for the symptoms. The treatment provided moderate relief.  Thyroid Problem Presents for follow-up visit. Symptoms include anxiety, constipation, depressed mood, fatigue, hoarse voice and palpitations. The symptoms have been stable. Her past medical history is significant for hyperlipidemia.  Nicotine Dependence Presents for follow-up visit. Symptoms include fatigue. Her urge triggers include company of smokers.  The symptoms have been stable. She smokes 1 pack of cigarettes per day.  Depression        This is a chronic problem.  The current episode started more than 1 year ago.   The onset quality is gradual.   The problem occurs intermittently.  Associated symptoms include fatigue, helplessness, hopelessness, irritable, restlessness and sad.  Past medical history includes thyroid problem.   Hyperlipidemia This is a chronic problem. The current episode started more than 1 year ago. Exacerbating diseases include obesity. Associated symptoms include shortness of breath. Current antihyperlipidemic treatment includes statins. The current treatment provides moderate improvement of lipids. Risk factors for coronary artery disease include dyslipidemia, hypertension, a sedentary lifestyle and post-menopausal.  Constipation This is a chronic problem. The current episode started more than 1 year ago. Her stool frequency is 2 to 3 times per week. She has tried laxatives for the symptoms. The treatment provided moderate relief.     Review of Systems  Constitutional:  Positive for fatigue and malaise/fatigue.  HENT:  Positive for hoarse voice.   Respiratory:  Positive for shortness of breath.   Cardiovascular:  Positive for palpitations.  Gastrointestinal:  Positive for constipation and heartburn.  Psychiatric/Behavioral:  Positive for depression. The patient is nervous/anxious.   All other systems reviewed and are negative.     Objective:   Physical Exam Vitals reviewed.  Constitutional:      General: She is irritable. She is not in acute distress.    Appearance: She is well-developed. She is obese.  HENT:     Head: Normocephalic and atraumatic.     Right Ear: Tympanic membrane normal.  Left Ear: Tympanic membrane normal.  Eyes:     Pupils: Pupils are equal, round, and reactive to light.  Neck:     Thyroid: No thyromegaly.  Cardiovascular:     Rate and Rhythm: Normal rate and regular rhythm.      Heart sounds: Normal heart sounds. No murmur heard. Pulmonary:     Effort: Pulmonary effort is normal. No respiratory distress.     Breath sounds: Normal breath sounds. No wheezing.  Abdominal:     General: Bowel sounds are normal. There is no distension.     Palpations: Abdomen is soft.     Tenderness: There is no abdominal tenderness.  Musculoskeletal:        General: No tenderness. Normal range of motion.     Cervical back: Normal range of motion and neck supple.     Right lower leg: Edema (traace) present.     Left lower leg: Edema (trace) present.  Skin:    General: Skin is warm and dry.  Neurological:     Mental Status: She is alert and oriented to person, place, and time.     Cranial Nerves: No cranial nerve deficit.     Deep Tendon Reflexes: Reflexes are normal and symmetric.  Psychiatric:        Mood and Affect: Mood is anxious. Affect is tearful.        Behavior: Behavior normal.        Thought Content: Thought content normal.        Judgment: Judgment normal.      BP (!) 150/72    Pulse 75    Temp (!) 97.3 F (36.3 C) (Temporal)    Ht 5\' 5"  (1.651 m)    Wt 214 lb (97.1 kg)    BMI 35.61 kg/m      Assessment & Plan:  Elizabeth Brewer comes in today with chief complaint of Medical Management of Chronic Issues   Diagnosis and orders addressed:  1. Anxiety  2. Benzodiazepine dependence ([place])  3. Controlled substance agreement signed  4. Essential hypertension  5. Chronic obstructive pulmonary disease, unspecified COPD type ([place])  6. Gastroesophageal reflux disease, unspecified whether esophagitis presen  7. Hypothyroidism, unspecified type  8. Chronic neck pain  9. Constipation, unspecified constipation type  10. Moderate episode of recurrent major depressive disorder ([place])  11. Hyperlipidemia, unspecified hyperlipidemia type  12. Morbid obesity ([place])  13. TOBACCO ABUSE   Labs pending Health Maintenance reviewed Diet and exercise  encouraged  Follow up plan: 3 months    [name], FNP

## 2021-10-01 ENCOUNTER — Other Ambulatory Visit: Payer: Self-pay | Admitting: Family

## 2021-10-01 DIAGNOSIS — I10 Essential (primary) hypertension: Secondary | ICD-10-CM

## 2021-10-09 ENCOUNTER — Other Ambulatory Visit: Payer: Self-pay | Admitting: Family

## 2021-10-09 DIAGNOSIS — I10 Essential (primary) hypertension: Secondary | ICD-10-CM

## 2021-10-14 ENCOUNTER — Other Ambulatory Visit: Payer: Self-pay | Admitting: Family

## 2021-10-14 DIAGNOSIS — I10 Essential (primary) hypertension: Secondary | ICD-10-CM

## 2021-11-01 DIAGNOSIS — M25512 Pain in left shoulder: Secondary | ICD-10-CM | POA: Diagnosis not present

## 2021-11-01 DIAGNOSIS — G43109 Migraine with aura, not intractable, without status migrainosus: Secondary | ICD-10-CM | POA: Diagnosis not present

## 2021-11-01 DIAGNOSIS — M542 Cervicalgia: Secondary | ICD-10-CM | POA: Diagnosis not present

## 2021-11-01 DIAGNOSIS — Z79899 Other long term (current) drug therapy: Secondary | ICD-10-CM | POA: Diagnosis not present

## 2021-11-09 ENCOUNTER — Other Ambulatory Visit: Payer: Self-pay | Admitting: Family

## 2021-12-20 ENCOUNTER — Ambulatory Visit (INDEPENDENT_AMBULATORY_CARE_PROVIDER_SITE_OTHER): Payer: Medicare Other

## 2021-12-20 VITALS — Wt 214.0 lb

## 2021-12-20 DIAGNOSIS — Z0000 Encounter for general adult medical examination without abnormal findings: Secondary | ICD-10-CM

## 2021-12-20 DIAGNOSIS — Z1231 Encounter for screening mammogram for malignant neoplasm of breast: Secondary | ICD-10-CM

## 2021-12-20 NOTE — Patient Instructions (Signed)
Ms. Stepney , ?Thank you for taking time to come for your Medicare Wellness Visit. I appreciate your ongoing commitment to your health goals. Please review the following plan we discussed and let me know if I can assist you in the future.  ? ?Screening recommendations/referrals: ?Colonoscopy: Done 01/04/2014 - Repeat in 10 years  ?Mammogram: Done 10/21/2020 - Repeat annually *ordered today - call [facility] for appointment soon ?Bone Density: Done 07/18/2020 - Repeat every 2 years ?Recommended yearly ophthalmology/optometry visit for glaucoma screening and checkup ?Recommended yearly dental visit for hygiene and checkup ? ?Vaccinations: ?Influenza vaccine: Done 06/26/2021 - Repeat annually ?Pneumococcal vaccine: Done 03/13/2018 - get Prevnar next ?Tdap vaccine: Done 03/10/2015 - Repeat in 10 years ?Shingles vaccine: Done 09/19/2021 - get second dose at next visit  ?Covid-19: Done 12/01/2019, 12/12/2019, & 07/08/2020 ? ?Advanced directives: Advance directive discussed with you today. Even though you declined this today, please call our office should you change your mind, and we can give you the proper paperwork for you to fill out.  ? ?Conditions/risks identified: Aim for 30 minutes of exercise or brisk walking, 6-8 glasses of water, and 5 servings of fruits and vegetables each day.  ? ?Next appointment: Follow up in one year for your annual wellness visit.  ? ?Preventive Care 40-64 Years, Female ?Preventive care refers to lifestyle choices and visits with your health care provider that can promote health and wellness. ?What does preventive care include? ?A yearly physical exam. This is also called an annual well check. ?Dental exams once or twice a year. ?Routine eye exams. Ask your health care provider how often you should have your eyes checked. ?Personal lifestyle choices, including: ?Daily care of your teeth and gums. ?Regular physical activity. ?Eating a healthy diet. ?Avoiding tobacco and drug use. ?Limiting alcohol  use. ?Practicing safe sex. ?Taking low-dose aspirin daily starting at age 50. ?Taking vitamin and mineral supplements as recommended by your health care provider. ?What happens during an annual well check? ?The services and screenings done by your health care provider during your annual well check will depend on your age, overall health, lifestyle risk factors, and family history of disease. ?Counseling  ?Your health care provider may ask you questions about your: ?Alcohol use. ?Tobacco use. ?Drug use. ?Emotional well-being. ?Home and relationship well-being. ?Sexual activity. ?Eating habits. ?Work and work Statistician. ?Method of birth control. ?Menstrual cycle. ?Pregnancy history. ?Screening  ?You may have the following tests or measurements: ?Height, weight, and BMI. ?Blood pressure. ?Lipid and cholesterol levels. These may be checked every 5 years, or more frequently if you are over 45 years old. ?Skin check. ?Lung cancer screening. You may have this screening every year starting at age 20 if you have a 30-pack-year history of smoking and currently smoke or have quit within the past 15 years. ?Fecal occult blood test (FOBT) of the stool. You may have this test every year starting at age 79. ?Flexible sigmoidoscopy or colonoscopy. You may have a sigmoidoscopy every 5 years or a colonoscopy every 10 years starting at age 2. ?Hepatitis C blood test. ?Hepatitis B blood test. ?Sexually transmitted disease (STD) testing. ?Diabetes screening. This is done by checking your blood sugar (glucose) after you have not eaten for a while (fasting). You may have this done every 1-3 years. ?Mammogram. This may be done every 1-2 years. Talk to your health care provider about when you should start having regular mammograms. This may depend on whether you have a family history of breast cancer. ?  BRCA-related cancer screening. This may be done if you have a family history of breast, ovarian, tubal, or peritoneal cancers. ?Pelvic  exam and Pap test. This may be done every 3 years starting at age 65. Starting at age 31, this may be done every 5 years if you have a Pap test in combination with an HPV test. ?Bone density scan. This is done to screen for osteoporosis. You may have this scan if you are at high risk for osteoporosis. ?Discuss your test results, treatment options, and if necessary, the need for more tests with your health care provider. ?Vaccines  ?Your health care provider may recommend certain vaccines, such as: ?Influenza vaccine. This is recommended every year. ?Tetanus, diphtheria, and acellular pertussis (Tdap, Td) vaccine. You may need a Td booster every 10 years. ?Zoster vaccine. You may need this after age 73. ?Pneumococcal 13-valent conjugate (PCV13) vaccine. You may need this if you have certain conditions and were not previously vaccinated. ?Pneumococcal polysaccharide (PPSV23) vaccine. You may need one or two doses if you smoke cigarettes or if you have certain conditions. ?Talk to your health care provider about which screenings and vaccines you need and how often you need them. ?This information is not intended to replace advice given to you by your health care provider. Make sure you discuss any questions you have with your health care provider. ?Document Released: 09/16/2015 Document Revised: 05/09/2016 Document Reviewed: 06/21/2015 ?Elsevier Interactive Patient Education ? 2017 Elsevier Inc. ? ? ? ?Fall Prevention in the Home ?Falls can cause injuries. They can happen to people of all ages. There are many things you can do to make your home safe and to help prevent falls. ?What can I do on the outside of my home? ?Regularly fix the edges of walkways and driveways and fix any cracks. ?Remove anything that might make you trip as you walk through a door, such as a raised step or threshold. ?Trim any bushes or trees on the path to your home. ?Use bright outdoor lighting. ?Clear any walking paths of anything that might  make someone trip, such as rocks or tools. ?Regularly check to see if handrails are loose or broken. Make sure that both sides of any steps have handrails. ?Any raised decks and porches should have guardrails on the edges. ?Have any leaves, snow, or ice cleared regularly. ?Use sand or salt on walking paths during winter. ?Clean up any spills in your garage right away. This includes oil or grease spills. ?What can I do in the bathroom? ?Use night lights. ?Install grab bars by the toilet and in the tub and shower. Do not use towel bars as grab bars. ?Use non-skid mats or decals in the tub or shower. ?If you need to sit down in the shower, use a plastic, non-slip stool. ?Keep the floor dry. Clean up any water that spills on the floor as soon as it happens. ?Remove soap buildup in the tub or shower regularly. ?Attach bath mats securely with double-sided non-slip rug tape. ?Do not have throw rugs and other things on the floor that can make you trip. ?What can I do in the bedroom? ?Use night lights. ?Make sure that you have a light by your bed that is easy to reach. ?Do not use any sheets or blankets that are too big for your bed. They should not hang down onto the floor. ?Have a firm chair that has side arms. You can use this for support while you get dressed. ?Do not have  throw rugs and other things on the floor that can make you trip. ?What can I do in the kitchen? ?Clean up any spills right away. ?Avoid walking on wet floors. ?Keep items that you use a lot in easy-to-reach places. ?If you need to reach something above you, use a strong step stool that has a grab bar. ?Keep electrical cords out of the way. ?Do not use floor polish or wax that makes floors slippery. If you must use wax, use non-skid floor wax. ?Do not have throw rugs and other things on the floor that can make you trip. ?What can I do with my stairs? ?Do not leave any items on the stairs. ?Make sure that there are handrails on both sides of the stairs  and use them. Fix handrails that are broken or loose. Make sure that handrails are as long as the stairways. ?Check any carpeting to make sure that it is firmly attached to the stairs. Fix any carpet that i
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
## 2021-12-28 ENCOUNTER — Other Ambulatory Visit: Payer: Self-pay | Admitting: Family

## 2021-12-28 ENCOUNTER — Emergency Department (HOSPITAL_COMMUNITY): Payer: Medicare Other

## 2021-12-28 ENCOUNTER — Encounter (HOSPITAL_COMMUNITY): Payer: Self-pay

## 2021-12-28 ENCOUNTER — Emergency Department (HOSPITAL_COMMUNITY)
Admission: EM | Admit: 2021-12-28 | Discharge: 2021-12-29 | Disposition: A | Discharge status: Home / Self Care | Payer: Medicare Other | Attending: Emergency Medicine | Admitting: Emergency Medicine

## 2021-12-28 ENCOUNTER — Other Ambulatory Visit: Payer: Self-pay

## 2021-12-28 DIAGNOSIS — T148XXA Other injury of unspecified body region, initial encounter: Secondary | ICD-10-CM | POA: Diagnosis not present

## 2021-12-28 DIAGNOSIS — S42342A Displaced spiral fracture of shaft of humerus, left arm, initial encounter for closed fracture: Secondary | ICD-10-CM | POA: Diagnosis not present

## 2021-12-28 DIAGNOSIS — G8929 Other chronic pain: Secondary | ICD-10-CM | POA: Diagnosis not present

## 2021-12-28 DIAGNOSIS — S0993XA Unspecified injury of face, initial encounter: Secondary | ICD-10-CM | POA: Diagnosis not present

## 2021-12-28 DIAGNOSIS — W1830XA Fall on same level, unspecified, initial encounter: Secondary | ICD-10-CM | POA: Diagnosis not present

## 2021-12-28 DIAGNOSIS — Z743 Need for continuous supervision: Secondary | ICD-10-CM | POA: Diagnosis not present

## 2021-12-28 DIAGNOSIS — W19XXXA Unspecified fall, initial encounter: Secondary | ICD-10-CM | POA: Diagnosis not present

## 2021-12-28 DIAGNOSIS — S42402A Unspecified fracture of lower end of left humerus, initial encounter for closed fracture: Secondary | ICD-10-CM | POA: Insufficient documentation

## 2021-12-28 DIAGNOSIS — M79602 Pain in left arm: Secondary | ICD-10-CM | POA: Diagnosis not present

## 2021-12-28 DIAGNOSIS — S59912A Unspecified injury of left forearm, initial encounter: Secondary | ICD-10-CM | POA: Diagnosis present

## 2021-12-28 DIAGNOSIS — Z043 Encounter for examination and observation following other accident: Secondary | ICD-10-CM | POA: Diagnosis not present

## 2021-12-28 NOTE — ED Triage Notes (Signed)
Pt BIB EMS from the [facility]. Pt was walking and fell on her left side. Pt complains of pain to her left upper arm.  ?

## 2021-12-29 ENCOUNTER — Encounter (HOSPITAL_COMMUNITY): Payer: Self-pay | Admitting: Emergency Medicine

## 2021-12-29 DIAGNOSIS — S0993XA Unspecified injury of face, initial encounter: Secondary | ICD-10-CM | POA: Diagnosis not present

## 2021-12-29 MED ORDER — KETOROLAC TROMETHAMINE 60 MG/2ML IM SOLN
60.0000 mg | Freq: Once | INTRAMUSCULAR | Status: AC
Start: 2021-12-29 — End: 2021-12-29
  Administered 2021-12-29: 60 mg via INTRAMUSCULAR
  Filled 2021-12-29: qty 2

## 2021-12-29 NOTE — ED Notes (Signed)
Ortho tech called for splint.

## 2021-12-29 NOTE — Progress Notes (Signed)
Orthopedic Tech Progress Note ?Patient Details:  ?Elizabeth Brewer ?04-10-1961 ?[id number] ? ?Ortho Devices ?Type of Ortho Device: Coapt ?Ortho Device/Splint Location: lue ?Ortho Device/Splint Interventions: Ordered, Application, Adjustment ?  ?Post Interventions ?Patient Tolerated: Well ? ?Edwina Barth ?12/29/2021, 2:46 AM ? ?

## 2021-12-29 NOTE — ED Provider Notes (Signed)
?[facility] DEPT ?Provider Note ? ? ?CSN: [id number] ?Arrival date & time: 12/28/21  2311 ? ?  ? ?History ? ?Chief Complaint  ?Patient presents with  ? Fall  ? ? ?Elizabeth Brewer is a 61 y.o. female. ? ?The history is provided by the patient.  ?Fall ?This is a new problem. The current episode started 1 to 2 hours ago. The problem occurs rarely. The problem has been resolved. Pertinent negatives include no chest pain, no abdominal pain, no headaches and no shortness of breath. Nothing aggravates the symptoms. Nothing relieves the symptoms. Treatments tried: home narcotics. The treatment provided no relief.  ?Patient with chronic pain on home narcotics slipped and fell onto left side at [facility].  Has left upper arm pain since fall.   ?  ? ?Home Medications ?Prior to Admission medications   ?Medication Sig Start Date End Date Taking? Authorizing Provider  ?ARIPiprazole (ABILIFY) 5 MG tablet Take 5 mg by mouth daily. 09/08/21   [provider]  ?citalopram (CELEXA) 40 MG tablet TAKE ONE (1) TABLET EACH DAY 09/19/21   [name], FNP  ?clonazePAM (KLONOPIN) 1 MG tablet TAKE 1 TABLET DAILY AS NEEDED FOR ANXIETY 06/26/21   [name], FNP  ?cyanocobalamin (,VITAMIN B-12,) 1000 MCG/ML injection Inject into the muscle. 11/09/21   [provider]  ?doxepin (SINEQUAN) 25 MG capsule Take by mouth. 09/08/21   [provider]  ?fluticasone furoate-vilanterol (BREO ELLIPTA) 100-25 MCG/ACT AEPB Inhale 1 puff into the lungs daily. 09/19/21   [name], FNP  ?folic acid (FOLVITE) 1 MG tablet Take 3 tablets (3 mg total) by mouth daily. 09/19/21   [name], FNP  ?furosemide (LASIX) 20 MG tablet Take 1 tablet (20 mg total) by mouth daily. 09/19/21   [name], FNP  ?gabapentin (NEURONTIN) 300 MG capsule Take 600 mg by mouth 3 (three) times daily.    [provider]  ?hydrochlorothiazide (MICROZIDE) 12.5 MG capsule TAKE 1 CAPSULE BY MOUTH DAILY  10/02/21   [name], FNP  ?levothyroxine (SYNTHROID) 100 MCG tablet Take 1 tablet (100 mcg total) by mouth daily. 09/19/21   [name], FNP  ?meloxicam (MOBIC) 15 MG tablet Take 1 tablet (15 mg total) by mouth daily. 09/19/21   [name], FNP  ?Multiple Vitamins-Minerals (VITAMIN D3 COMPLETE PO) Take by mouth. ?Patient not taking: Reported on 12/20/2021    [provider]  ?omeprazole (PRILOSEC) 20 MG capsule Take 1 capsule (20 mg total) by mouth daily. 09/19/21   [name], FNP  ?Oxycodone HCl 10 MG TABS Take 10 mg by mouth 3 (three) times daily as needed. 08/31/20   [provider]  ?potassium chloride SA (KLOR-CON) 20 MEQ tablet Take 1 tablet (20 mEq total) by mouth 2 (two) times daily. 06/26/21   [name], FNP  ?rosuvastatin (CRESTOR) 20 MG tablet TAKE 1 TABLET BY MOUTH IN THE  EVENING 10/02/21   [name], FNP  ?SUMAtriptan (IMITREX) 100 MG tablet  03/18/19   [provider]  ?zinc gluconate 50 MG tablet Take by mouth. 09/16/20   [provider]  ?budesonide-formoterol (SYMBICORT) 160-4.5 MCG/ACT inhaler Inhale 2 puffs into the lungs 2 (two) times daily. 09/26/18 03/27/19  [name], FNP  ?   ? ?Allergies    ?Patient has no known allergies.   ? ?Review of Systems   ?Review of Systems  ?Constitutional:  Negative for fever.  ?HENT:  Negative for facial swelling.   ?Eyes:  Negative for redness.  ?Respiratory:  Negative for shortness of breath.   ?Cardiovascular:  Negative for chest pain.  ?Gastrointestinal:  Negative for abdominal pain.  ?Musculoskeletal:  Positive for arthralgias.  ?Neurological:  Negative for headaches.  ?All other systems reviewed and are negative. ? ?Physical Exam ?Updated Vital Signs ?BP (!) 109/59 (BP Location: Right Arm)   Pulse 64   Temp 97.8 ?F (36.6 ?C) (Oral)   Resp 18   Ht '5\' 5"'$  (1.651 m)   Wt 97.5 kg   SpO2 97%   BMI 35.78 kg/m?  ?Physical Exam ?Vitals and nursing note reviewed.  ?Constitutional:   ?    General: She is not in acute distress. ?   Appearance: Normal appearance.  ?HENT:  ?   Head: Normocephalic and atraumatic.  ?   Right Ear: Tympanic membrane normal.  ?   Left Ear: Tympanic membrane normal.  ?   Nose: Nose normal.  ?Eyes:  ?   Conjunctiva/sclera: Conjunctivae normal.  ?   Pupils: Pupils are equal, round, and reactive to light.  ?Cardiovascular:  ?   Rate and Rhythm: Normal rate and regular rhythm.  ?   Pulses: Normal pulses.  ?   Heart sounds: Normal heart sounds.  ?Pulmonary:  ?   Effort: Pulmonary effort is normal.  ?   Breath sounds: Normal breath sounds.  ?Abdominal:  ?   General: Bowel sounds are normal.  ?   Palpations: Abdomen is soft.  ?   Tenderness: There is no abdominal tenderness. There is no guarding.  ?Musculoskeletal:  ?   Right shoulder: Normal.  ?   Left shoulder: Normal.  ?   Left upper arm: Bony tenderness present. No lacerations.  ?   Left elbow: Normal.  ?   Left forearm: Normal.  ?   Left wrist: Normal. No swelling, deformity, effusion, lacerations, tenderness, bony tenderness, snuff box tenderness or crepitus. Normal range of motion. Normal pulse.  ?   Left hand: Normal.  ?   Cervical back: Normal, normal range of motion and neck supple.  ?   Thoracic back: Normal.  ?   Left hip: Normal.  ?   Left upper leg: Normal.  ?   Right knee: Normal.  ?   Left knee: Normal.  ?   Instability Tests: Anterior drawer test negative. Posterior drawer test negative.  ?   Left lower leg: Normal.  ?   Right ankle: Normal.  ?   Right Achilles Tendon: Normal.  ?   Left ankle: Normal.  ?   Left Achilles Tendon: Normal.  ?   Left foot: Normal.  ?   Comments: All LUE compartments are soft intact biceps and triceps tendons intact pronation and supination of the forearm, cap refill < 2 secs to all digs of the left hand   ?Skin: ?   General: Skin is warm and dry.  ?   Capillary Refill: Capillary refill takes less than 2 seconds.  ?Neurological:  ?   General: No focal deficit present.  ?   Mental Status:  She is alert and oriented to person, place, and time.  ?   Deep Tendon Reflexes: Reflexes normal.  ?Psychiatric:     ?   Mood and Affect: Mood normal.     ?   Behavior: Behavior normal.  ? ? ?ED Results / Procedures / Treatments   ?Labs ?(all labs ordered are listed, but only abnormal results are displayed) ?  Labs Reviewed - No data to display ? ?EKG ?None ? ?Radiology ?DG Forearm Left ? ?Result Date: 12/29/2021 ?CLINICAL DATA:  Fall, left arm pain EXAM: LEFT FOREARM - 2 VIEW COMPARISON:  None. FINDINGS: There is no evidence of fracture or other focal bone lesions. Soft tissues are unremarkable. IMPRESSION: Negative. Electronically Signed   By: Rolm Baptise M.D.   On: 12/29/2021 00:01  ? ?CT Head Wo Contrast ? ?Result Date: 12/29/2021 ?CLINICAL DATA:  Penetrating facial trauma EXAM: CT HEAD WITHOUT CONTRAST TECHNIQUE: Contiguous axial images were obtained from the base of the skull through the vertex without intravenous contrast. RADIATION DOSE REDUCTION: This exam was performed according to the departmental dose-optimization program which includes automated exposure control, adjustment of the mA and/or kV according to patient size and/or use of iterative reconstruction technique. COMPARISON:  None. FINDINGS: Brain: Normal anatomic configuration. No abnormal intra or extra-axial mass lesion or fluid collection. No abnormal mass effect or midline shift. No evidence of acute intracranial hemorrhage or infarct. Ventricular size is normal. Cerebellum unremarkable. Vascular: Unremarkable Skull: Intact Sinuses/Orbits: Paranasal sinuses are clear. Orbits are unremarkable. Other: Mastoid air cells and middle ear cavities are clear. IMPRESSION: No acute intracranial abnormality.  No calvarial fracture. Electronically Signed   By: Fidela Salisbury M.D.   On: 12/29/2021 00:08  ? ?DG Humerus Left ? ?Result Date: 12/29/2021 ?CLINICAL DATA:  Fall, left arm pain EXAM: LEFT HUMERUS - 2+ VIEW COMPARISON:  None. FINDINGS: There is an  oblique/spiral fracture through the midshaft of the left humerus with mild displacement and angulation. Fracture extends proximally into the proximal shaft near the humeral neck. No subluxation or dislocation a

## 2022-01-01 DIAGNOSIS — M25512 Pain in left shoulder: Secondary | ICD-10-CM | POA: Diagnosis not present

## 2022-01-04 ENCOUNTER — Ambulatory Visit (HOSPITAL_COMMUNITY): Payer: Medicare Other

## 2022-01-05 ENCOUNTER — Other Ambulatory Visit: Payer: Self-pay | Admitting: Family

## 2022-01-05 DIAGNOSIS — R600 Localized edema: Secondary | ICD-10-CM

## 2022-01-08 ENCOUNTER — Encounter: Payer: Self-pay | Admitting: Family

## 2022-01-08 ENCOUNTER — Ambulatory Visit (INDEPENDENT_AMBULATORY_CARE_PROVIDER_SITE_OTHER): Payer: Medicare Other | Admitting: Family

## 2022-01-08 VITALS — BP 100/60 | HR 52 | Temp 97.7°F | Ht 66.0 in | Wt 220.2 lb

## 2022-01-08 DIAGNOSIS — F17200 Nicotine dependence, unspecified, uncomplicated: Secondary | ICD-10-CM | POA: Diagnosis not present

## 2022-01-08 DIAGNOSIS — Z79899 Other long term (current) drug therapy: Secondary | ICD-10-CM

## 2022-01-08 DIAGNOSIS — E559 Vitamin D deficiency, unspecified: Secondary | ICD-10-CM

## 2022-01-08 DIAGNOSIS — K219 Gastro-esophageal reflux disease without esophagitis: Secondary | ICD-10-CM

## 2022-01-08 DIAGNOSIS — F1320 Sedative, hypnotic or anxiolytic dependence, uncomplicated: Secondary | ICD-10-CM

## 2022-01-08 DIAGNOSIS — J449 Chronic obstructive pulmonary disease, unspecified: Secondary | ICD-10-CM

## 2022-01-08 DIAGNOSIS — I10 Essential (primary) hypertension: Secondary | ICD-10-CM | POA: Diagnosis not present

## 2022-01-08 DIAGNOSIS — Z23 Encounter for immunization: Secondary | ICD-10-CM

## 2022-01-08 DIAGNOSIS — K5900 Constipation, unspecified: Secondary | ICD-10-CM | POA: Diagnosis not present

## 2022-01-08 DIAGNOSIS — E039 Hypothyroidism, unspecified: Secondary | ICD-10-CM | POA: Diagnosis not present

## 2022-01-08 DIAGNOSIS — M25512 Pain in left shoulder: Secondary | ICD-10-CM | POA: Diagnosis not present

## 2022-01-08 DIAGNOSIS — S42342D Displaced spiral fracture of shaft of humerus, left arm, subsequent encounter for fracture with routine healing: Secondary | ICD-10-CM

## 2022-01-08 DIAGNOSIS — F419 Anxiety disorder, unspecified: Secondary | ICD-10-CM | POA: Diagnosis not present

## 2022-01-08 MED ORDER — BENZONATATE 200 MG PO CAPS: 200.0000 mg | ORAL_CAPSULE | Freq: Three times a day (TID) | ORAL | 1 refills | Status: DC | PRN

## 2022-01-08 NOTE — Patient Instructions (Signed)
Humerus Fracture Treated With Immobilization ? ?A humerus fracture is a break in the large bone in the upper arm (humerus). If the joint is stable and the bones are still in their normal position (nondisplaced), the injury may be treated with immobilization. This involves the use of a cast, splint, or sling to hold your arm in place. Immobilization ensures that your bones continue to stay in the correct position while your arm is healing. ?What are the causes? ?This condition may be caused by: ?A fall. ?A hard, direct hit to the arm. ?A motor vehicle accident. ?What increases the risk? ?The following factors may make you more likely to develop this condition: ?Having a disease that makes the bones thin and weak. ?Being elderly. ?What are the signs or symptoms? ?Symptoms of this condition include: ?Pain. ?Swelling. ?Bruising. ?Not being able to move your arm normally. ?How is this diagnosed? ?This condition may be diagnosed based on: ?A physical exam. ?X-rays of your upper arm, elbow, and shoulder. ?CT scan. ?How is this treated? ?Treatment for this condition involves wearing a cast, splint, or sling until the injured area is stable enough for you to begin range-of-motion exercises. You may also be prescribed pain medicine. ?Follow these instructions at home: ?If you have a nonremovable cast or splint: ?Do not put pressure on any part of the cast or splint until it is fully hardened. This may take several hours. ?Do not stick anything inside the cast or splint to scratch your skin. Doing that increases your risk of infection. ?Check the skin around the cast or splint every day. Tell your health care provider about any concerns. ?You may put lotion on dry skin around the edges of the cast or splint. Do not put lotion on the skin underneath the cast or splint. ?Keep the cast or splint clean and dry. ?If you have a removable splint or sling: ?Wear the splint or sling as told by your health care provider. Remove it only  as told by your health care provider. ?Check the skin around the splint or sling every day. Tell your health care provider about any concerns. ?Loosen the splint or sling if your fingers tingle, become numb, or turn cold and blue. ?Keep the splint or sling clean and dry. ?Bathing ?Do not take baths, swim, or use a hot tub until your health care provider approves. Ask your health care provider if you may take showers. You may only be allowed to take sponge baths. ?If the cast, splint, or sling is not waterproof: ?Do not let it get wet. ?Cover it with a watertight covering when you take a bath or shower. ?Managing pain, stiffness, and swelling ? ?If directed, put ice on the injured area. To do this: ?If you have a removable splint or sling, remove it as told by your health care provider. ?Put ice in a plastic bag. ?Place a towel between your skin and the bag or between your nonremovable cast or sling and the bag. ?Leave the ice on for 20 minutes, 2-3 times a day. ?Remove the ice if your skin turns bright red. This is very important. If you cannot feel pain, heat, or cold, you have a greater risk of damage to the area. ?Move your fingers often to reduce stiffness and swelling. ?Raise the injured area above the level of your heart while you are sitting or lying down. ?Driving ?Do not drive or operate machinery while taking prescription pain medicine. ?Ask your health care provider when it is  safe to drive if you have a cast, splint, or sling on your arm. ?Activity ?Do not lift anything until your health care provider says that it is safe. ?Return to your normal activities as told by your health care provider. Ask your health care provider what activities are safe for you. ?Do range-of-motion exercises only as told by your health care provider or physical therapist. ?General instructions ?Do not use any products that contain nicotine or tobacco. These products include cigarettes, chewing tobacco, and vaping devices, such  as e-cigarettes. These can delay bone healing. If you need help quitting, ask your health care provider. ?Take over-the-counter and prescription medicines only as told by your health care provider. ?Ask your health care provider if the medicine prescribed to you: ?Can cause constipation. You may need to take these actions to prevent or treat constipation: ?Drink enough fluid to keep your urine pale yellow. ?Take over-the-counter or prescription medicines. ?Eat foods that are high in fiber, such as beans, whole grains, and fresh fruits and vegetables. ?Limit foods that are high in fat and processed sugars, such as fried or sweet foods. ?Keep all follow-up visits. This is important. ?Contact a health care provider if: ?You have any new pain, swelling, or bruising. ?Your pain, swelling, and bruising do not improve. ?Your cast, splint, or sling becomes loose or damaged. ?Get help right away if: ?Your skin or fingers on your injured arm turn blue or gray. ?Your arm feels cold or numb. ?You have severe pain in your injured arm. ?Summary ?A humerus fracture is a break in the large bone in the upper arm. ?Immobilization involves the use of a cast, splint, or sling to hold your arm in place while the injury heals. ?Wear a splint or sling as told by your health care provider. Remove it only as told by your health care provider. ?Move your fingers often to reduce stiffness and swelling. ?This information is not intended to replace advice given to you by your health care provider. Make sure you discuss any questions you have with your health care provider. ?Document Revised: 10/03/2020 Document Reviewed: 10/03/2020 ?Elsevier Patient Education ? [employer]. ? ?

## 2022-01-08 NOTE — Progress Notes (Signed)
? ?Subjective:  ? ? Patient ID: Elizabeth Brewer, female    DOB: Mar 13, 1961, 61 y.o.   MRN: [id number] ? ?Chief Complaint  ?Patient presents with  ? Medical Management of Chronic Issues  ?  3 mos ckup  ? ?PT presents to the office today for chronic follow up. PT is followed by a neurologists every 3 months for chronic neck pain,seizure, and migraines. These are stable at this time.  ? ?She fell on 12/28/21 and had a displaced spiral fracture of humerus. She is followed by Ortho. She is getting a cast placed today. Surgery is pending depending on healing.  ?  ?She is followed by [facility] every 3 months.   ?  ?She states her brother died in 11-04-2022 and her husband shot himself. She has had a great deal of anxiety with this.  ?  ?She has COPD and continues to smoke 1 pack a day.  ?  ?She is morbid obese with a BMI of 35 and HTN and Hyperlipidemia.  ?Hypertension ?This is a chronic problem. The current episode started more than 1 year ago. The problem is controlled. Associated symptoms include malaise/fatigue and shortness of breath. Pertinent negatives include no palpitations or peripheral edema. Risk factors for coronary artery disease include dyslipidemia, obesity, sedentary lifestyle and smoking/tobacco exposure. Past treatments include diuretics. The current treatment provides moderate improvement. There is no history of CVA or heart failure. Identifiable causes of hypertension include a thyroid problem.  ?Gastroesophageal Reflux ?She complains of belching, heartburn and a hoarse voice. This is a chronic problem. The current episode started more than 1 year ago. The problem occurs occasionally. The problem has been waxing and waning. Associated symptoms include fatigue. Risk factors include obesity. She has tried a PPI for the symptoms. The treatment provided moderate relief.  ?Thyroid Problem ?Symptoms include dry skin, fatigue and hoarse voice. Patient reports no anxiety, constipation, diarrhea or  palpitations. The symptoms have been stable. There is no history of heart failure.  ?Nicotine Dependence ?Presents for follow-up visit. Symptoms include fatigue. Her urge triggers include company of smokers. The symptoms have been stable. She smokes 1 pack of cigarettes per day.  ?Depression ?       This is a chronic problem.  The current episode started more than 1 year ago.   The onset quality is gradual.   The problem occurs intermittently.  Associated symptoms include fatigue, irritable, restlessness and sad.  Associated symptoms include no helplessness and no hopelessness.  Compliance with treatment is good.  Past medical history includes thyroid problem.   ? ? ? ?Review of Systems  ?Constitutional:  Positive for fatigue and malaise/fatigue.  ?HENT:  Positive for hoarse voice.   ?Respiratory:  Positive for shortness of breath.   ?Cardiovascular:  Negative for palpitations.  ?Gastrointestinal:  Positive for heartburn. Negative for constipation and diarrhea.  ?Psychiatric/Behavioral:  Positive for depression. The patient is not nervous/anxious.   ?All other systems reviewed and are negative. ? ?   ?Objective:  ? Physical Exam ?Vitals reviewed.  ?Constitutional:   ?   General: She is irritable. She is not in acute distress. ?   Appearance: She is well-developed. She is obese.  ?HENT:  ?   Head: Normocephalic and atraumatic.  ?   Right Ear: Tympanic membrane normal.  ?   Left Ear: Tympanic membrane normal.  ?Eyes:  ?   Pupils: Pupils are equal, round, and reactive to light.  ?Neck:  ?   Thyroid: No thyromegaly.  ?  Cardiovascular:  ?   Rate and Rhythm: Normal rate and regular rhythm.  ?   Heart sounds: Normal heart sounds. No murmur heard. ?Pulmonary:  ?   Effort: Pulmonary effort is normal. No respiratory distress.  ?   Breath sounds: Normal breath sounds. No wheezing.  ?Abdominal:  ?   General: Bowel sounds are normal. There is no distension.  ?   Palpations: Abdomen is soft.  ?   Tenderness: There is no abdominal  tenderness.  ?Musculoskeletal:     ?   General: No tenderness. Normal range of motion.  ?   Cervical back: Normal range of motion and neck supple.  ?Skin: ?   General: Skin is warm and dry.  ?Neurological:  ?   Mental Status: She is alert and oriented to person, place, and time.  ?   Cranial Nerves: No cranial nerve deficit.  ?   Deep Tendon Reflexes: Reflexes are normal and symmetric.  ?Psychiatric:     ?   Behavior: Behavior normal.     ?   Thought Content: Thought content normal.     ?   Judgment: Judgment normal.  ? ? ? ? ?BP 100/60   Pulse (!) 52   Temp 97.7 ?F (36.5 ?C) (Oral)   Ht _0  (1.676 m)   Wt 220 lb 3.2 oz (99.9 kg)   SpO2 96%   BMI 35.54 kg/m?  ? ?   ?Assessment & Plan:  ?Elizabeth Brewer comes in today with chief complaint of Medical Management of Chronic Issues (3 mos ckup) ? ? ?Diagnosis and orders addressed: ? ?1. Anxiety ?- CMP14+EGFR ?- CBC with Differential/Platelet ? ?2. Benzodiazepine dependence ([place]) ?- CMP14+EGFR ?- CBC with Differential/Platelet ? ?3. Controlled substance agreement signed ?- CMP14+EGFR ?- CBC with Differential/Platelet ? ?4. Essential hypertension ?- CMP14+EGFR ?- CBC with Differential/Platelet ? ?5. Chronic obstructive pulmonary disease, unspecified COPD type ([place]) ?- CMP14+EGFR ?- CBC with Differential/Platelet ? ?6. Gastroesophageal reflux disease, unspecified whether esophagitis present ?- CMP14+EGFR ?- CBC with Differential/Platelet ? ?7. Hypothyroidism, unspecified type ?- CMP14+EGFR ?- CBC with Differential/Platelet ?- TSH ? ?8. Constipation, unspecified constipation type ?- CMP14+EGFR ?- CBC with Differential/Platelet ? ?9. Morbid obesity ([place]) ?- CMP14+EGFR ?- CBC with Differential/Platelet ? ?10. TOBACCO ABUSE ?- CMP14+EGFR ?- CBC with Differential/Platelet ? ?11. Vitamin D deficiency ?- CMP14+EGFR ?- CBC with Differential/Platelet ? ?12. Need for shingles vaccine ? ?- Varicella-zoster vaccine IM (Shingrix) ?- CMP14+EGFR ?- CBC with  Differential/Platelet ? ?13. Closed displaced spiral fracture of shaft of left humerus with routine healing, subsequent encounter ?Keep follow up with Ortho ? ? ?Labs pending ?Health Maintenance reviewed ?Diet and exercise encouraged ? ?Follow up plan: ?6 months  ? ? ?[name], FNP ? ? ? ?

## 2022-01-09 LAB — CBC WITH DIFFERENTIAL/PLATELET
Basophils Absolute: 0 10*3/uL (ref 0.0–0.2)
Basos: 1 %
EOS (ABSOLUTE): 0.3 10*3/uL (ref 0.0–0.4)
Eos: 4 %
Hematocrit: 36.6 % (ref 34.0–46.6)
Hemoglobin: 12.4 g/dL (ref 11.1–15.9)
Immature Grans (Abs): 0 10*3/uL (ref 0.0–0.1)
Immature Granulocytes: 1 %
Lymphocytes Absolute: 1.5 10*3/uL (ref 0.7–3.1)
Lymphs: 19 %
MCH: 32.6 pg (ref 26.6–33.0)
MCHC: 33.9 g/dL (ref 31.5–35.7)
MCV: 96 fL (ref 79–97)
Monocytes Absolute: 0.4 10*3/uL (ref 0.1–0.9)
Monocytes: 5 %
Neutrophils Absolute: 5.6 10*3/uL (ref 1.4–7.0)
Neutrophils: 70 %
Platelets: 249 10*3/uL (ref 150–450)
RBC: 3.8 x10E6/uL (ref 3.77–5.28)
RDW: 12.9 % (ref 11.7–15.4)
WBC: 7.9 10*3/uL (ref 3.4–10.8)

## 2022-01-09 LAB — CMP14+EGFR
ALT: 13 IU/L (ref 0–32)
AST: 21 IU/L (ref 0–40)
Albumin/Globulin Ratio: 1.9 (ref 1.2–2.2)
Albumin: 4.2 g/dL (ref 3.8–4.9)
Alkaline Phosphatase: 85 IU/L (ref 44–121)
BUN/Creatinine Ratio: 13 (ref 12–28)
BUN: 10 mg/dL (ref 8–27)
Bilirubin Total: 0.5 mg/dL (ref 0.0–1.2)
CO2: 26 mmol/L (ref 20–29)
Calcium: 9 mg/dL (ref 8.7–10.3)
Chloride: 93 mmol/L — ABNORMAL LOW (ref 96–106)
Creatinine, Ser: 0.77 mg/dL (ref 0.57–1.00)
Globulin, Total: 2.2 g/dL (ref 1.5–4.5)
Glucose: 141 mg/dL — ABNORMAL HIGH (ref 70–99)
Potassium: 3.6 mmol/L (ref 3.5–5.2)
Sodium: 133 mmol/L — ABNORMAL LOW (ref 134–144)
Total Protein: 6.4 g/dL (ref 6.0–8.5)
eGFR: 88 mL/min/{1.73_m2} (ref >59)

## 2022-01-09 LAB — TSH: TSH: 9.58 u[IU]/mL — ABNORMAL HIGH (ref 0.450–4.500)

## 2022-01-12 ENCOUNTER — Ambulatory Visit (INDEPENDENT_AMBULATORY_CARE_PROVIDER_SITE_OTHER): Payer: Medicare Other | Admitting: Family Medicine

## 2022-01-12 ENCOUNTER — Encounter: Payer: Self-pay | Admitting: Family Medicine

## 2022-01-12 DIAGNOSIS — U071 COVID-19: Secondary | ICD-10-CM

## 2022-01-12 MED ORDER — MOLNUPIRAVIR EUA 200MG CAPSULE: 4.0000 | ORAL_CAPSULE | Freq: Two times a day (BID) | ORAL | 0 refills | Status: AC
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
## 2022-01-17 DIAGNOSIS — M25512 Pain in left shoulder: Secondary | ICD-10-CM | POA: Diagnosis not present

## 2022-01-17 DIAGNOSIS — S42302A Unspecified fracture of shaft of humerus, left arm, initial encounter for closed fracture: Secondary | ICD-10-CM | POA: Diagnosis not present

## 2022-02-05 DIAGNOSIS — M25512 Pain in left shoulder: Secondary | ICD-10-CM | POA: Diagnosis not present

## 2022-02-09 DIAGNOSIS — L708 Other acne: Secondary | ICD-10-CM | POA: Diagnosis not present

## 2022-02-09 DIAGNOSIS — B078 Other viral warts: Secondary | ICD-10-CM | POA: Diagnosis not present

## 2022-02-09 DIAGNOSIS — L821 Other seborrheic keratosis: Secondary | ICD-10-CM | POA: Diagnosis not present

## 2022-02-12 DIAGNOSIS — D23111 Other benign neoplasm of skin of right upper eyelid, including canthus: Secondary | ICD-10-CM | POA: Diagnosis not present

## 2022-02-12 DIAGNOSIS — H2513 Age-related nuclear cataract, bilateral: Secondary | ICD-10-CM | POA: Diagnosis not present

## 2022-02-12 DIAGNOSIS — H43392 Other vitreous opacities, left eye: Secondary | ICD-10-CM | POA: Diagnosis not present

## 2022-02-17 ENCOUNTER — Other Ambulatory Visit: Payer: Self-pay | Admitting: Family

## 2022-02-17 DIAGNOSIS — I10 Essential (primary) hypertension: Secondary | ICD-10-CM

## 2022-02-19 NOTE — Telephone Encounter (Signed)
Please review and advise. HCTZ was D/c'd

## 2022-02-26 DIAGNOSIS — S42302A Unspecified fracture of shaft of humerus, left arm, initial encounter for closed fracture: Secondary | ICD-10-CM | POA: Diagnosis not present

## 2022-03-19 ENCOUNTER — Ambulatory Visit: Payer: Medicare Other | Attending: Surgical

## 2022-03-19 DIAGNOSIS — M25512 Pain in left shoulder: Secondary | ICD-10-CM | POA: Insufficient documentation

## 2022-03-19 DIAGNOSIS — R293 Abnormal posture: Secondary | ICD-10-CM | POA: Insufficient documentation

## 2022-03-19 DIAGNOSIS — M25612 Stiffness of left shoulder, not elsewhere classified: Secondary | ICD-10-CM | POA: Diagnosis not present
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
## 2022-03-20 ENCOUNTER — Other Ambulatory Visit: Payer: Self-pay

## 2022-03-20 ENCOUNTER — Other Ambulatory Visit: Payer: Self-pay | Admitting: Family Medicine

## 2022-03-21 DIAGNOSIS — G43109 Migraine with aura, not intractable, without status migrainosus: Secondary | ICD-10-CM | POA: Diagnosis not present

## 2022-03-21 DIAGNOSIS — M545 Low back pain, unspecified: Secondary | ICD-10-CM | POA: Diagnosis not present

## 2022-03-21 DIAGNOSIS — E538 Deficiency of other specified B group vitamins: Secondary | ICD-10-CM | POA: Diagnosis not present

## 2022-03-22 ENCOUNTER — Ambulatory Visit: Payer: Medicare Other | Admitting: Physical Therapy

## 2022-03-22 ENCOUNTER — Encounter: Payer: Self-pay | Admitting: Physical Therapy

## 2022-03-22 DIAGNOSIS — M25612 Stiffness of left shoulder, not elsewhere classified: Secondary | ICD-10-CM | POA: Diagnosis not present

## 2022-03-22 DIAGNOSIS — R293 Abnormal posture: Secondary | ICD-10-CM | POA: Diagnosis not present

## 2022-03-22 DIAGNOSIS — M25512 Pain in left shoulder: Secondary | ICD-10-CM
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
## 2022-03-27 ENCOUNTER — Encounter: Payer: Medicare Other | Admitting: *Deleted

## 2022-03-29 ENCOUNTER — Encounter: Payer: Self-pay | Admitting: *Deleted

## 2022-03-29 ENCOUNTER — Ambulatory Visit: Payer: Medicare Other | Admitting: *Deleted

## 2022-03-29 DIAGNOSIS — M25612 Stiffness of left shoulder, not elsewhere classified: Secondary | ICD-10-CM | POA: Diagnosis not present

## 2022-03-29 DIAGNOSIS — R293 Abnormal posture: Secondary | ICD-10-CM | POA: Diagnosis not present

## 2022-03-29 DIAGNOSIS — M25512 Pain in left shoulder: Secondary | ICD-10-CM
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
## 2022-04-03 ENCOUNTER — Ambulatory Visit: Payer: Medicare Other | Attending: Surgical

## 2022-04-03 ENCOUNTER — Other Ambulatory Visit: Payer: Self-pay | Admitting: Family

## 2022-04-03 DIAGNOSIS — E876 Hypokalemia: Secondary | ICD-10-CM

## 2022-04-03 DIAGNOSIS — R293 Abnormal posture: Secondary | ICD-10-CM | POA: Diagnosis not present

## 2022-04-03 DIAGNOSIS — M25612 Stiffness of left shoulder, not elsewhere classified: Secondary | ICD-10-CM | POA: Insufficient documentation

## 2022-04-03 DIAGNOSIS — M25512 Pain in left shoulder: Secondary | ICD-10-CM | POA: Diagnosis not present
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
## 2022-04-05 ENCOUNTER — Encounter: Payer: Medicare Other | Admitting: Physical Therapy

## 2022-04-10 ENCOUNTER — Encounter: Payer: Medicare Other | Admitting: *Deleted

## 2022-04-11 ENCOUNTER — Encounter: Payer: Self-pay | Admitting: Gastroenterology
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
## 2022-04-12 ENCOUNTER — Encounter: Payer: Medicare Other | Admitting: *Deleted

## 2022-04-12 ENCOUNTER — Telehealth: Payer: Self-pay | Admitting: *Deleted

## 2022-04-12 ENCOUNTER — Encounter: Payer: Self-pay | Admitting: *Deleted

## 2022-04-12 ENCOUNTER — Encounter: Payer: Self-pay | Admitting: Gastroenterology

## 2022-04-12 ENCOUNTER — Ambulatory Visit (INDEPENDENT_AMBULATORY_CARE_PROVIDER_SITE_OTHER): Payer: Medicare Other | Admitting: Gastroenterology

## 2022-04-12 VITALS — BP 152/79 | HR 70 | Temp 97.8°F | Ht 61.0 in | Wt 220.6 lb

## 2022-04-12 DIAGNOSIS — Z86010 Personal history of colonic polyps: Secondary | ICD-10-CM | POA: Diagnosis not present

## 2022-04-12 DIAGNOSIS — R1310 Dysphagia, unspecified: Secondary | ICD-10-CM | POA: Insufficient documentation

## 2022-04-12 MED ORDER — PEG 3350-KCL-NA BICARB-NACL 420 G PO SOLR: 4000.0000 mL | Freq: Once | ORAL | 0 refills | Status: AC

## 2022-04-12 NOTE — Telephone Encounter (Signed)
Pt scheduled for TCS/EGD +/- DIL while in office. Appointment scheduled for 05/23/22 at 8:15

## 2022-04-12 NOTE — Patient Instructions (Addendum)
We will arrange for you to have a colonoscopy and upper endoscopy with possible stretching of your esophagus in the near future with Dr. Gala Romney.  Swallowing precautions:  Eat slowly, take small bites, chew thoroughly, drink plenty of liquids throughout meals.  Avoid trough textures All meats should be chopped finely.  If something gets hung in your esophagus and will not come up or go down, proceed to the emergency room.    We will follow-up with you in the office after your procedures.  Do not hesitate to call if you have any questions or concerns prior to next visit.  It was very nice to meet you today!  Aliene Altes, PA-C [facility]

## 2022-04-12 NOTE — Telephone Encounter (Signed)
Patient scheduled for EGD+/- dil.& TCS with Dr.Rourk on 05/23/22 at 8:15 am. Instructions and preop appt will be mailed to pt. Prep sent in to the pharmacy.

## 2022-04-13 NOTE — Telephone Encounter (Signed)
PA approved via [facility] website. Auth# [id number], DOS: May 23, 2022 - Aug 21, 2022

## 2022-04-17 ENCOUNTER — Ambulatory Visit: Payer: Medicare Other | Admitting: Physical Therapy

## 2022-04-17 ENCOUNTER — Encounter: Payer: Self-pay | Admitting: Physical Therapy

## 2022-04-17 DIAGNOSIS — M25612 Stiffness of left shoulder, not elsewhere classified: Secondary | ICD-10-CM | POA: Diagnosis not present

## 2022-04-17 DIAGNOSIS — R293 Abnormal posture: Secondary | ICD-10-CM | POA: Diagnosis not present

## 2022-04-17 DIAGNOSIS — M25512 Pain in left shoulder: Secondary | ICD-10-CM | POA: Diagnosis not present
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
## 2022-04-25 DIAGNOSIS — L304 Erythema intertrigo: Secondary | ICD-10-CM | POA: Diagnosis not present

## 2022-05-12 DIAGNOSIS — S42302D Unspecified fracture of shaft of humerus, left arm, subsequent encounter for fracture with routine healing: Secondary | ICD-10-CM | POA: Diagnosis not present

## 2022-05-17 NOTE — Patient Instructions (Signed)
Elizabeth Brewer Jerome  05/17/2022     '@PREFPERIOPPHARMACY'$ @   Your procedure is scheduled on  05/23/2022.   Report to [facility] at  [phone]  A.M.   Call this number if you have problems the morning of surgery:  [phone]   Remember:  Follow the diet and prep instructions given to you by the office.        Use your inhaler before you come and bring your rescue inhaler with you.     Take these medicines the morning of surgery with A SIP OF WATER        abilify, celexa, klonopin, sinequan, neurontin, synthroid, prilosec, oxycodone(if needed), imitrex(if needed), topamax.     Do not wear jewelry, make-up or nail polish.  Do not wear lotions, powders, or perfumes, or deodorant.  Do not shave 48 hours prior to surgery.  Men may shave face and neck.  Do not bring valuables to the hospital.  [facility] is not responsible for any belongings or valuables.  Contacts, dentures or bridgework may not be worn into surgery.  Leave your suitcase in the car.  After surgery it may be brought to your room.  For patients admitted to the hospital, discharge time will be determined by your treatment team.  Patients discharged the day of surgery will not be allowed to drive home and must have someone with them for 24 hours.    Special instructions:   DO NOT smoke tobacco or vape for 24 hours before your procedure.  Please read over the following fact sheets that you were given. Anesthesia Post-op Instructions and Care and Recovery After Surgery      Upper Endoscopy, Adult, Care After After the procedure, it is common to have a sore throat. It is also common to have: Mild stomach pain or discomfort. Bloating. Nausea. Follow these instructions at home: The instructions below may help you care for yourself at home. Your health care provider may give you more instructions. If you have questions, ask your health care provider. If you were given a sedative during the procedure, it can  affect you for several hours. Do not drive or operate machinery until your health care provider says that it is safe. If you will be going home right after the procedure, plan to have a responsible adult: Take you home from the hospital or clinic. You will not be allowed to drive. Care for you for the time you are told. Follow instructions from your health care provider about what you may eat and drink. Return to your normal activities as told by your health care provider. Ask your health care provider what activities are safe for you. Take over-the-counter and prescription medicines only as told by your health care provider. Contact a health care provider if you: Have a sore throat that lasts longer than one day. Have trouble swallowing. Have a fever. Get help right away if you: Vomit blood or your vomit looks like coffee grounds. Have bloody, black, or tarry stools. Have a very bad sore throat or you cannot swallow. Have difficulty breathing or very bad pain in your chest or abdomen. These symptoms may be an emergency. Get help right away. Call 911. Do not wait to see if the symptoms will go away. Do not drive yourself to the hospital. Summary After the procedure, it is common to have a sore throat, mild stomach discomfort, bloating, and nausea. If you were given a sedative  during the procedure, it can affect you for several hours. Do not drive until your health care provider says that it is safe. Follow instructions from your health care provider about what you may eat and drink. Return to your normal activities as told by your health care provider. This information is not intended to replace advice given to you by your health care provider. Make sure you discuss any questions you have with your health care provider. Document Revised: 11/29/2021 Document Reviewed: 11/29/2021 Elsevier Patient Education  [place]. Esophageal Dilatation Esophageal dilatation, also called  esophageal dilation, is a procedure to widen or open a blocked or narrowed part of the esophagus. The esophagus is the part of the body that moves food and liquid from the mouth to the stomach. You may need this procedure if: You have a buildup of scar tissue in your esophagus that makes it difficult, painful, or impossible to swallow. This can be caused by gastroesophageal reflux disease (GERD). You have cancer of the esophagus. There is a problem with how food moves through your esophagus. In some cases, you may need this procedure repeated at a later time to dilate the esophagus gradually. Tell a health care provider about: Any allergies you have. All medicines you are taking, including vitamins, herbs, eye drops, creams, and over-the-counter medicines. Any problems you or family members have had with anesthetic medicines. Any blood disorders you have. Any surgeries you have had. Any medical conditions you have. Any antibiotic medicines you are required to take before dental procedures. Whether you are pregnant or may be pregnant. What are the risks? Generally, this is a safe procedure. However, problems may occur, including: Bleeding due to a tear in the lining of the esophagus. A hole, or perforation, in the esophagus. What happens before the procedure? Ask your health care provider about: Changing or stopping your regular medicines. This is especially important if you are taking diabetes medicines or blood thinners. Taking medicines such as aspirin and ibuprofen. These medicines can thin your blood. Do not take these medicines unless your health care provider tells you to take them. Taking over-the-counter medicines, vitamins, herbs, and supplements. Follow instructions from your health care provider about eating or drinking restrictions. Plan to have a responsible adult take you home from the hospital or clinic. Plan to have a responsible adult care for you for the time you are told  after you leave the hospital or clinic. This is important. What happens during the procedure? You may be given a medicine to help you relax (sedative). A numbing medicine may be sprayed into the back of your throat, or you may gargle the medicine. Your health care provider may perform the dilatation using various surgical instruments, such as: Simple dilators. This instrument is carefully placed in the esophagus to stretch it. Guided wire bougies. This involves using an endoscope to insert a wire into the esophagus. A dilator is passed over this wire to enlarge the esophagus. Then the wire is removed. Balloon dilators. An endoscope with a small balloon is inserted into the esophagus. The balloon is inflated to stretch the esophagus and open it up. The procedure may vary among health care providers and hospitals. What can I expect after the procedure? Your blood pressure, heart rate, breathing rate, and blood oxygen level will be monitored until you leave the hospital or clinic. Your throat may feel slightly sore and numb. This will get better over time. You will not be allowed to eat or drink  until your throat is no longer numb. When you are able to drink, urinate, and sit on the edge of the bed without nausea or dizziness, you may be able to return home. Follow these instructions at home: Take over-the-counter and prescription medicines only as told by your health care provider. If you were given a sedative during the procedure, it can affect you for several hours. Do not drive or operate machinery until your health care provider says that it is safe. Plan to have a responsible adult care for you for the time you are told. This is important. Follow instructions from your health care provider about any eating or drinking restrictions. Do not use any products that contain nicotine or tobacco, such as cigarettes, e-cigarettes, and chewing tobacco. If you need help quitting, ask your health care  provider. Keep all follow-up visits. This is important. Contact a health care provider if: You have a fever. You have pain that is not relieved by medicine. Get help right away if: You have chest pain. You have trouble breathing. You have trouble swallowing. You vomit blood. You have black, tarry, or bloody stools. These symptoms may represent a serious problem that is an emergency. Do not wait to see if the symptoms will go away. Get medical help right away. Call your local emergency services (911 in the [place] Do not drive yourself to the hospital. Summary Esophageal dilatation, also called esophageal dilation, is a procedure to widen or open a blocked or narrowed part of the esophagus. Plan to have a responsible adult take you home from the hospital or clinic. For this procedure, a numbing medicine may be sprayed into the back of your throat, or you may gargle the medicine. Do not drive or operate machinery until your health care provider says that it is safe. This information is not intended to replace advice given to you by your health care provider. Make sure you discuss any questions you have with your health care provider. Document Revised: 01/06/2020 Document Reviewed: 01/06/2020 Elsevier Patient Education  [place]. Colonoscopy, Adult, Care After The following information offers guidance on how to care for yourself after your procedure. Your health care provider may also give you more specific instructions. If you have problems or questions, contact your health care provider. What can I expect after the procedure? After the procedure, it is common to have: A small amount of blood in your stool for 24 hours after the procedure. Some gas. Mild cramping or bloating of your abdomen. Follow these instructions at home: Eating and drinking  Drink enough fluid to keep your urine pale yellow. Follow instructions from your health care provider about eating or drinking  restrictions. Resume your normal diet as told by your health care provider. Avoid heavy or fried foods that are hard to digest. Activity Rest as told by your health care provider. Avoid sitting for a long time without moving. Get up to take short walks every 1-2 hours. This is important to improve blood flow and breathing. Ask for help if you feel weak or unsteady. Return to your normal activities as told by your health care provider. Ask your health care provider what activities are safe for you. Managing cramping and bloating  Try walking around when you have cramps or feel bloated. If directed, apply heat to your abdomen as told by your health care provider. Use the heat source that your health care provider recommends, such as a moist heat pack or a heating pad. Place  a towel between your skin and the heat source. Leave the heat on for 20-30 minutes. Remove the heat if your skin turns bright red. This is especially important if you are unable to feel pain, heat, or cold. You have a greater risk of getting burned. General instructions If you were given a sedative during the procedure, it can affect you for several hours. Do not drive or operate machinery until your health care provider says that it is safe. For the first 24 hours after the procedure: Do not sign important documents. Do not drink alcohol. Do your regular daily activities at a slower pace than normal. Eat soft foods that are easy to digest. Take over-the-counter and prescription medicines only as told by your health care provider. Keep all follow-up visits. This is important. Contact a health care provider if: You have blood in your stool 2-3 days after the procedure. Get help right away if: You have more than a small spotting of blood in your stool. You have large blood clots in your stool. You have swelling of your abdomen. You have nausea or vomiting. You have a fever. You have increasing pain in your abdomen that  is not relieved with medicine. These symptoms may be an emergency. Get help right away. Call 911. Do not wait to see if the symptoms will go away. Do not drive yourself to the hospital. Summary After the procedure, it is common to have a small amount of blood in your stool. You may also have mild cramping and bloating of your abdomen. If you were given a sedative during the procedure, it can affect you for several hours. Do not drive or operate machinery until your health care provider says that it is safe. Get help right away if you have a lot of blood in your stool, nausea or vomiting, a fever, or increased pain in your abdomen. This information is not intended to replace advice given to you by your health care provider. Make sure you discuss any questions you have with your health care provider. Document Revised: 04/12/2021 Document Reviewed: 04/12/2021 Elsevier Patient Education  [place] After This sheet gives you information about how to care for yourself after your procedure. Your health care provider may also give you more specific instructions. If you have problems or questions, contact your health care provider. What can I expect after the procedure? After the procedure, it is common to have: Tiredness. Forgetfulness about what happened after the procedure. Impaired judgment for important decisions. Nausea or vomiting. Some difficulty with balance. Follow these instructions at home: For the time period you were told by your health care provider:     Rest as needed. Do not participate in activities where you could fall or become injured. Do not drive or use machinery. Do not drink alcohol. Do not take sleeping pills or medicines that cause drowsiness. Do not make important decisions or sign legal documents. Do not take care of children on your own. Eating and drinking Follow the diet that is recommended by your health care  provider. Drink enough fluid to keep your urine pale yellow. If you vomit: Drink water, juice, or soup when you can drink without vomiting. Make sure you have little or no nausea before eating solid foods. General instructions Have a responsible adult stay with you for the time you are told. It is important to have someone help care for you until you are awake and alert. Take over-the-counter and prescription  medicines only as told by your health care provider. If you have sleep apnea, surgery and certain medicines can increase your risk for breathing problems. Follow instructions from your health care provider about wearing your sleep device: Anytime you are sleeping, including during daytime naps. While taking prescription pain medicines, sleeping medicines, or medicines that make you drowsy. Avoid smoking. Keep all follow-up visits as told by your health care provider. This is important. Contact a health care provider if: You keep feeling nauseous or you keep vomiting. You feel light-headed. You are still sleepy or having trouble with balance after 24 hours. You develop a rash. You have a fever. You have redness or swelling around the IV site. Get help right away if: You have trouble breathing. You have new-onset confusion at home. Summary For several hours after your procedure, you may feel tired. You may also be forgetful and have poor judgment. Have a responsible adult stay with you for the time you are told. It is important to have someone help care for you until you are awake and alert. Rest as told. Do not drive or operate machinery. Do not drink alcohol or take sleeping pills. Get help right away if you have trouble breathing, or if you suddenly become confused. This information is not intended to replace advice given to you by your health care provider. Make sure you discuss any questions you have with your health care provider. Document Revised: 07/25/2021 Document Reviewed:  07/23/2019 Elsevier Patient Education  [place].

## 2022-05-21 ENCOUNTER — Other Ambulatory Visit (HOSPITAL_COMMUNITY): Payer: Self-pay | Admitting: Family

## 2022-05-21 ENCOUNTER — Ambulatory Visit (HOSPITAL_COMMUNITY)
Admission: RE | Admit: 2022-05-21 | Discharge: 2022-05-21 | Disposition: A | Discharge status: Home / Self Care | Payer: Medicare Other | Source: Ambulatory Visit | Attending: Family | Admitting: Family

## 2022-05-21 ENCOUNTER — Encounter (HOSPITAL_COMMUNITY)
Admission: RE | Admit: 2022-05-21 | Discharge: 2022-05-21 | Disposition: A | Discharge status: Home / Self Care | Payer: Medicare Other | Source: Ambulatory Visit | Attending: Internal Medicine | Admitting: Internal Medicine

## 2022-05-21 VITALS — BP 142/72 | HR 72 | Temp 97.9°F | Resp 18 | Ht 61.0 in | Wt 220.7 lb

## 2022-05-21 DIAGNOSIS — Z1231 Encounter for screening mammogram for malignant neoplasm of breast: Secondary | ICD-10-CM | POA: Diagnosis not present

## 2022-05-21 DIAGNOSIS — I10 Essential (primary) hypertension: Secondary | ICD-10-CM | POA: Insufficient documentation

## 2022-05-21 DIAGNOSIS — Z01812 Encounter for preprocedural laboratory examination: Secondary | ICD-10-CM | POA: Insufficient documentation

## 2022-05-23 ENCOUNTER — Encounter (HOSPITAL_COMMUNITY)
Admission: RE | Disposition: A | Discharge status: Home / Self Care | Payer: Self-pay | Source: Home / Self Care | Attending: Internal Medicine

## 2022-05-23 ENCOUNTER — Ambulatory Visit (HOSPITAL_BASED_OUTPATIENT_CLINIC_OR_DEPARTMENT_OTHER): Payer: Medicare Other | Admitting: Anesthesiology

## 2022-05-23 ENCOUNTER — Telehealth: Payer: Self-pay

## 2022-05-23 ENCOUNTER — Encounter (HOSPITAL_COMMUNITY): Payer: Self-pay | Admitting: Internal Medicine

## 2022-05-23 ENCOUNTER — Ambulatory Visit (HOSPITAL_COMMUNITY)
Admission: RE | Admit: 2022-05-23 | Discharge: 2022-05-23 | Disposition: A | Discharge status: Home / Self Care | Payer: Medicare Other | Attending: Internal Medicine | Admitting: Internal Medicine

## 2022-05-23 ENCOUNTER — Ambulatory Visit (HOSPITAL_COMMUNITY): Payer: Medicare Other | Admitting: Anesthesiology

## 2022-05-23 ENCOUNTER — Other Ambulatory Visit: Payer: Self-pay | Admitting: *Deleted

## 2022-05-23 ENCOUNTER — Other Ambulatory Visit: Payer: Self-pay

## 2022-05-23 DIAGNOSIS — R1310 Dysphagia, unspecified: Secondary | ICD-10-CM | POA: Diagnosis not present

## 2022-05-23 DIAGNOSIS — Z86010 Personal history of colonic polyps: Secondary | ICD-10-CM | POA: Diagnosis not present

## 2022-05-23 DIAGNOSIS — F17210 Nicotine dependence, cigarettes, uncomplicated: Secondary | ICD-10-CM | POA: Insufficient documentation

## 2022-05-23 DIAGNOSIS — Z1211 Encounter for screening for malignant neoplasm of colon: Secondary | ICD-10-CM | POA: Insufficient documentation

## 2022-05-23 DIAGNOSIS — D120 Benign neoplasm of cecum: Secondary | ICD-10-CM | POA: Diagnosis not present

## 2022-05-23 DIAGNOSIS — J449 Chronic obstructive pulmonary disease, unspecified: Secondary | ICD-10-CM | POA: Diagnosis not present

## 2022-05-23 DIAGNOSIS — K449 Diaphragmatic hernia without obstruction or gangrene: Secondary | ICD-10-CM

## 2022-05-23 DIAGNOSIS — K635 Polyp of colon: Secondary | ICD-10-CM | POA: Diagnosis not present

## 2022-05-23 DIAGNOSIS — R569 Unspecified convulsions: Secondary | ICD-10-CM | POA: Diagnosis not present

## 2022-05-23 DIAGNOSIS — E039 Hypothyroidism, unspecified: Secondary | ICD-10-CM | POA: Diagnosis not present

## 2022-05-23 DIAGNOSIS — K222 Esophageal obstruction: Secondary | ICD-10-CM | POA: Insufficient documentation

## 2022-05-23 DIAGNOSIS — Z800 Family history of malignant neoplasm of digestive organs: Secondary | ICD-10-CM | POA: Insufficient documentation

## 2022-05-23 DIAGNOSIS — I10 Essential (primary) hypertension: Secondary | ICD-10-CM | POA: Diagnosis not present

## 2022-05-23 DIAGNOSIS — Z1212 Encounter for screening for malignant neoplasm of rectum: Secondary | ICD-10-CM | POA: Diagnosis not present

## 2022-05-23 DIAGNOSIS — G709 Myoneural disorder, unspecified: Secondary | ICD-10-CM | POA: Insufficient documentation

## 2022-05-23 DIAGNOSIS — Q438 Other specified congenital malformations of intestine: Secondary | ICD-10-CM | POA: Insufficient documentation

## 2022-05-23 DIAGNOSIS — K219 Gastro-esophageal reflux disease without esophagitis: Secondary | ICD-10-CM | POA: Insufficient documentation

## 2022-05-23 DIAGNOSIS — F418 Other specified anxiety disorders: Secondary | ICD-10-CM | POA: Insufficient documentation

## 2022-05-23 DIAGNOSIS — E876 Hypokalemia: Secondary | ICD-10-CM

## 2022-05-23 DIAGNOSIS — R519 Headache, unspecified: Secondary | ICD-10-CM | POA: Diagnosis not present

## 2022-05-23 SURGERY — COLONOSCOPY WITH PROPOFOL
Anesthesia: General

## 2022-05-23 MED ORDER — PROPOFOL 500 MG/50ML IV EMUL
INTRAVENOUS | Status: DC | PRN
  Administered 2022-05-23: 150 ug/kg/min via INTRAVENOUS

## 2022-05-23 MED ORDER — LACTATED RINGERS IV SOLN: INTRAVENOUS | Status: DC

## 2022-05-23 MED ORDER — POTASSIUM CHLORIDE CRYS ER 20 MEQ PO TBCR: EXTENDED_RELEASE_TABLET | ORAL | 0 refills | Status: DC

## 2022-05-23 MED ORDER — PROPOFOL 500 MG/50ML IV EMUL
INTRAVENOUS | Status: AC
  Filled 2022-05-23: qty 100

## 2022-05-23 MED ORDER — PROPOFOL 10 MG/ML IV BOLUS
INTRAVENOUS | Status: DC | PRN
  Administered 2022-05-23: 50 mg via INTRAVENOUS
  Administered 2022-05-23: 30 mg via INTRAVENOUS
  Administered 2022-05-23 (×2): 40 mg via INTRAVENOUS
  Administered 2022-05-23: 20 mg via INTRAVENOUS
  Administered 2022-05-23: 30 mg via INTRAVENOUS

## 2022-05-23 MED ORDER — OMEPRAZOLE 40 MG PO CPDR: 40.0000 mg | DELAYED_RELEASE_CAPSULE | Freq: Every day | ORAL | 11 refills | Status: DC

## 2022-05-23 NOTE — Anesthesia Postprocedure Evaluation (Signed)
Anesthesia Post Note  Patient: Violett H Muralles  Procedure(s) Performed: COLONOSCOPY WITH PROPOFOL ESOPHAGOGASTRODUODENOSCOPY (EGD) WITH PROPOFOL [place] PLACEMENT  Patient location during evaluation: Short Stay Anesthesia Type: General Level of consciousness: awake and alert Pain management: pain level controlled Vital Signs Assessment: post-procedure vital signs reviewed and stable Respiratory status: spontaneous breathing Cardiovascular status: blood pressure returned to baseline and stable Postop Assessment: no apparent nausea or vomiting Anesthetic complications: no   No notable events documented.   Last Vitals:  Vitals:   05/23/22 0712 05/23/22 0929  BP: (!) 142/78 (!) 159/81  Pulse: 77 89  Resp: 15 20  Temp: 36.7 C 36.4 C  SpO2: 97% 99%    Last Pain:  Vitals:   05/23/22 0929  TempSrc: Oral  PainSc: 0-No pain                 Raenell Mensing

## 2022-05-23 NOTE — Anesthesia Preprocedure Evaluation (Signed)
Anesthesia Evaluation  Patient identified by MRN, date of birth, ID band Patient awake    Reviewed: Allergy & Precautions, H&P , NPO status , Patient's Chart, lab work & pertinent test results, reviewed documented beta blocker date and time   Airway Mallampati: II  TM Distance: >3 FB Neck ROM: full    Dental no notable dental hx.    Pulmonary COPD, Current Smoker,    Pulmonary exam normal breath sounds clear to auscultation       Cardiovascular Exercise Tolerance: Good hypertension, negative cardio ROS   Rhythm:regular Rate:Normal     Neuro/Psych  Headaches, Seizures -,  PSYCHIATRIC DISORDERS Anxiety Depression  Neuromuscular disease    GI/Hepatic Neg liver ROS, GERD  Medicated,  Endo/Other  Hypothyroidism Morbid obesity  Renal/GU negative Renal ROS  negative genitourinary   Musculoskeletal   Abdominal   Peds  Hematology negative hematology ROS (+)   Anesthesia Other Findings   Reproductive/Obstetrics negative OB ROS                             Anesthesia Physical Anesthesia Plan  ASA: 3  Anesthesia Plan: General   Post-op Pain Management:    Induction:   PONV Risk Score and Plan: Propofol infusion  Airway Management Planned:   Additional Equipment:   Intra-op Plan:   Post-operative Plan:   Informed Consent: I have reviewed the patients History and Physical, chart, labs and discussed the procedure including the risks, benefits and alternatives for the proposed anesthesia with the patient or authorized representative who has indicated his/her understanding and acceptance.     Dental Advisory Given  Plan Discussed with: CRNA  Anesthesia Plan Comments:         Anesthesia Quick Evaluation
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
## 2022-05-23 NOTE — Op Note (Signed)
[facility] Patient Name: Elizabeth Brewer Procedure Date: 05/23/2022 8:10 AM MRN: [id number] Date of Birth: 1961-05-29 Attending MD: Norvel Richards , MD CSN: [id number] Age: 61 Admit Type: Outpatient Procedure:                Colonoscopy Indications:              Screening in patient at increased risk: Family                            history of 1st-degree relative with colorectal                            cancer before age 42 years Providers:                Norvel Richards, MD, Lambert Mody, Starla Link RN, RN, Raphael Gibney, Technician,                            Ladoris Gene Technician, Technician Referring MD:              Medicines:                Propofol per Anesthesia Complications:            No immediate complications. Estimated Blood Loss:     Estimated blood loss was minimal. Procedure:                Pre-Anesthesia Assessment:                           - Prior to the procedure, a History and Physical                            was performed, and patient medications and                            allergies were reviewed. The patient's tolerance of                            previous anesthesia was also reviewed. The risks                            and benefits of the procedure and the sedation                            options and risks were discussed with the patient.                            All questions were answered, and informed consent                            was obtained. Prior Anticoagulants: The patient has  taken no previous anticoagulant or antiplatelet                            agents. ASA Grade Assessment: III - A patient with                            severe systemic disease. After reviewing the risks                            and benefits, the patient was deemed in                            satisfactory condition to undergo the procedure.                            After obtaining informed consent, the colonoscope                            was passed under direct vision. Throughout the                            procedure, the patient's blood pressure, pulse, and                            oxygen saturations were monitored continuously. The                            [id number] scope was introduced through the                            anus and advanced to the the cecum, identified by                            appendiceal orifice and ileocecal valve. The                            colonoscopy was performed without difficulty. The                            patient tolerated the procedure well. The quality                            of the bowel preparation was adequate. Scope In: 8:39:54 AM Scope Out: 9:21:48 AM Scope Withdrawal Time: 0 hours 32 minutes 43 seconds  Total Procedure Duration: 0 hours 41 minutes 54 seconds  Findings:      The perianal and digital rectal examinations were normal. Somewhat       redundant and elongated colon requiring external abdominal pressure to       reach the cecum. Just distal to the ileocecal valve there was a 3 x 4 cm       balling carpet polyp spanning 2 folds. Please see photos. It was lifted       with 14 cc of Ella view nicely. Much better access after this maneuver  was employed. Subsequently, piecemeal hot snare polypectomy followed.       Multiple fragments submitted to the pathologist. Margins touched up with       the tip of the hot snare cautery unit. 2 areas of the polypectomy site       appeared to be slightly deep but no target sign. These 2 areas were       closed nicely with 2 ultra clips.      The remainder chronicus appeared normal there was some mushy stool       effluent in the transverse and left colon. It was washed and lavaged in       fairly good views were obtained.      The retroflexed view of the distal rectum and anal verge was normal and       showed no anal or rectal  abnormalities. Impression:               - The distal rectum and anal verge are normal on                            retroflexion view.                           -3 x 4 cm carpet polyp just distal to the ileocecal                            valve -removed piecemeal with clips. Moderate Sedation:      Moderate (conscious) sedation was personally administered by an       anesthesia professional. The following parameters were monitored: oxygen       saturation, heart rate, blood pressure, respiratory rate, EKG, adequacy       of pulmonary ventilation, and response to care. Recommendation:           - Patient has a contact number available for                            emergencies. The signs and symptoms of potential                            delayed complications were discussed with the                            patient. Return to normal activities tomorrow.                            Written discharge instructions were provided to the                            patient.                           - Advance diet as tolerated.                           - Continue present medications.                           -  Repeat colonoscopy in 6 months for surveillance.                            No MRI until clips gone.                           - Return to GI office in 6 months. Follow-up on                            pathology. Anticipate early repeat colonoscopy in 6                            months. Patient will need a little better prep. See                            EGD report. Procedure Code(s):        --- Professional ---                           [phone], Colonoscopy, flexible; diagnostic, including                            collection of specimen(s) by brushing or washing,                            when performed (separate procedure) Diagnosis Code(s):        --- Professional ---                           Z80.0, Family history of malignant neoplasm of                             digestive organs CPT copyright 2019 American Medical Association. All rights reserved. The codes documented in this report are preliminary and upon coder review may  be revised to meet current compliance requirements. Cristopher Estimable. Avrey Hyser, MD Norvel Richards, MD 05/23/2022 9:34:45 AM This report has been signed electronically. Number of Addenda: 0

## 2022-05-23 NOTE — Telephone Encounter (Signed)
-----   Message from Daneil Dolin, MD sent at 05/23/2022  9:26 AM EDT ----- We are increasing omeprazole to 40 mg pill 30 minutes before breakfast daily.  Dispense 30 with 11 refills.  Thanks.

## 2022-05-23 NOTE — Transfer of Care (Signed)
Immediate Anesthesia Transfer of Care Note  Patient: Elizabeth Brewer  Procedure(s) Performed: COLONOSCOPY WITH PROPOFOL ESOPHAGOGASTRODUODENOSCOPY (EGD) WITH PROPOFOL MALONEY DILATION POLYPECTOMY SUBMUCOSAL LIFTING INJECTION HEMOSTASIS CLIP PLACEMENT  Patient Location: Short Stay  Anesthesia Type:General  Level of Consciousness: awake  Airway & Oxygen Therapy: Patient Spontanous Breathing  Post-op Assessment: Report given to RN and Post -op Vital signs reviewed and stable  Post vital signs: Reviewed and stable  Last Vitals:  Vitals Value Taken Time  BP 159/81 05/23/22 0929  Temp 36.4 C 05/23/22 0929  Pulse 89 05/23/22 0929  Resp 20 05/23/22 0929  SpO2 99 % 05/23/22 0929    Last Pain:  Vitals:   05/23/22 0929  TempSrc: Oral  PainSc: 0-No pain      Patients Stated Pain Goal: 8 (92/92/44 [id number])  Complications: No notable events documented.

## 2022-05-23 NOTE — Op Note (Addendum)
[facility] Patient Name: Elizabeth Brewer Procedure Date: 05/23/2022 8:09 AM MRN: [id number] Date of Birth: 1961-08-09 Attending MD: Norvel Richards , MD CSN: [id number] Age: 61 Admit Type: Outpatient Procedure:                Upper GI endoscopy Indications:              Dysphagia Providers:                Norvel Richards, MD, Charlsie Quest. Theda Sers RN, RN,                            Lambert Mody, Raphael Gibney, Technician,                            Ladoris Gene Technician, Technician Referring MD:              Medicines:                Propofol per Anesthesia Complications:            No immediate complications. Estimated Blood Loss:     Estimated blood loss was minimal. Procedure:                Pre-Anesthesia Assessment:                           - Prior to the procedure, a History and Physical                            was performed, and patient medications and                            allergies were reviewed. The patient's tolerance of                            previous anesthesia was also reviewed. The risks                            and benefits of the procedure and the sedation                            options and risks were discussed with the patient.                            All questions were answered, and informed consent                            was obtained. Prior Anticoagulants: The patient has                            taken no previous anticoagulant or antiplatelet                            agents. ASA Grade Assessment: III - A patient with  severe systemic disease. After reviewing the risks                            and benefits, the patient was deemed in                            satisfactory condition to undergo the procedure.                           After obtaining informed consent, the endoscope was                            passed under direct vision. Throughout the                            procedure,  the patient's blood pressure, pulse, and                            oxygen saturations were monitored continuously. The                            [id number]-[id number] ([id number]) scope was introduced through the                            mouth, and advanced to the second part of duodenum.                            The upper GI endoscopy was accomplished without                            difficulty. The patient tolerated the procedure                            well. Scope In: 8:25:55 AM Scope Out: 8:33:21 AM Total Procedure Duration: 0 hours 7 minutes 26 seconds  Findings:      A mild Schatzki ring was found at the gastroesophageal junction.      A small hiatal hernia was present. Normal-appearing gastric mucosa.       Patent pylorus.      The duodenal bulb and second portion of the duodenum were normal. Impression:               - Mild Schatzki ring. Dilated.                           - Small hiatal hernia. Normal-appearing gastric                            mucosa.                           - Normal duodenal bulb and second portion of the                            duodenum.                           -  No specimens collected. Moderate Sedation:      Moderate (conscious) sedation was personally administered by an       anesthesia professional. The following parameters were monitored: oxygen       saturation, heart rate, blood pressure, respiratory rate, EKG, adequacy       of pulmonary ventilation, and response to care. Recommendation:           - Patient has a contact number available for                            emergencies. The signs and symptoms of potential                            delayed complications were discussed with the                            patient. Return to normal activities tomorrow.                            Written discharge instructions were provided to the                            patient.                           - Advance diet as tolerated. Increase  omeprazole to                            40 mg before breakfast daily. Office visit with [place]                            in 6 months. See colonoscopy report. Procedure Code(s):        --- Professional ---                           [phone], Esophagogastroduodenoscopy, flexible,                            transoral; diagnostic, including collection of                            specimen(s) by brushing or washing, when performed                            (separate procedure) Diagnosis Code(s):        --- Professional ---                           K22.2, Esophageal obstruction                           K44.9, Diaphragmatic hernia without obstruction or                            gangrene  R13.10, Dysphagia, unspecified CPT copyright 2019 American Medical Association. All rights reserved. The codes documented in this report are preliminary and upon coder review may  be revised to meet current compliance requirements. Cristopher Estimable. Derrika Ruffalo, MD Norvel Richards, MD 05/23/2022 9:28:36 AM This report has been signed electronically. Number of Addenda: 1 Addendum Number: 1   Addendum Date: 06/10/2022 5:51:07 PM      Not mentioned in the text of the note, a 29 Maloney was passed to full       insertion without resistance; subsequently, a 15 F maloney dilator was       passed to full insertion with mild resistance. A look back revealed no       apparent complication to these maneuvers. Blood loos minimal. Cristopher Estimable. Mycheal Veldhuizen, MD Norvel Richards, MD 06/10/2022 5:56:17 PM This report has been signed electronically.

## 2022-05-23 NOTE — Discharge Instructions (Signed)
Colonoscopy Discharge Instructions  Read the instructions outlined below and refer to this sheet in the next few weeks. These discharge instructions provide you with general information on caring for yourself after you leave the hospital. Your doctor may also give you specific instructions. While your treatment has been planned according to the most current medical practices available, unavoidable complications occasionally occur. If you have any problems or questions after discharge, call Dr. Gala Romney at [phone]. ACTIVITY You may resume your regular activity, but move at a slower pace for the next 24 hours.  Take frequent rest periods for the next 24 hours.  Walking will help get rid of the air and reduce the bloated feeling in your belly (abdomen).  No driving for 24 hours (because of the medicine (anesthesia) used during the test).   Do not sign any important legal documents or operate any machinery for 24 hours (because of the anesthesia used during the test).  NUTRITION Drink plenty of fluids.  You may resume your normal diet as instructed by your doctor.  Begin with a light meal and progress to your normal diet. Heavy or fried foods are harder to digest and may make you feel sick to your stomach (nauseated).  Avoid alcoholic beverages for 24 hours or as instructed.  MEDICATIONS You may resume your normal medications unless your doctor tells you otherwise.  WHAT YOU CAN EXPECT TODAY Some feelings of bloating in the abdomen.  Passage of more gas than usual.  Spotting of blood in your stool or on the toilet paper.  IF YOU HAD POLYPS REMOVED DURING THE COLONOSCOPY: No aspirin products for 7 days or as instructed.  No alcohol for 7 days or as instructed.  Eat a soft diet for the next 24 hours.  FINDING OUT THE RESULTS OF YOUR TEST Not all test results are available during your visit. If your test results are not back during the visit, make an appointment with your caregiver to find out the  results. Do not assume everything is normal if you have not heard from your caregiver or the medical facility. It is important for you to follow up on all of your test results.  SEEK IMMEDIATE MEDICAL ATTENTION IF: You have more than a spotting of blood in your stool.  Your belly is swollen (abdominal distention).  You are nauseated or vomiting.  You have a temperature over 101.  You have abdominal pain or discomfort that is severe or gets worse throughout the day.    EGD Discharge instructions Please read the instructions outlined below and refer to this sheet in the next few weeks. These discharge instructions provide you with general information on caring for yourself after you leave the hospital. Your doctor may also give you specific instructions. While your treatment has been planned according to the most current medical practices available, unavoidable complications occasionally occur. If you have any problems or questions after discharge, please call your doctor. ACTIVITY You may resume your regular activity but move at a slower pace for the next 24 hours.  Take frequent rest periods for the next 24 hours.  Walking will help expel (get rid of) the air and reduce the bloated feeling in your abdomen.  No driving for 24 hours (because of the anesthesia (medicine) used during the test).  You may shower.  Do not sign any important legal documents or operate any machinery for 24 hours (because of the anesthesia used during the test).  NUTRITION Drink plenty of fluids.  You  may resume your normal diet.  Begin with a light meal and progress to your normal diet.  Avoid alcoholic beverages for 24 hours or as instructed by your caregiver.  MEDICATIONS You may resume your normal medications unless your caregiver tells you otherwise.  WHAT YOU CAN EXPECT TODAY You may experience abdominal discomfort such as a feeling of fullness or "gas" pains.  FOLLOW-UP Your doctor will discuss the results  of your test with you.  SEEK IMMEDIATE MEDICAL ATTENTION IF ANY OF THE FOLLOWING OCCUR: Excessive nausea (feeling sick to your stomach) and/or vomiting.  Severe abdominal pain and distention (swelling).  Trouble swallowing.  Temperature over 101 F (37.8 C).  Rectal bleeding or vomiting of blood.    Narrowing in your esophagus related to reflux was stretched.  We will increase your omeprazole to 40 mg daily-new prescription has been called in through the office.   You had a large polyp in the right side of your colon removed.   clips placed to close the polypectomy site   No future MRI until clips no longer present (they will fall off on their own)   because of the size of the polyp and the nature of removal at a minimum, you will need a repeat colonoscopy in 6 months   further recommendations to follow pending review of pathology report   at patient request, I called Terrance Mass at [phone] findings and recommendations

## 2022-05-24 ENCOUNTER — Telehealth: Payer: Self-pay | Admitting: Internal Medicine

## 2022-05-24 ENCOUNTER — Other Ambulatory Visit: Payer: Self-pay

## 2022-05-24 MED ORDER — OMEPRAZOLE 40 MG PO CPDR: 40.0000 mg | DELAYED_RELEASE_CAPSULE | Freq: Every day | ORAL | 11 refills | Status: DC

## 2022-05-24 NOTE — Telephone Encounter (Signed)
Medication was sent to [facility]. Lm notifying patient

## 2022-05-24 NOTE — Telephone Encounter (Signed)
Spoke to patient to add to recall list for 6 months.  She asked me to pass along to you to see if the medication that was called in to [facility] could instead be sent in to [facility]?  She said t"the nurse should know what this is and how to send".

## 2022-05-25 LAB — SURGICAL PATHOLOGY

## 2022-05-26 ENCOUNTER — Encounter: Payer: Self-pay | Admitting: Internal Medicine

## 2022-05-31 ENCOUNTER — Other Ambulatory Visit: Payer: Self-pay | Admitting: Family

## 2022-06-14 ENCOUNTER — Other Ambulatory Visit: Payer: Self-pay | Admitting: *Deleted

## 2022-06-20 DIAGNOSIS — M545 Low back pain, unspecified: Secondary | ICD-10-CM | POA: Diagnosis not present

## 2022-06-20 DIAGNOSIS — M542 Cervicalgia: Secondary | ICD-10-CM | POA: Diagnosis not present

## 2022-06-20 DIAGNOSIS — G43109 Migraine with aura, not intractable, without status migrainosus: Secondary | ICD-10-CM | POA: Diagnosis not present

## 2022-06-20 DIAGNOSIS — E538 Deficiency of other specified B group vitamins: Secondary | ICD-10-CM | POA: Diagnosis not present

## 2022-06-22 ENCOUNTER — Encounter: Payer: Self-pay | Admitting: Family

## 2022-06-22 ENCOUNTER — Ambulatory Visit (INDEPENDENT_AMBULATORY_CARE_PROVIDER_SITE_OTHER): Payer: Medicare Other | Admitting: Family

## 2022-06-22 VITALS — BP 126/70 | HR 67 | Temp 97.8°F | Ht 61.0 in | Wt 220.0 lb

## 2022-06-22 DIAGNOSIS — F419 Anxiety disorder, unspecified: Secondary | ICD-10-CM

## 2022-06-22 DIAGNOSIS — F17200 Nicotine dependence, unspecified, uncomplicated: Secondary | ICD-10-CM

## 2022-06-22 DIAGNOSIS — F331 Major depressive disorder, recurrent, moderate: Secondary | ICD-10-CM

## 2022-06-22 DIAGNOSIS — F4321 Adjustment disorder with depressed mood: Secondary | ICD-10-CM

## 2022-06-22 DIAGNOSIS — I10 Essential (primary) hypertension: Secondary | ICD-10-CM

## 2022-06-22 MED ORDER — BUSPIRONE HCL 7.5 MG PO TABS: 7.5000 mg | ORAL_TABLET | Freq: Three times a day (TID) | ORAL | 1 refills | Status: DC | PRN

## 2022-06-22 NOTE — Progress Notes (Signed)
Subjective:    Patient ID: Elizabeth Brewer, female    DOB: [birth year], 61 y.o.   MRN: [id number]  Chief Complaint  Patient presents with   Hypertension    Having headaches and some dizzy. No vision changes.   Pt presents to the office today with headaches, dizziness, and fatigue that started three weeks ago. She thought it was related to her BP, but her BP is at goal. She reports she saw her Neurologists this week and her BP was 184/82.   She reports her stress is increased. Her adopted son came home from a group home in June. This has caused increased stress because he very vocal. He is 61 years old. He is suspended from school today because of being disrespectful to his teacher.   She is also grieving her husband who committed suicide Jun 16 2021.   She is followed by [facility] every 3 months. She is currently taking Abilify 15 mg daily, Celexa 40 mg daily, Klonopin 1 mg BID,  and doxepin 75 mg at bedtime.  Hypertension This is a chronic problem. The current episode started more than 1 year ago. The problem has been resolved since onset. The problem is uncontrolled. Associated symptoms include anxiety, malaise/fatigue and palpitations. Pertinent negatives include no peripheral edema or shortness of breath. Risk factors for coronary artery disease include dyslipidemia, obesity and sedentary lifestyle. The current treatment provides moderate improvement.  Anxiety Presents for follow-up visit. Symptoms include depressed mood, excessive worry, insomnia, irritability, nervous/anxious behavior, palpitations and restlessness. Patient reports no shortness of breath. Symptoms occur constantly. The severity of symptoms is severe.    Nicotine Dependence Presents for follow-up visit. Symptoms include insomnia and irritability. Her urge triggers include company of smokers. The symptoms have been stable. She smokes < 1/2 a pack of cigarettes per day.      Review of Systems  Constitutional:   Positive for irritability and malaise/fatigue.  Respiratory:  Negative for shortness of breath.   Cardiovascular:  Positive for palpitations.  Psychiatric/Behavioral:  The patient is nervous/anxious and has insomnia.   All other systems reviewed and are negative.      Objective:   Physical Exam Vitals reviewed.  Constitutional:      General: She is not in acute distress.    Appearance: She is well-developed. She is obese.  HENT:     Head: Normocephalic and atraumatic.     Right Ear: Tympanic membrane normal.     Left Ear: Tympanic membrane normal.  Eyes:     Pupils: Pupils are equal, round, and reactive to light.  Neck:     Thyroid: No thyromegaly.  Cardiovascular:     Rate and Rhythm: Normal rate and regular rhythm.     Heart sounds: Normal heart sounds. No murmur heard. Pulmonary:     Effort: Pulmonary effort is normal. No respiratory distress.     Breath sounds: Normal breath sounds. No wheezing.  Abdominal:     General: Bowel sounds are normal. There is no distension.     Palpations: Abdomen is soft.     Tenderness: There is no abdominal tenderness.  Musculoskeletal:        General: No tenderness. Normal range of motion.     Cervical back: Normal range of motion and neck supple.  Skin:    General: Skin is warm and dry.  Neurological:     Mental Status: She is alert and oriented to person, place, and time.     Cranial Nerves:  No cranial nerve deficit.     Deep Tendon Reflexes: Reflexes are normal and symmetric.  Psychiatric:        Mood and Affect: Mood is anxious. Affect is tearful.        Behavior: Behavior normal.        Thought Content: Thought content normal.        Judgment: Judgment normal.       BP 126/70   Pulse 67   Temp 97.8 F (36.6 C) (Temporal)   Ht '5\' 1"'$  (1.549 m)   Wt 220 lb (99.8 kg)   SpO2 98%   BMI 41.57 kg/m      Assessment & Plan:  Elizabeth Brewer comes in today with chief complaint of Hypertension (Having headaches and some  dizzy. No vision changes.)   Diagnosis and orders addressed:  1. Grief - busPIRone (BUSPAR) 7.5 MG tablet; Take 1 tablet (7.5 mg total) by mouth 3 (three) times daily as needed.  Dispense: 90 tablet; Refill: 1  2. Moderate episode of recurrent major depressive disorder ([place])  3. TOBACCO ABUSE  4. Anxiety - busPIRone (BUSPAR) 7.5 MG tablet; Take 1 tablet (7.5 mg total) by mouth 3 (three) times daily as needed.  Dispense: 90 tablet; Refill: 1  5. Morbid obesity ([place])  6. Essential hypertension   Gen[facility] testing performed today Call [facility] to make follow up appt  Will add Buspar 7.5 mg TID prn  Stress management  Health Maintenance reviewed Diet and exercise encouraged  Follow up plan: Will call with [facility] results    [name], FNP

## 2022-06-22 NOTE — Patient Instructions (Signed)

## 2022-06-28 ENCOUNTER — Other Ambulatory Visit: Payer: Self-pay | Admitting: *Deleted

## 2022-06-28 MED ORDER — TOPIRAMATE 200 MG PO TABS: 200.0000 mg | ORAL_TABLET | Freq: Every day | ORAL | 1 refills | Status: DC

## 2022-07-10 ENCOUNTER — Other Ambulatory Visit: Payer: Self-pay | Admitting: Family

## 2022-07-10 DIAGNOSIS — E876 Hypokalemia: Secondary | ICD-10-CM

## 2022-07-13 ENCOUNTER — Ambulatory Visit: Payer: Medicare Other | Admitting: Family

## 2022-07-17 ENCOUNTER — Ambulatory Visit: Payer: Medicare Other | Admitting: Family

## 2022-08-02 ENCOUNTER — Other Ambulatory Visit: Payer: Self-pay | Admitting: Family

## 2022-08-02 DIAGNOSIS — F419 Anxiety disorder, unspecified: Secondary | ICD-10-CM

## 2022-08-02 DIAGNOSIS — F4321 Adjustment disorder with depressed mood: Secondary | ICD-10-CM

## 2022-09-05 ENCOUNTER — Other Ambulatory Visit: Payer: Self-pay | Admitting: Family

## 2022-09-19 DIAGNOSIS — E538 Deficiency of other specified B group vitamins: Secondary | ICD-10-CM | POA: Diagnosis not present

## 2022-09-19 DIAGNOSIS — M542 Cervicalgia: Secondary | ICD-10-CM | POA: Diagnosis not present

## 2022-09-19 DIAGNOSIS — M25512 Pain in left shoulder: Secondary | ICD-10-CM | POA: Diagnosis not present

## 2022-09-19 DIAGNOSIS — G43109 Migraine with aura, not intractable, without status migrainosus: Secondary | ICD-10-CM | POA: Diagnosis not present

## 2022-09-19 DIAGNOSIS — Z79899 Other long term (current) drug therapy: Secondary | ICD-10-CM | POA: Diagnosis not present

## 2022-09-30 ENCOUNTER — Other Ambulatory Visit: Payer: Self-pay | Admitting: Family

## 2022-09-30 DIAGNOSIS — E876 Hypokalemia: Secondary | ICD-10-CM

## 2022-10-04 ENCOUNTER — Encounter: Payer: Self-pay | Admitting: Internal Medicine

## 2022-10-11 ENCOUNTER — Other Ambulatory Visit: Payer: Self-pay | Admitting: Family

## 2022-10-11 DIAGNOSIS — F419 Anxiety disorder, unspecified: Secondary | ICD-10-CM

## 2022-10-11 DIAGNOSIS — F4321 Adjustment disorder with depressed mood: Secondary | ICD-10-CM

## 2022-10-15 ENCOUNTER — Encounter: Payer: Self-pay | Admitting: Family

## 2022-10-15 ENCOUNTER — Ambulatory Visit (INDEPENDENT_AMBULATORY_CARE_PROVIDER_SITE_OTHER): Payer: Medicare Other | Admitting: Family

## 2022-10-15 VITALS — BP 153/79 | HR 72 | Temp 97.8°F | Ht 61.0 in | Wt 221.8 lb

## 2022-10-15 DIAGNOSIS — F419 Anxiety disorder, unspecified: Secondary | ICD-10-CM

## 2022-10-15 DIAGNOSIS — E559 Vitamin D deficiency, unspecified: Secondary | ICD-10-CM

## 2022-10-15 DIAGNOSIS — Z122 Encounter for screening for malignant neoplasm of respiratory organs: Secondary | ICD-10-CM

## 2022-10-15 DIAGNOSIS — Z79899 Other long term (current) drug therapy: Secondary | ICD-10-CM

## 2022-10-15 DIAGNOSIS — J449 Chronic obstructive pulmonary disease, unspecified: Secondary | ICD-10-CM

## 2022-10-15 DIAGNOSIS — K5900 Constipation, unspecified: Secondary | ICD-10-CM | POA: Diagnosis not present

## 2022-10-15 DIAGNOSIS — I10 Essential (primary) hypertension: Secondary | ICD-10-CM

## 2022-10-15 DIAGNOSIS — F331 Major depressive disorder, recurrent, moderate: Secondary | ICD-10-CM

## 2022-10-15 DIAGNOSIS — F17200 Nicotine dependence, unspecified, uncomplicated: Secondary | ICD-10-CM

## 2022-10-15 DIAGNOSIS — E785 Hyperlipidemia, unspecified: Secondary | ICD-10-CM | POA: Diagnosis not present

## 2022-10-15 DIAGNOSIS — F17210 Nicotine dependence, cigarettes, uncomplicated: Secondary | ICD-10-CM

## 2022-10-15 DIAGNOSIS — K219 Gastro-esophageal reflux disease without esophagitis: Secondary | ICD-10-CM

## 2022-10-15 DIAGNOSIS — G959 Disease of spinal cord, unspecified: Secondary | ICD-10-CM

## 2022-10-15 DIAGNOSIS — E039 Hypothyroidism, unspecified: Secondary | ICD-10-CM | POA: Diagnosis not present

## 2022-10-15 DIAGNOSIS — Z6841 Body Mass Index (BMI) 40.0 and over, adult: Secondary | ICD-10-CM

## 2022-10-15 MED ORDER — FOLIC ACID 1 MG PO TABS: 3.0000 mg | ORAL_TABLET | Freq: Every day | ORAL | 1 refills | Status: DC

## 2022-10-15 MED ORDER — LEVOTHYROXINE SODIUM 112 MCG PO TABS: 112.0000 ug | ORAL_TABLET | Freq: Every day | ORAL | 3 refills | Status: DC

## 2022-10-15 NOTE — Patient Instructions (Signed)

## 2022-10-15 NOTE — Progress Notes (Addendum)
Subjective:    Patient ID: Elizabeth Brewer, female    DOB: 1960/10/23, 62 y.o.   MRN: [id number]  Chief Complaint  Patient presents with   Medical Management of Chronic Issues   PT presents to the office today for chronic follow up. PT is followed by a neurologists every 3 months for chronic neck pain,seizure, and migraines. These are stable at this time.    She is followed by [facility] every 3 months.     She states her brother died in November 06, 2022 and her husband shot himself. She has had a great deal of anxiety with this.    She has COPD and continues to smoke 1 pack a day.    She is morbid obese with a BMI of 41 and HTN and Hyperlipidemia. Hypertension This is a chronic problem. The current episode started more than 1 year ago. The problem is unchanged. The problem is uncontrolled. Associated symptoms include anxiety, malaise/fatigue and shortness of breath. Pertinent negatives include no peripheral edema. Risk factors for coronary artery disease include dyslipidemia and obesity. Identifiable causes of hypertension include a thyroid problem.  Gastroesophageal Reflux She complains of belching, heartburn and a hoarse voice. This is a chronic problem. The current episode started more than 1 year ago. The problem occurs occasionally. Associated symptoms include fatigue. Risk factors include obesity. She has tried a PPI for the symptoms. The treatment provided moderate relief.  Thyroid Problem Presents for follow-up visit. Symptoms include anxiety, depressed mood, fatigue and hoarse voice. Patient reports no constipation. The symptoms have been stable. Her past medical history is significant for hyperlipidemia.  Hyperlipidemia This is a chronic problem. The current episode started more than 1 year ago. Exacerbating diseases include obesity. Associated symptoms include shortness of breath. Current antihyperlipidemic treatment includes statins. The current treatment provides moderate  improvement of lipids. Risk factors for coronary artery disease include dyslipidemia, hypertension, post-menopausal and a sedentary lifestyle.  Depression        This is a chronic problem.  The current episode started more than 1 year ago.   The onset quality is gradual.   The problem occurs intermittently.  Associated symptoms include fatigue, helplessness, hopelessness, irritable, restlessness and sad.  Past medical history includes thyroid problem and anxiety.   Anxiety Presents for follow-up visit. Symptoms include depressed mood, excessive worry, irritability, nervous/anxious behavior, restlessness and shortness of breath. Symptoms occur most days. The severity of symptoms is moderate.        Review of Systems  Constitutional:  Positive for fatigue, irritability and malaise/fatigue.  HENT:  Positive for hoarse voice.   Respiratory:  Positive for shortness of breath.   Gastrointestinal:  Positive for heartburn. Negative for constipation.  Psychiatric/Behavioral:  Positive for depression. The patient is nervous/anxious.   All other systems reviewed and are negative.      Objective:   Physical Exam Vitals reviewed.  Constitutional:      General: She is irritable. She is not in acute distress.    Appearance: She is well-developed. She is obese.  HENT:     Head: Normocephalic and atraumatic.     Right Ear: Tympanic membrane normal.     Left Ear: Tympanic membrane normal.  Eyes:     Pupils: Pupils are equal, round, and reactive to light.  Neck:     Thyroid: No thyromegaly.  Cardiovascular:     Rate and Rhythm: Normal rate and regular rhythm.     Heart sounds: Normal heart sounds. No murmur heard.  Pulmonary:     Effort: Pulmonary effort is normal. No respiratory distress.     Breath sounds: Normal breath sounds. No wheezing.  Abdominal:     General: Bowel sounds are normal. There is no distension.     Palpations: Abdomen is soft.     Tenderness: There is no abdominal  tenderness.  Musculoskeletal:        General: No tenderness. Normal range of motion.     Cervical back: Normal range of motion and neck supple.  Skin:    General: Skin is warm and dry.  Neurological:     Mental Status: She is alert and oriented to person, place, and time.     Cranial Nerves: No cranial nerve deficit.     Deep Tendon Reflexes: Reflexes are normal and symmetric.  Psychiatric:        Mood and Affect: Mood is anxious.        Behavior: Behavior normal.        Thought Content: Thought content normal.        Judgment: Judgment normal.       BP (!) 153/79   Pulse 72   Temp 97.8 F (36.6 C) (Temporal)   Ht 5' 1"$  (1.549 m)   Wt 221 lb 12.8 oz (100.6 kg)   SpO2 91%   BMI 41.91 kg/m      Assessment & Plan:  Elizabeth Brewer comes in today with chief complaint of Medical Management of Chronic Issues   Diagnosis and orders addressed:  1. Anxiety - CMP14+EGFR - CBC with Differential/Platelet  2. Cervical myelopathy (HCC) - CMP14+EGFR - CBC with Differential/Platelet  3. Constipation, unspecified constipation type - CMP14+EGFR - CBC with Differential/Platelet  4. Controlled substance agreement signed - CMP14+EGFR - CBC with Differential/Platelet  5. Chronic obstructive pulmonary disease, unspecified COPD type ([place]) - CMP14+EGFR - CBC with Differential/Platelet  6. Moderate episode of recurrent major depressive disorder (HCC) - CMP14+EGFR - CBC with Differential/Platelet  7. Essential hypertension - CMP14+EGFR - CBC with Differential/Platelet  8. Gastroesophageal reflux disease, unspecified whether esophagitis present - CMP14+EGFR - CBC with Differential/Platelet  9. Hyperlipidemia, unspecified hyperlipidemia type - CMP14+EGFR - CBC with Differential/Platelet  10. Hypothyroidism, unspecified type - levothyroxine (SYNTHROID) 112 MCG tablet; Take 1 tablet (112 mcg total) by mouth daily.  Dispense: 90 tablet; Refill: 3 - CMP14+EGFR - CBC with  Differential/Platelet - TSH  11. Morbid obesity ([place])  - CMP14+EGFR - CBC with Differential/Platelet  12. TOBACCO ABUSE - CMP14+EGFR - CBC with Differential/Platelet  13. Vitamin D deficiency - CMP14+EGFR - CBC with Differential/Platelet  14. Encounter for screening for lung cancer - CT CHEST LUNG CA SCREEN LOW DOSE W/O CM; Future   Labs pending Continue medications and keep follow up with with specialists  Health Maintenance reviewed Diet and exercise encouraged  Follow up plan: 3 months   [name], FNP

## 2022-10-16 ENCOUNTER — Other Ambulatory Visit: Payer: Self-pay | Admitting: Family

## 2022-10-16 DIAGNOSIS — I10 Essential (primary) hypertension: Secondary | ICD-10-CM

## 2022-10-16 LAB — CBC WITH DIFFERENTIAL/PLATELET
Basophils Absolute: 0.1 10*3/uL (ref 0.0–0.2)
Basos: 1 %
EOS (ABSOLUTE): 0.1 10*3/uL (ref 0.0–0.4)
Eos: 1 %
Hematocrit: 41.6 % (ref 34.0–46.6)
Hemoglobin: 14.1 g/dL (ref 11.1–15.9)
Immature Grans (Abs): 0.1 10*3/uL (ref 0.0–0.1)
Immature Granulocytes: 1 %
Lymphocytes Absolute: 1.9 10*3/uL (ref 0.7–3.1)
Lymphs: 19 %
MCH: 32.2 pg (ref 26.6–33.0)
MCHC: 33.9 g/dL (ref 31.5–35.7)
MCV: 95 fL (ref 79–97)
Monocytes Absolute: 0.5 10*3/uL (ref 0.1–0.9)
Monocytes: 5 %
Neutrophils Absolute: 7.5 10*3/uL — ABNORMAL HIGH (ref 1.4–7.0)
Neutrophils: 73 %
Platelets: 241 10*3/uL (ref 150–450)
RBC: 4.38 x10E6/uL (ref 3.77–5.28)
RDW: 13.5 % (ref 11.7–15.4)
WBC: 10.2 10*3/uL (ref 3.4–10.8)

## 2022-10-16 LAB — CMP14+EGFR
ALT: 16 IU/L (ref 0–32)
AST: 15 IU/L (ref 0–40)
Albumin/Globulin Ratio: 1.8 (ref 1.2–2.2)
Albumin: 4.4 g/dL (ref 3.9–4.9)
Alkaline Phosphatase: 106 IU/L (ref 44–121)
BUN/Creatinine Ratio: 11 — ABNORMAL LOW (ref 12–28)
BUN: 9 mg/dL (ref 8–27)
Bilirubin Total: 0.4 mg/dL (ref 0.0–1.2)
CO2: 25 mmol/L (ref 20–29)
Calcium: 9.2 mg/dL (ref 8.7–10.3)
Chloride: 96 mmol/L (ref 96–106)
Creatinine, Ser: 0.82 mg/dL (ref 0.57–1.00)
Globulin, Total: 2.5 g/dL (ref 1.5–4.5)
Glucose: 150 mg/dL — ABNORMAL HIGH (ref 70–99)
Potassium: 3.4 mmol/L — ABNORMAL LOW (ref 3.5–5.2)
Sodium: 138 mmol/L (ref 134–144)
Total Protein: 6.9 g/dL (ref 6.0–8.5)
eGFR: 81 mL/min/{1.73_m2} (ref >59)

## 2022-10-16 LAB — TSH: TSH: 62.9 u[IU]/mL — ABNORMAL HIGH (ref 0.450–4.500)

## 2022-10-26 ENCOUNTER — Telehealth: Payer: Self-pay | Admitting: Internal Medicine

## 2022-10-26 NOTE — Telephone Encounter (Signed)
When she comes in for OV if needed it will be scheduled then

## 2022-10-26 NOTE — Telephone Encounter (Signed)
Patient called to say she had a colonoscopy 6 months ago and Dr. Gala Romney wanted her to return and have another one done in March.  I see where a recall letter was sent to her for a follow up office visit after procedure but nothing to say she needs another colonoscopy.  Would he want to do another in 6 months?

## 2022-10-27 ENCOUNTER — Ambulatory Visit (HOSPITAL_BASED_OUTPATIENT_CLINIC_OR_DEPARTMENT_OTHER)
Admission: RE | Admit: 2022-10-27 | Discharge: 2022-10-27 | Disposition: A | Discharge status: Home / Self Care | Payer: Medicare Other | Source: Ambulatory Visit | Attending: Family | Admitting: Family

## 2022-10-27 DIAGNOSIS — I700 Atherosclerosis of aorta: Secondary | ICD-10-CM | POA: Insufficient documentation

## 2022-10-27 DIAGNOSIS — Z87891 Personal history of nicotine dependence: Secondary | ICD-10-CM | POA: Diagnosis not present

## 2022-10-27 DIAGNOSIS — J439 Emphysema, unspecified: Secondary | ICD-10-CM | POA: Diagnosis not present

## 2022-10-27 DIAGNOSIS — Z122 Encounter for screening for malignant neoplasm of respiratory organs: Secondary | ICD-10-CM | POA: Diagnosis not present

## 2022-10-27 DIAGNOSIS — F17210 Nicotine dependence, cigarettes, uncomplicated: Secondary | ICD-10-CM | POA: Diagnosis not present

## 2022-10-29 ENCOUNTER — Other Ambulatory Visit: Payer: Self-pay | Admitting: Family

## 2022-10-29 DIAGNOSIS — I700 Atherosclerosis of aorta: Secondary | ICD-10-CM

## 2022-11-01 ENCOUNTER — Telehealth: Payer: Self-pay | Admitting: Family

## 2022-11-25 ENCOUNTER — Other Ambulatory Visit: Payer: Self-pay | Admitting: Family

## 2022-11-27 ENCOUNTER — Other Ambulatory Visit (HOSPITAL_COMMUNITY): Payer: Self-pay | Admitting: Family

## 2022-11-27 DIAGNOSIS — Z1231 Encounter for screening mammogram for malignant neoplasm of breast: Secondary | ICD-10-CM

## 2022-11-28 ENCOUNTER — Encounter: Payer: Self-pay | Admitting: Family Medicine

## 2022-11-28 ENCOUNTER — Ambulatory Visit (INDEPENDENT_AMBULATORY_CARE_PROVIDER_SITE_OTHER): Payer: Medicare Other | Admitting: Family Medicine

## 2022-11-28 VITALS — BP 147/69 | HR 82 | Ht 61.0 in | Wt 219.0 lb

## 2022-11-28 DIAGNOSIS — B3781 Candidal esophagitis: Secondary | ICD-10-CM

## 2022-11-28 DIAGNOSIS — B370 Candidal stomatitis: Secondary | ICD-10-CM

## 2022-11-28 DIAGNOSIS — R490 Dysphonia: Secondary | ICD-10-CM | POA: Diagnosis not present

## 2022-11-28 MED ORDER — NYSTATIN 100000 UNIT/ML MT SUSP: 5.0000 mL | Freq: Four times a day (QID) | OROMUCOSAL | 0 refills | Status: DC

## 2022-11-28 NOTE — Progress Notes (Signed)
BP (!) 147/69   Pulse 82   Ht 5\' 1"  (1.549 m)   Wt 99.3 kg   SpO2 98%   BMI 41.38 kg/m    Subjective:   Patient ID: Elizabeth Brewer, female    DOB: 04/24/1961, 62 y.o.   MRN: [id number]  HPI: TONETTA Brewer is a 62 y.o. female presenting on 11/28/2022 for Sore Throat  Patient presents today with hoarseness x 6 weeks. Patient states she has had a dry mouth and food has tasted like cardboard with this. Patient states she has had sore throat with this for the past two weeks that went away. Problem is not worse at night. Patient has a history of allergies and GERD. Patient states she has not had acid reflux symptoms and this is not exacerbated by caffeine, chocolate, spicy, or acidic foods. Patient has tried Claritin, Pepto, and Xyzal without relief. Patient does use an inhaler and does not rinse her mouth out afterwards. Patient has not had a recent URI and has not been around anyone sick. Patient is a prior smoker. Pain 0/10.  Relevant past medical, surgical, family and social history were reviewed and updated as indicated. Interim medical history were reviewed. Allergies and medications were reviewed and updated.  Review of Systems  Constitutional:  Negative for chills and fever.  HENT:  Positive for sore throat and voice change. Negative for congestion, ear discharge, ear pain, mouth sores, sinus pressure, sinus pain, sneezing and trouble swallowing.   Eyes:  Negative for pain, discharge, redness and itching.  Respiratory:  Positive for wheezing. Negative for chest tightness and shortness of breath.   Cardiovascular:  Negative for chest pain.  Gastrointestinal:  Negative for abdominal pain, constipation, nausea and vomiting.  Musculoskeletal:  Negative for myalgias.  Skin:  Negative for rash and wound.  All other systems reviewed and are negative.   Per HPI unless specifically indicated above.   Allergies as of 11/28/2022   No Known Allergies      Medication List         Accurate as of November 28, 2022  3:20 PM. If you have any questions, ask your nurse or doctor.          ARIPiprazole 15 MG tablet Commonly known as: ABILIFY Take 15 mg by mouth at bedtime.   busPIRone 7.5 MG tablet Commonly known as: BUSPAR TAKE ONE TABLET BY MOUTH THREE TIMES DAILY AS NEEDED   citalopram 40 MG tablet Commonly known as: CELEXA TAKE ONE (1) TABLET EACH DAY   clonazePAM 1 MG tablet Commonly known as: KLONOPIN TAKE 1 TABLET DAILY AS NEEDED FOR ANXIETY   cyanocobalamin 1000 MCG/ML injection Commonly known as: VITAMIN B12 Inject 1,000 mcg into the muscle every 30 (thirty) days.   doxepin 75 MG capsule Commonly known as: SINEQUAN Take 75 mg by mouth at bedtime.   fluticasone furoate-vilanterol 100-25 MCG/ACT Aepb Commonly known as: BREO ELLIPTA Inhale 1 puff into the lungs daily.   folic acid 1 MG tablet Commonly known as: FOLVITE TAKE 3 TABLETS BY MOUTH DAILY   furosemide 20 MG tablet Commonly known as: LASIX TAKE ONE (1) TABLET BY MOUTH EVERY DAY What changed:  how much to take when to take this reasons to take this   gabapentin 300 MG capsule Commonly known as: NEURONTIN Take 300 mg by mouth 3 (three) times daily.   ketoconazole 2 % cream Commonly known as: NIZORAL Apply 1 Application topically daily as needed for irritation.   levothyroxine 112  MCG tablet Commonly known as: SYNTHROID Take 1 tablet (112 mcg total) by mouth daily.   multivitamin with minerals Tabs tablet Take 1 tablet by mouth daily.   nystatin 100000 UNIT/ML suspension Commonly known as: MYCOSTATIN Take 5 mLs (500,000 Units total) by mouth 4 (four) times daily. Started by: Worthy Rancher, MD   omeprazole 40 MG capsule Commonly known as: PRILOSEC Take 1 capsule (40 mg total) by mouth daily.   oxyCODONE 5 MG immediate release tablet Commonly known as: Oxy IR/ROXICODONE Take 5 mg by mouth 2 (two) times daily. What changed: Another medication with the same name was  removed. Continue taking this medication, and follow the directions you see here. Changed by: Fransisca Kaufmann Lynnley Doddridge, MD   polyethylene glycol 17 g packet Commonly known as: MIRALAX / GLYCOLAX Take 17 g by mouth daily as needed for moderate constipation.   potassium chloride SA 20 MEQ tablet Commonly known as: KLOR-CON M TAKE 1 TABLET BY MOUTH TWICE  DAILY   rosuvastatin 20 MG tablet Commonly known as: CRESTOR TAKE 1 TABLET BY MOUTH IN THE  EVENING   SUMAtriptan 100 MG tablet Commonly known as: IMITREX Take 100 mg by mouth every 2 (two) hours as needed for migraine.   topiramate 200 MG tablet Commonly known as: TOPAMAX TAKE 1 TABLET BY MOUTH DAILY         Objective:   BP (!) 147/69   Pulse 82   Ht 5\' 1"  (1.549 m)   Wt 99.3 kg   SpO2 98%   BMI 41.38 kg/m   Wt Readings from Last 3 Encounters:  11/28/22 99.3 kg  10/15/22 100.6 kg  06/22/22 99.8 kg    Physical Exam Vitals reviewed.  Constitutional:      Appearance: She is well-developed.  HENT:     Head: Normocephalic and atraumatic.     Right Ear: Tympanic membrane and ear canal normal.     Left Ear: Tympanic membrane and ear canal normal.     Nose: No congestion or rhinorrhea.     Mouth/Throat:     Mouth: Mucous membranes are dry.     Pharynx: Uvula midline. Oropharyngeal exudate (white, cottage-cheese like lesions that can be scraped off) and posterior oropharyngeal erythema present.     Tonsils: Tonsillar exudate (white, cottage-cheese like lesions that can be scraped off) present.  Eyes:     Conjunctiva/sclera: Conjunctivae normal.  Neck:     Thyroid: No thyroid mass or thyromegaly.  Cardiovascular:     Rate and Rhythm: Normal rate and regular rhythm.     Heart sounds: Normal heart sounds.  Pulmonary:     Effort: Pulmonary effort is normal.     Breath sounds: Normal breath sounds.  Musculoskeletal:     Cervical back: Neck supple.  Lymphadenopathy:     Cervical: No cervical adenopathy.  Skin:     General: Skin is warm and dry.  Neurological:     Mental Status: She is alert and oriented to person, place, and time.     Gait: Gait normal.  Psychiatric:        Mood and Affect: Mood normal.        Behavior: Behavior normal.    Assessment & Plan:   Problem List Items Addressed This Visit   None Visit Diagnoses     Thrush of mouth and esophagus (HCC)    -  Primary   Relevant Medications   nystatin (MYCOSTATIN) 100000 UNIT/ML suspension   Hoarseness of voice  Relevant Medications   nystatin (MYCOSTATIN) 100000 UNIT/ML suspension     Patient was prescribed nystatin suspension and instructed to take 5 mL QID. Patient also told to wash her mouth out after using her inhaler.  Follow up plan: Return if symptoms worsen or fail to improve.  Counseling provided for all of the vaccine components No orders of the defined types were placed in this encounter.   Summer Berlin, PA-S2 [place] 11/28/2022, 3:20 PM   Patient seen and examined with PA student, agree with assessment and plan above.  Do think it looks like thrush on exam in the posterior oropharynx and on the palate and likely due to inhaler use, will give thrush treatment and then evaluate if returns in the future. Vonna Kotyk [facility]ren [facility] 11/28/2022, 3:20 PM

## 2022-12-11 ENCOUNTER — Encounter: Payer: Self-pay | Admitting: *Deleted

## 2022-12-11 ENCOUNTER — Telehealth: Payer: Self-pay | Admitting: *Deleted

## 2022-12-11 ENCOUNTER — Ambulatory Visit: Payer: Medicare Other | Admitting: Internal Medicine

## 2022-12-11 VITALS — BP 148/71 | HR 76 | Temp 97.5°F | Ht 62.0 in | Wt 221.6 lb

## 2022-12-11 DIAGNOSIS — Z86010 Personal history of colonic polyps: Secondary | ICD-10-CM

## 2022-12-11 DIAGNOSIS — Z800 Family history of malignant neoplasm of digestive organs: Secondary | ICD-10-CM | POA: Diagnosis not present

## 2022-12-11 DIAGNOSIS — K5903 Drug induced constipation: Secondary | ICD-10-CM | POA: Diagnosis not present

## 2022-12-11 MED ORDER — PEG 3350-KCL-NA BICARB-NACL 420 G PO SOLR: 4000.0000 mL | Freq: Once | ORAL | 0 refills | Status: AC

## 2022-12-11 NOTE — Telephone Encounter (Signed)
Per [facility] "Notification or Prior Authorization is not required for the requested services You are not required to submit a notification/prior authorization based on the information provided. The number above acknowledges your inquiry and our response. Please write this number down and refer to it for future inquiries. If you still wish to submit your request for review, please select the Continue with Submission button below. Decision ID #: [id number]"

## 2022-12-11 NOTE — Patient Instructions (Signed)
Good to see you again today!  As discussed, you are due for colonoscopy now.  It is important that your colon be completely cleaned out.  We will schedule a follow-up colonoscopy ASA 3.  Needed 2-day, double prep.  Further recommendations to follow.
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
## 2022-12-13 ENCOUNTER — Other Ambulatory Visit: Payer: Self-pay | Admitting: Family

## 2022-12-13 ENCOUNTER — Encounter: Payer: Self-pay | Admitting: *Deleted

## 2022-12-13 DIAGNOSIS — I10 Essential (primary) hypertension: Secondary | ICD-10-CM

## 2022-12-17 ENCOUNTER — Ambulatory Visit (INDEPENDENT_AMBULATORY_CARE_PROVIDER_SITE_OTHER): Payer: Medicare Other

## 2022-12-17 ENCOUNTER — Other Ambulatory Visit: Payer: Self-pay | Admitting: Family

## 2022-12-17 ENCOUNTER — Ambulatory Visit (INDEPENDENT_AMBULATORY_CARE_PROVIDER_SITE_OTHER): Payer: Medicare Other | Admitting: Family

## 2022-12-17 ENCOUNTER — Encounter: Payer: Self-pay | Admitting: Family

## 2022-12-17 VITALS — BP 143/75 | HR 83 | Temp 98.1°F | Ht 62.0 in | Wt 222.0 lb

## 2022-12-17 DIAGNOSIS — M8589 Other specified disorders of bone density and structure, multiple sites: Secondary | ICD-10-CM | POA: Diagnosis not present

## 2022-12-17 DIAGNOSIS — E039 Hypothyroidism, unspecified: Secondary | ICD-10-CM

## 2022-12-17 DIAGNOSIS — I10 Essential (primary) hypertension: Secondary | ICD-10-CM | POA: Diagnosis not present

## 2022-12-17 DIAGNOSIS — Z780 Asymptomatic menopausal state: Secondary | ICD-10-CM

## 2022-12-17 MED ORDER — LISINOPRIL 20 MG PO TABS
20.0000 mg | ORAL_TABLET | Freq: Every day | ORAL | 3 refills | Status: DC
Start: 2022-12-17 — End: 2023-02-18

## 2022-12-17 NOTE — Progress Notes (Signed)
Subjective:    Patient ID: Elizabeth Brewer, female    DOB: May 09, 1961, 62 y.o.   MRN: [id number]  Chief Complaint  Patient presents with   Hypothyroidism   Pt presents to the office today to follow up on abnormal TSH. She was seen on 10/15/22 and had her TSH 62.9 she has been taking Levothyroxine 112 mcg.  Thyroid Problem Presents for follow-up visit. Symptoms include anxiety, dry skin and fatigue. Patient reports no constipation or diarrhea. The symptoms have been stable.  Hypertension This is a chronic problem. The current episode started more than 1 year ago. The problem is unchanged. The problem is uncontrolled. Associated symptoms include malaise/fatigue. Pertinent negatives include no peripheral edema or shortness of breath. Risk factors for coronary artery disease include dyslipidemia, obesity and sedentary lifestyle. Identifiable causes of hypertension include a thyroid problem.  Nicotine Dependence Presents for follow-up visit. Symptoms include fatigue. Her urge triggers include company of smokers. The symptoms have been stable. She smokes 1 pack of cigarettes per day.      Review of Systems  Constitutional:  Positive for fatigue and malaise/fatigue.  Respiratory:  Negative for shortness of breath.   Gastrointestinal:  Negative for constipation and diarrhea.  Psychiatric/Behavioral:  The patient is nervous/anxious.   All other systems reviewed and are negative.      Objective:   Physical Exam Vitals reviewed.  Constitutional:      General: She is not in acute distress.    Appearance: She is well-developed. She is obese.  HENT:     Head: Normocephalic and atraumatic.     Right Ear: Tympanic membrane normal.     Left Ear: Tympanic membrane normal.  Eyes:     Pupils: Pupils are equal, round, and reactive to light.  Neck:     Thyroid: No thyromegaly.  Cardiovascular:     Rate and Rhythm: Normal rate and regular rhythm.     Heart sounds: Normal heart sounds. No  murmur heard. Pulmonary:     Effort: Pulmonary effort is normal. No respiratory distress.     Breath sounds: Rhonchi present. No wheezing.  Abdominal:     General: Bowel sounds are normal. There is no distension.     Palpations: Abdomen is soft.     Tenderness: There is no abdominal tenderness.  Musculoskeletal:        General: No tenderness. Normal range of motion.     Cervical back: Normal range of motion and neck supple.  Skin:    General: Skin is warm and dry.  Neurological:     Mental Status: She is alert and oriented to person, place, and time.     Cranial Nerves: No cranial nerve deficit.     Deep Tendon Reflexes: Reflexes are normal and symmetric.  Psychiatric:        Behavior: Behavior normal.        Thought Content: Thought content normal.        Judgment: Judgment normal.          BP (!) 151/72   Pulse 83   Temp 98.1 F (36.7 C) (Temporal)   Ht  (1.575 m)   Wt 222 lb (100.7 kg)   SpO2 94%   BMI 40.60 kg/m   Assessment & Plan:   [name] comes in today with chief complaint of Hypothyroidism   Diagnosis and orders addressed:  1. Hypothyroidism, unspecified type - CMP14+EGFR - TSH  2. Essential hypertension - CMP14+EGFR - lisinopril (ZESTRIL) 20  MG tablet; Take 1 tablet (20 mg total) by mouth daily.  Dispense: 90 tablet; Refill: 3  3. Post-menopausal - CMP14+EGFR - DG [facility] DEXA   Labs pending PT will call and verify her medication list today Health Maintenance reviewed Diet and exercise encouraged  Follow up plan: 1 month to recheck HTN   [name], FNP

## 2022-12-17 NOTE — Patient Instructions (Signed)

## 2022-12-18 ENCOUNTER — Encounter: Payer: Self-pay | Admitting: Family Medicine

## 2022-12-18 LAB — CMP14+EGFR
ALT: 29 IU/L (ref 0–32)
AST: 22 IU/L (ref 0–40)
Albumin/Globulin Ratio: 1.8 (ref 1.2–2.2)
Albumin: 4.1 g/dL (ref 3.9–4.9)
Alkaline Phosphatase: 116 IU/L (ref 44–121)
BUN/Creatinine Ratio: 7 — ABNORMAL LOW (ref 12–28)
BUN: 5 mg/dL — ABNORMAL LOW (ref 8–27)
Bilirubin Total: 0.3 mg/dL (ref 0.0–1.2)
CO2: 26 mmol/L (ref 20–29)
Calcium: 9.2 mg/dL (ref 8.7–10.3)
Chloride: 98 mmol/L (ref 96–106)
Creatinine, Ser: 0.76 mg/dL (ref 0.57–1.00)
Globulin, Total: 2.3 g/dL (ref 1.5–4.5)
Glucose: 156 mg/dL — ABNORMAL HIGH (ref 70–99)
Potassium: 3.5 mmol/L (ref 3.5–5.2)
Sodium: 141 mmol/L (ref 134–144)
Total Protein: 6.4 g/dL (ref 6.0–8.5)
eGFR: 89 mL/min/{1.73_m2} (ref >59)

## 2022-12-18 LAB — TSH: TSH: 3.78 u[IU]/mL (ref 0.450–4.500)

## 2022-12-19 DIAGNOSIS — Z79899 Other long term (current) drug therapy: Secondary | ICD-10-CM | POA: Diagnosis not present

## 2022-12-19 DIAGNOSIS — M25512 Pain in left shoulder: Secondary | ICD-10-CM | POA: Diagnosis not present

## 2022-12-19 DIAGNOSIS — E538 Deficiency of other specified B group vitamins: Secondary | ICD-10-CM | POA: Diagnosis not present

## 2022-12-19 DIAGNOSIS — G43109 Migraine with aura, not intractable, without status migrainosus: Secondary | ICD-10-CM | POA: Diagnosis not present

## 2022-12-19 DIAGNOSIS — M542 Cervicalgia: Secondary | ICD-10-CM | POA: Diagnosis not present

## 2022-12-24 ENCOUNTER — Ambulatory Visit (INDEPENDENT_AMBULATORY_CARE_PROVIDER_SITE_OTHER): Payer: Medicare Other

## 2022-12-24 VITALS — Ht 62.0 in | Wt 222.0 lb

## 2022-12-24 DIAGNOSIS — Z0000 Encounter for general adult medical examination without abnormal findings: Secondary | ICD-10-CM
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
## 2022-12-24 NOTE — Patient Instructions (Signed)
Ms. Quraishi , Thank you for taking time to come for your Medicare Wellness Visit. I appreciate your ongoing commitment to your health goals. Please review the following plan we discussed and let me know if I can assist you in the future.   These are the goals we discussed:  Goals      Exercise 150 min/wk Moderate Activity     Have 3 meals a day     Goals Addressed             This Visit's Progress    Exercise 150 min/wk Moderate Activity   Not on track    Have 3 meals a day   No change              This is a list of the screening recommended for you and due dates:  Health Maintenance  Topic Date Due   COVID-19 Vaccine (3 - Pfizer risk series) 08/05/2020   Flu Shot  04/04/2023   Mammogram  05/22/2023   Screening for Lung Cancer  10/28/2023   Medicare Annual Wellness Visit  12/24/2023   DEXA scan (bone density measurement)  12/16/2024   DTaP/Tdap/Td vaccine (2 - Td or Tdap) 03/09/2025   Colon Cancer Screening  05/23/2032   Hepatitis C Screening: USPSTF Recommendation to screen - Ages 18-79 yo.  Completed   HIV Screening  Completed   Zoster (Shingles) Vaccine  Completed   HPV Vaccine  Aged Out    Advanced directives: Forms are available if you choose in the future to pursue completion.  This is recommended in order to make sure that your health wishes are honored in the event that you are unable to verbalize them to the provider.    Conditions/risks identified: Aim for 30 minutes of exercise or brisk walking, 6-8 glasses of water, and 5 servings of fruits and vegetables each day.   Next appointment: Follow up in one year for your annual wellness visit.   Preventive Care 40-64 Years, Female Preventive care refers to lifestyle choices and visits with your health care provider that can promote health and wellness. What does preventive care include? A yearly physical exam. This is also called an annual well check. Dental exams once or twice a year. Routine eye exams. Ask  your health care provider how often you should have your eyes checked. Personal lifestyle choices, including: Daily care of your teeth and gums. Regular physical activity. Eating a healthy diet. Avoiding tobacco and drug use. Limiting alcohol use. Practicing safe sex. Taking low-dose aspirin daily starting at age 55. Taking vitamin and mineral supplements as recommended by your health care provider. What happens during an annual well check? The services and screenings done by your health care provider during your annual well check will depend on your age, overall health, lifestyle risk factors, and family history of disease. Counseling  Your health care provider may ask you questions about your: Alcohol use. Tobacco use. Drug use. Emotional well-being. Home and relationship well-being. Sexual activity. Eating habits. Work and work Astronomer. Method of birth control. Menstrual cycle. Pregnancy history. Screening  You may have the following tests or measurements: Height, weight, and BMI. Blood pressure. Lipid and cholesterol levels. These may be checked every 5 years, or more frequently if you are over 51 years old. Skin check. Lung cancer screening. You may have this screening every year starting at age 58 if you have a 30-pack-year history of smoking and currently smoke or have quit within the past 15 years.  Fecal occult blood test (FOBT) of the stool. You may have this test every year starting at age 101. Flexible sigmoidoscopy or colonoscopy. You may have a sigmoidoscopy every 5 years or a colonoscopy every 10 years starting at age 27. Hepatitis C blood test. Hepatitis B blood test. Sexually transmitted disease (STD) testing. Diabetes screening. This is done by checking your blood sugar (glucose) after you have not eaten for a while (fasting). You may have this done every 1-3 years. Mammogram. This may be done every 1-2 years. Talk to your health care provider about when you  should start having regular mammograms. This may depend on whether you have a family history of breast cancer. BRCA-related cancer screening. This may be done if you have a family history of breast, ovarian, tubal, or peritoneal cancers. Pelvic exam and Pap test. This may be done every 3 years starting at age 101. Starting at age 58, this may be done every 5 years if you have a Pap test in combination with an HPV test. Bone density scan. This is done to screen for osteoporosis. You may have this scan if you are at high risk for osteoporosis. Discuss your test results, treatment options, and if necessary, the need for more tests with your health care provider. Vaccines  Your health care provider may recommend certain vaccines, such as: Influenza vaccine. This is recommended every year. Tetanus, diphtheria, and acellular pertussis (Tdap, Td) vaccine. You may need a Td booster every 10 years. Zoster vaccine. You may need this after age 44. Pneumococcal 13-valent conjugate (PCV13) vaccine. You may need this if you have certain conditions and were not previously vaccinated. Pneumococcal polysaccharide (PPSV23) vaccine. You may need one or two doses if you smoke cigarettes or if you have certain conditions. Talk to your health care provider about which screenings and vaccines you need and how often you need them. This information is not intended to replace advice given to you by your health care provider. Make sure you discuss any questions you have with your health care provider. Document Released: 09/16/2015 Document Revised: 05/09/2016 Document Reviewed: 06/21/2015 Elsevier Interactive Patient Education  2017 [employer].    Fall Prevention in the Home Falls can cause injuries. They can happen to people of all ages. There are many things you can do to make your home safe and to help prevent falls. What can I do on the outside of my home? Regularly fix the edges of walkways and driveways and fix  any cracks. Remove anything that might make you trip as you walk through a door, such as a raised step or threshold. Trim any bushes or trees on the path to your home. Use bright outdoor lighting. Clear any walking paths of anything that might make someone trip, such as rocks or tools. Regularly check to see if handrails are loose or broken. Make sure that both sides of any steps have handrails. Any raised decks and porches should have guardrails on the edges. Have any leaves, snow, or ice cleared regularly. Use sand or salt on walking paths during winter. Clean up any spills in your garage right away. This includes oil or grease spills. What can I do in the bathroom? Use night lights. Install grab bars by the toilet and in the tub and shower. Do not use towel bars as grab bars. Use non-skid mats or decals in the tub or shower. If you need to sit down in the shower, use a plastic, non-slip stool. Keep the floor  dry. Clean up any water that spills on the floor as soon as it happens. Remove soap buildup in the tub or shower regularly. Attach bath mats securely with double-sided non-slip rug tape. Do not have throw rugs and other things on the floor that can make you trip. What can I do in the bedroom? Use night lights. Make sure that you have a light by your bed that is easy to reach. Do not use any sheets or blankets that are too big for your bed. They should not hang down onto the floor. Have a firm chair that has side arms. You can use this for support while you get dressed. Do not have throw rugs and other things on the floor that can make you trip. What can I do in the kitchen? Clean up any spills right away. Avoid walking on wet floors. Keep items that you use a lot in easy-to-reach places. If you need to reach something above you, use a strong step stool that has a grab bar. Keep electrical cords out of the way. Do not use floor polish or wax that makes floors slippery. If you  must use wax, use non-skid floor wax. Do not have throw rugs and other things on the floor that can make you trip. What can I do with my stairs? Do not leave any items on the stairs. Make sure that there are handrails on both sides of the stairs and use them. Fix handrails that are broken or loose. Make sure that handrails are as long as the stairways. Check any carpeting to make sure that it is firmly attached to the stairs. Fix any carpet that is loose or worn. Avoid having throw rugs at the top or bottom of the stairs. If you do have throw rugs, attach them to the floor with carpet tape. Make sure that you have a light switch at the top of the stairs and the bottom of the stairs. If you do not have them, ask someone to add them for you. What else can I do to help prevent falls? Wear shoes that: Do not have high heels. Have rubber bottoms. Are comfortable and fit you well. Are closed at the toe. Do not wear sandals. If you use a stepladder: Make sure that it is fully opened. Do not climb a closed stepladder. Make sure that both sides of the stepladder are locked into place. Ask someone to hold it for you, if possible. Clearly mark and make sure that you can see: Any grab bars or handrails. First and last steps. Where the edge of each step is. Use tools that help you move around (mobility aids) if they are needed. These include: Canes. Walkers. Scooters. Crutches. Turn on the lights when you go into a dark area. Replace any light bulbs as soon as they burn out. Set up your furniture so you have a clear path. Avoid moving your furniture around. If any of your floors are uneven, fix them. If there are any pets around you, be aware of where they are. Review your medicines with your doctor. Some medicines can make you feel dizzy. This can increase your chance of falling. Ask your doctor what other things that you can do to help prevent falls. This information is not intended to replace  advice given to you by your health care provider. Make sure you discuss any questions you have with your health care provider. Document Released: 06/16/2009 Document Revised: 01/26/2016 Document Reviewed: 09/24/2014 Elsevier Interactive Patient  Education  2017 [employer].

## 2022-12-27 ENCOUNTER — Other Ambulatory Visit: Payer: Self-pay | Admitting: Family

## 2022-12-27 DIAGNOSIS — I10 Essential (primary) hypertension: Secondary | ICD-10-CM

## 2023-01-04 ENCOUNTER — Encounter: Payer: Self-pay | Admitting: Family Medicine

## 2023-01-07 ENCOUNTER — Telehealth: Payer: Self-pay | Admitting: *Deleted

## 2023-01-07 NOTE — Telephone Encounter (Signed)
Spoke with pt. She is aware Dr. Jena Gauss will not be available 5/23. Rescheduled procedure for 6/26.a ware will send new instructions/pre-op appt. She voiced understanding. Message sent to endo making aware.

## 2023-01-15 ENCOUNTER — Encounter (HOSPITAL_COMMUNITY): Payer: Medicare Other

## 2023-01-28 ENCOUNTER — Other Ambulatory Visit: Payer: Self-pay | Admitting: Family

## 2023-01-31 ENCOUNTER — Other Ambulatory Visit: Payer: Self-pay | Admitting: Internal Medicine

## 2023-02-18 ENCOUNTER — Encounter: Payer: Self-pay | Admitting: Family

## 2023-02-18 ENCOUNTER — Ambulatory Visit (INDEPENDENT_AMBULATORY_CARE_PROVIDER_SITE_OTHER): Payer: Medicare Other | Admitting: Family

## 2023-02-18 VITALS — BP 164/84 | HR 74 | Temp 97.9°F | Ht 62.0 in | Wt 228.8 lb

## 2023-02-18 DIAGNOSIS — H5203 Hypermetropia, bilateral: Secondary | ICD-10-CM | POA: Diagnosis not present

## 2023-02-18 DIAGNOSIS — R490 Dysphonia: Secondary | ICD-10-CM

## 2023-02-18 DIAGNOSIS — H2513 Age-related nuclear cataract, bilateral: Secondary | ICD-10-CM | POA: Diagnosis not present

## 2023-02-18 DIAGNOSIS — F419 Anxiety disorder, unspecified: Secondary | ICD-10-CM | POA: Diagnosis not present

## 2023-02-18 DIAGNOSIS — I10 Essential (primary) hypertension: Secondary | ICD-10-CM | POA: Diagnosis not present

## 2023-02-18 DIAGNOSIS — F17210 Nicotine dependence, cigarettes, uncomplicated: Secondary | ICD-10-CM

## 2023-02-18 DIAGNOSIS — H43392 Other vitreous opacities, left eye: Secondary | ICD-10-CM | POA: Diagnosis not present

## 2023-02-18 DIAGNOSIS — H524 Presbyopia: Secondary | ICD-10-CM | POA: Diagnosis not present

## 2023-02-18 DIAGNOSIS — F4321 Adjustment disorder with depressed mood: Secondary | ICD-10-CM

## 2023-02-18 DIAGNOSIS — H52223 Regular astigmatism, bilateral: Secondary | ICD-10-CM | POA: Diagnosis not present

## 2023-02-18 DIAGNOSIS — D23111 Other benign neoplasm of skin of right upper eyelid, including canthus: Secondary | ICD-10-CM | POA: Diagnosis not present

## 2023-02-18 DIAGNOSIS — F17200 Nicotine dependence, unspecified, uncomplicated: Secondary | ICD-10-CM

## 2023-02-18 MED ORDER — BUSPIRONE HCL 7.5 MG PO TABS
7.5000 mg | ORAL_TABLET | Freq: Three times a day (TID) | ORAL | 0 refills | Status: DC | PRN
Start: 2023-02-18 — End: 2023-03-14

## 2023-02-18 MED ORDER — FLUTICASONE PROPIONATE 50 MCG/ACT NA SUSP: 2.0000 | Freq: Every day | NASAL | 6 refills | Status: DC

## 2023-02-18 MED ORDER — CETIRIZINE HCL 10 MG PO TABS
10.0000 mg | ORAL_TABLET | Freq: Every day | ORAL | 1 refills | Status: DC
Start: 2023-02-18 — End: 2023-05-23

## 2023-02-18 MED ORDER — LISINOPRIL 40 MG PO TABS
40.0000 mg | ORAL_TABLET | Freq: Every day | ORAL | 3 refills | Status: DC
Start: 2023-02-18 — End: 2023-10-17

## 2023-02-18 NOTE — Progress Notes (Signed)
Subjective:    Patient ID: Elizabeth Brewer, female    DOB: 1961/06/09, 62 y.o.   MRN: [id number]  Chief Complaint  Patient presents with   Hypertension   Hoarse    BEEN GOING ON FOR A COUPLE ON MTHS    PT presents to the office today to recheck HTN. She reports she has been out of her lisinopril for a few days. Her BP is very elevated today.   Complaining of hoarse voice for several months. Has taken medication for thrush without relief. She is a smoker and trying to decrease, reports she is smoking every 90 mins compared to a pack a day. Reports mild GERD at times. Does take PPI 40 mg.  Hypertension This is a chronic problem. The current episode started more than 1 year ago. The problem is unchanged. The problem is uncontrolled. Associated symptoms include anxiety and malaise/fatigue. Pertinent negatives include no peripheral edema or shortness of breath. Risk factors for coronary artery disease include obesity, dyslipidemia and sedentary lifestyle.  Nicotine Dependence Presents for follow-up visit. Symptoms include irritability. Her urge triggers include company of smokers. The symptoms have been stable. She smokes < 1/2 a pack of cigarettes per day.  Anxiety Presents for follow-up visit. Symptoms include excessive worry, irritability, nervous/anxious behavior and restlessness. Patient reports no shortness of breath.        Review of Systems  Constitutional:  Positive for irritability and malaise/fatigue.  Respiratory:  Negative for shortness of breath.   Psychiatric/Behavioral:  The patient is nervous/anxious.   All other systems reviewed and are negative.      Objective:   Physical Exam Vitals reviewed.  Constitutional:      General: She is not in acute distress.    Appearance: She is well-developed. She is obese.  HENT:     Head: Normocephalic and atraumatic.     Right Ear: Tympanic membrane normal.     Left Ear: Tympanic membrane normal.     Ears:     Comments:  Hoarse voice Eyes:     Pupils: Pupils are equal, round, and reactive to light.  Neck:     Thyroid: No thyromegaly.  Cardiovascular:     Rate and Rhythm: Normal rate and regular rhythm.     Heart sounds: Normal heart sounds. No murmur heard. Pulmonary:     Effort: Pulmonary effort is normal. No respiratory distress.     Breath sounds: Normal breath sounds. No wheezing.  Abdominal:     General: Bowel sounds are normal. There is no distension.     Palpations: Abdomen is soft.     Tenderness: There is no abdominal tenderness.  Musculoskeletal:        General: No tenderness. Normal range of motion.     Cervical back: Normal range of motion and neck supple.  Skin:    General: Skin is warm and dry.  Neurological:     Mental Status: She is alert and oriented to person, place, and time.     Cranial Nerves: No cranial nerve deficit.     Deep Tendon Reflexes: Reflexes are normal and symmetric.  Psychiatric:        Behavior: Behavior normal.        Thought Content: Thought content normal.        Judgment: Judgment normal.       BP (!) 185/94   Pulse 74   Temp 97.9 F (36.6 C) (Temporal)   Ht 5\' 2"  (1.575 m)   Wt  228 lb 12.8 oz (103.8 kg)   SpO2 94%   BMI 41.85 kg/m      Assessment & Plan:   Elizabeth Brewer comes in today with chief complaint of Hypertension and Hoarse (BEEN GOING ON FOR A COUPLE ON MTHS )   Diagnosis and orders addressed:  1. Anxiety - busPIRone (BUSPAR) 7.5 MG tablet; Take 1 tablet (7.5 mg total) by mouth 3 (three) times daily as needed.  Dispense: 90 tablet; Refill: 0  2. Grief - busPIRone (BUSPAR) 7.5 MG tablet; Take 1 tablet (7.5 mg total) by mouth 3 (three) times daily as needed.  Dispense: 90 tablet; Refill: 0  3. Essential hypertension Will increase lisinopril to 40 mg from 20 mg -Daily blood pressure log given with instructions on how to fill out and told to bring to next visit -Dash diet information given -Exercise encouraged - Stress  Management  -Continue current meds -RTO in 2 weeks  - lisinopril (ZESTRIL) 40 MG tablet; Take 1 tablet (40 mg total) by mouth daily.  Dispense: 90 tablet; Refill: 3  4. Hoarseness of voice Start zyrtec and flonase If not improvement given smoker will do referral to ENT - cetirizine (ZYRTEC ALLERGY) 10 MG tablet; Take 1 tablet (10 mg total) by mouth daily.  Dispense: 90 tablet; Refill: 1 - lisinopril (ZESTRIL) 40 MG tablet; Take 1 tablet (40 mg total) by mouth daily.  Dispense: 90 tablet; Refill: 3   Labs pending Health Maintenance reviewed Diet and exercise encouraged  Follow up plan: 2 weeks    [name], FNP

## 2023-02-18 NOTE — Patient Instructions (Signed)
Hoarseness  Hoarseness, also called dysphonia, is any abnormal change in your voice that can make it difficult to speak. Your voice may sound raspy, breathy, or strained. Hoarseness is caused by a problem with your vocal cords (vocal folds). These are two bands of tissue inside your voice box (larynx). When you speak, your vocal cords move back and forth to create sound. The surfaces of your vocal cords need to be smooth for your voice to sound clear. Swelling or lumps on your vocal cords can cause hoarseness. Vocal cord problems may be the result of injuries or abnormal growths, certain diseases, upper respiratory infection, or allergies. Other causes may include medicine side effects and exposure to irritants. Follow these instructions at home:  Pay attention to any changes in your symptoms. Take these actions to stay safe and to help relieve your symptoms: Lifestyle Do not eat foods that give you heartburn, such as spicy or acidic foods like hot peppers and orange juice. These foods can cause a gastroesophageal reflux that may worsen your vocal cord problems. Limit how much alcohol and caffeine you drink as told by your health care provider. Drink enough fluid to keep your urine pale yellow. Do not use any products that contain nicotine or tobacco. These products include cigarettes, chewing tobacco, and vaping devices, such as e-cigarettes. If you need help quitting, ask your health care provider. Avoid secondhand smoke. General instructions Use a humidifier if the air in your home is dry. Avoid coughing or clearing your throat. Do not whisper. Whispering can cause muscle strain. Do not speak in a loud or harsh voice. Rest your voice. If recommended by your health care provider, schedule an appointment with a speech-language specialist. This specialist may give you methods to try that can help you avoid misusing your voice. Contact a health care provider if: Your voice is hoarse longer than  2 weeks. You almost lose or completely lose your voice for more than 3 days. You have pain when you swallow or try to talk. You feel a lump in your neck. Get help right away if: You have trouble swallowing. You feel like you are choking when you swallow. You cough up blood or vomit blood. You have trouble breathing. You choke, cannot swallow, or cannot breathe if you lie flat. You notice swelling or a rash on your body, face, or tongue. These symptoms may represent a serious problem that is an emergency. Do not wait to see if the symptoms will go away. Get medical help right away. Call your local emergency services (911 in the [place] Do not drive yourself to the hospital. Summary Hoarseness, also called dysphonia, is any abnormal change in your voice that can make it difficult to speak. Your voice may sound raspy, breathy, or strained. Hoarseness is caused by a problem with your vocal cords (vocal folds). Do not speak in a loud or harsh voice, whisper, use nicotine or tobacco products, or eat foods that give you heartburn. See your health care provider if your hoarseness does not improve after 2 weeks. This information is not intended to replace advice given to you by your health care provider. Make sure you discuss any questions you have with your health care provider. Document Revised: 02/07/2021 Document Reviewed: 02/07/2021 Elsevier Patient Education  2024 [employer].

## 2023-02-19 ENCOUNTER — Other Ambulatory Visit: Payer: Self-pay | Admitting: Family

## 2023-02-19 DIAGNOSIS — J449 Chronic obstructive pulmonary disease, unspecified: Secondary | ICD-10-CM

## 2023-02-19 MED ORDER — FLUTICASONE FUROATE-VILANTEROL 100-25 MCG/ACT IN AEPB
1.0000 | INHALATION_SPRAY | Freq: Every day | RESPIRATORY_TRACT | 11 refills | Status: DC
Start: 2023-02-19 — End: 2023-03-11

## 2023-02-19 NOTE — Telephone Encounter (Signed)
  Prescription Request  02/19/2023  Is this a "Controlled Substance" medicine? no  Have you seen your PCP in the last 2 weeks? Yes yesterday  If YES, route message to pool  -  If NO, patient needs to be scheduled for appointment.  What is the name of the medication or equipment? fluticasone furoate-vilanterol (BREO ELLIPTA) 100-25 MCG/ACT AEPB   Have you contacted your pharmacy to request a refill? yes   Which pharmacy would you like this sent to? [facility]    Patient notified that their request is being sent to the clinical staff for review and that they should receive a response within 2 business days.

## 2023-02-22 NOTE — Patient Instructions (Signed)
Elizabeth Brewer  02/22/2023     @PREFPERIOPPHARMACY @   Your procedure is scheduled on  02/27/2023.   Report to [facility] at  [phone]  A.M.   Call this number if you have problems the morning of surgery:  [phone]  If you experience any cold or flu symptoms such as cough, fever, chills, shortness of breath, etc. between now and your scheduled surgery, please notify us at the above number.   Remember:  Follow the diet and prep instructions given to you by the office.     Take these medicines the morning of surgery with A SIP OF WATER          buspirone,zyrtec, citalopram, clonazepam, gabapentin, levothyroxine, omeprazole, oxycodone(if needed), imitrex(if needed), topamax.     Do not wear jewelry, make-up or nail polish, including gel polish,  artificial nails, or any other type of covering on natural nails (fingers and  toes).  Do not wear lotions, powders, or perfumes, or deodorant.  Do not shave 48 hours prior to surgery.  Men may shave face and neck.  Do not bring valuables to the hospital.  [facility] is not responsible for any belongings or valuables.  Contacts, dentures or bridgework may not be worn into surgery.  Leave your suitcase in the car.  After surgery it may be brought to your room.  For patients admitted to the hospital, discharge time will be determined by your treatment team.  Patients discharged the day of surgery will not be allowed to drive home and must have someone with them for 24 hours.    Special instructions:   DO NOT smoke tobacco or vape for 24 hours before your procedure.  Please read over the following fact sheets that you were given. Anesthesia Post-op Instructions and Care and Recovery After Surgery      Colonoscopy, Adult, Care After The following information offers guidance on how to care for yourself after your procedure. Your health care provider may also give you more specific instructions. If you have problems or  questions, contact your health care provider. What can I expect after the procedure? After the procedure, it is common to have: A small amount of blood in your stool for 24 hours after the procedure. Some gas. Mild cramping or bloating of your abdomen. Follow these instructions at home: Eating and drinking  Drink enough fluid to keep your urine pale yellow. Follow instructions from your health care provider about eating or drinking restrictions. Resume your normal diet as told by your health care provider. Avoid heavy or fried foods that are hard to digest. Activity Rest as told by your health care provider. Avoid sitting for a long time without moving. Get up to take short walks every 1-2 hours. This is important to improve blood flow and breathing. Ask for help if you feel weak or unsteady. Return to your normal activities as told by your health care provider. Ask your health care provider what activities are safe for you. Managing cramping and bloating  Try walking around when you have cramps or feel bloated. If directed, apply heat to your abdomen as told by your health care provider. Use the heat source that your health care provider recommends, such as a moist heat pack or a heating pad. Place a towel between your skin and the heat source. Leave the heat on for 20-30 minutes. Remove the heat if your skin turns bright red. This is especially important if you  are unable to feel pain, heat, or cold. You have a greater risk of getting burned. General instructions If you were given a sedative during the procedure, it can affect you for several hours. Do not drive or operate machinery until your health care provider says that it is safe. For the first 24 hours after the procedure: Do not sign important documents. Do not drink alcohol. Do your regular daily activities at a slower pace than normal. Eat soft foods that are easy to digest. Take over-the-counter and prescription medicines  only as told by your health care provider. Keep all follow-up visits. This is important. Contact a health care provider if: You have blood in your stool 2-3 days after the procedure. Get help right away if: You have more than a small spotting of blood in your stool. You have large blood clots in your stool. You have swelling of your abdomen. You have nausea or vomiting. You have a fever. You have increasing pain in your abdomen that is not relieved with medicine. These symptoms may be an emergency. Get help right away. Call 911. Do not wait to see if the symptoms will go away. Do not drive yourself to the hospital. Summary After the procedure, it is common to have a small amount of blood in your stool. You may also have mild cramping and bloating of your abdomen. If you were given a sedative during the procedure, it can affect you for several hours. Do not drive or operate machinery until your health care provider says that it is safe. Get help right away if you have a lot of blood in your stool, nausea or vomiting, a fever, or increased pain in your abdomen. This information is not intended to replace advice given to you by your health care provider. Make sure you discuss any questions you have with your health care provider. Document Revised: 10/02/2022 Document Reviewed: 04/12/2021 Elsevier Patient Education  2024 [employer] Monitored Anesthesia Care, Care After The following information offers guidance on how to care for yourself after your procedure. Your health care provider may also give you more specific instructions. If you have problems or questions, contact your health care provider. What can I expect after the procedure? After the procedure, it is common to have: Tiredness. Little or no memory about what happened during or after the procedure. Impaired judgment when it comes to making decisions. Nausea or vomiting. Some trouble with balance. Follow these instructions at  home: For the time period you were told by your health care provider:  Rest. Do not participate in activities where you could fall or become injured. Do not drive or use machinery. Do not drink alcohol. Do not take sleeping pills or medicines that cause drowsiness. Do not make important decisions or sign legal documents. Do not take care of children on your own. Medicines Take over-the-counter and prescription medicines only as told by your health care provider. If you were prescribed antibiotics, take them as told by your health care provider. Do not stop using the antibiotic even if you start to feel better. Eating and drinking Follow instructions from your health care provider about what you may eat and drink. Drink enough fluid to keep your urine pale yellow. If you vomit: Drink clear fluids slowly and in small amounts as you are able. Clear fluids include water, ice chips, low-calorie sports drinks, and fruit juice that has water added to it (diluted fruit juice). Eat light and bland foods in small amounts as  you are able. These foods include bananas, applesauce, rice, lean meats, toast, and crackers. General instructions  Have a responsible adult stay with you for the time you are told. It is important to have someone help care for you until you are awake and alert. If you have sleep apnea, surgery and some medicines can increase your risk for breathing problems. Follow instructions from your health care provider about wearing your sleep device: When you are sleeping. This includes during daytime naps. While taking prescription pain medicines, sleeping medicines, or medicines that make you drowsy. Do not use any products that contain nicotine or tobacco. These products include cigarettes, chewing tobacco, and vaping devices, such as e-cigarettes. If you need help quitting, ask your health care provider. Contact a health care provider if: You feel nauseous or vomit every time you eat  or drink. You feel light-headed. You are still sleepy or having trouble with balance after 24 hours. You get a rash. You have a fever. You have redness or swelling around the IV site. Get help right away if: You have trouble breathing. You have new confusion after you get home. These symptoms may be an emergency. Get help right away. Call 911. Do not wait to see if the symptoms will go away. Do not drive yourself to the hospital. This information is not intended to replace advice given to you by your health care provider. Make sure you discuss any questions you have with your health care provider. Document Revised: 01/15/2022 Document Reviewed: 01/15/2022 Elsevier Patient Education  2024 [employer].

## 2023-02-25 ENCOUNTER — Encounter (HOSPITAL_COMMUNITY)
Admission: RE | Admit: 2023-02-25 | Discharge: 2023-02-25 | Disposition: A | Discharge status: Home / Self Care | Payer: Medicare Other | Source: Ambulatory Visit | Attending: Internal Medicine | Admitting: Internal Medicine

## 2023-02-25 VITALS — BP 164/84 | HR 74 | Temp 97.9°F | Resp 18 | Ht 62.0 in | Wt 228.7 lb

## 2023-02-25 DIAGNOSIS — Z01812 Encounter for preprocedural laboratory examination: Secondary | ICD-10-CM | POA: Diagnosis not present

## 2023-02-25 DIAGNOSIS — E876 Hypokalemia: Secondary | ICD-10-CM | POA: Insufficient documentation

## 2023-02-25 LAB — BASIC METABOLIC PANEL
Anion gap: 9 (ref 5–15)
BUN: 7 mg/dL — ABNORMAL LOW (ref 8–23)
CO2: 26 mmol/L (ref 22–32)
Calcium: 8.7 mg/dL — ABNORMAL LOW (ref 8.9–10.3)
Chloride: 98 mmol/L (ref 98–111)
Creatinine, Ser: 0.83 mg/dL (ref 0.44–1.00)
GFR, Estimated: >60 mL/min (ref >60)
Glucose, Bld: 266 mg/dL — ABNORMAL HIGH (ref 70–99)
Potassium: 3.4 mmol/L — ABNORMAL LOW (ref 3.5–5.1)
Sodium: 133 mmol/L — ABNORMAL LOW (ref 135–145)

## 2023-02-27 ENCOUNTER — Encounter (HOSPITAL_COMMUNITY)
Admission: RE | Disposition: A | Discharge status: Home / Self Care | Payer: Self-pay | Source: Home / Self Care | Attending: Internal Medicine

## 2023-02-27 ENCOUNTER — Ambulatory Visit (HOSPITAL_COMMUNITY)
Admission: RE | Admit: 2023-02-27 | Discharge: 2023-02-27 | Disposition: A | Discharge status: Home / Self Care | Payer: Medicare Other | Attending: Internal Medicine | Admitting: Internal Medicine

## 2023-02-27 ENCOUNTER — Ambulatory Visit (HOSPITAL_COMMUNITY): Payer: Medicare Other | Admitting: Certified Registered Nurse Anesthetist

## 2023-02-27 ENCOUNTER — Ambulatory Visit (HOSPITAL_BASED_OUTPATIENT_CLINIC_OR_DEPARTMENT_OTHER): Payer: Medicare Other | Admitting: Certified Registered Nurse Anesthetist

## 2023-02-27 DIAGNOSIS — Z86010 Personal history of colonic polyps: Secondary | ICD-10-CM | POA: Diagnosis not present

## 2023-02-27 DIAGNOSIS — F17210 Nicotine dependence, cigarettes, uncomplicated: Secondary | ICD-10-CM | POA: Insufficient documentation

## 2023-02-27 DIAGNOSIS — K635 Polyp of colon: Secondary | ICD-10-CM | POA: Diagnosis not present

## 2023-02-27 DIAGNOSIS — I10 Essential (primary) hypertension: Secondary | ICD-10-CM

## 2023-02-27 DIAGNOSIS — D124 Benign neoplasm of descending colon: Secondary | ICD-10-CM | POA: Diagnosis not present

## 2023-02-27 DIAGNOSIS — J449 Chronic obstructive pulmonary disease, unspecified: Secondary | ICD-10-CM | POA: Diagnosis not present

## 2023-02-27 DIAGNOSIS — K219 Gastro-esophageal reflux disease without esophagitis: Secondary | ICD-10-CM | POA: Insufficient documentation

## 2023-02-27 DIAGNOSIS — K5790 Diverticulosis of intestine, part unspecified, without perforation or abscess without bleeding: Secondary | ICD-10-CM | POA: Diagnosis not present

## 2023-02-27 DIAGNOSIS — F17200 Nicotine dependence, unspecified, uncomplicated: Secondary | ICD-10-CM | POA: Diagnosis not present

## 2023-02-27 DIAGNOSIS — Z1211 Encounter for screening for malignant neoplasm of colon: Secondary | ICD-10-CM | POA: Diagnosis not present

## 2023-02-27 DIAGNOSIS — Z87891 Personal history of nicotine dependence: Secondary | ICD-10-CM | POA: Diagnosis not present

## 2023-02-27 DIAGNOSIS — E039 Hypothyroidism, unspecified: Secondary | ICD-10-CM | POA: Insufficient documentation

## 2023-02-27 DIAGNOSIS — F419 Anxiety disorder, unspecified: Secondary | ICD-10-CM | POA: Diagnosis not present

## 2023-02-27 DIAGNOSIS — F32A Depression, unspecified: Secondary | ICD-10-CM | POA: Insufficient documentation

## 2023-02-27 DIAGNOSIS — Z09 Encounter for follow-up examination after completed treatment for conditions other than malignant neoplasm: Secondary | ICD-10-CM | POA: Diagnosis not present

## 2023-02-27 DIAGNOSIS — K5730 Diverticulosis of large intestine without perforation or abscess without bleeding: Secondary | ICD-10-CM

## 2023-02-27 DIAGNOSIS — G40909 Epilepsy, unspecified, not intractable, without status epilepticus: Secondary | ICD-10-CM | POA: Diagnosis not present

## 2023-02-27 DIAGNOSIS — D175 Benign lipomatous neoplasm of intra-abdominal organs: Secondary | ICD-10-CM | POA: Diagnosis not present

## 2023-02-27 HISTORY — PX: POLYPECTOMY: SHX5525

## 2023-02-27 HISTORY — PX: COLONOSCOPY WITH PROPOFOL: SHX5780

## 2023-02-27 LAB — GLUCOSE, CAPILLARY: Glucose-Capillary: 125 mg/dL — ABNORMAL HIGH (ref 70–99)

## 2023-02-27 SURGERY — COLONOSCOPY WITH PROPOFOL
Anesthesia: General

## 2023-02-27 MED ORDER — PROPOFOL 10 MG/ML IV BOLUS
INTRAVENOUS | Status: DC | PRN
  Administered 2023-02-27: 70 mg via INTRAVENOUS

## 2023-02-27 MED ORDER — LACTATED RINGERS IV SOLN: INTRAVENOUS | Status: DC | PRN

## 2023-02-27 MED ORDER — LIDOCAINE HCL (CARDIAC) PF 100 MG/5ML IV SOSY
PREFILLED_SYRINGE | INTRAVENOUS | Status: DC | PRN
  Administered 2023-02-27: 60 mg via INTRATRACHEAL

## 2023-02-27 MED ORDER — PROPOFOL 1000 MG/100ML IV EMUL
INTRAVENOUS | Status: AC
  Filled 2023-02-27: qty 200

## 2023-02-27 MED ORDER — PROPOFOL 500 MG/50ML IV EMUL
INTRAVENOUS | Status: AC
  Filled 2023-02-27: qty 100

## 2023-02-27 MED ORDER — LIDOCAINE HCL (PF) 2 % IJ SOLN
INTRAMUSCULAR | Status: AC
  Filled 2023-02-27: qty 30

## 2023-02-27 MED ORDER — LACTATED RINGERS IV SOLN: INTRAVENOUS | Status: DC

## 2023-02-27 MED ORDER — PROPOFOL 500 MG/50ML IV EMUL
INTRAVENOUS | Status: DC | PRN
  Administered 2023-02-27: 150 ug/kg/min via INTRAVENOUS

## 2023-02-27 NOTE — Discharge Instructions (Signed)
  Colonoscopy Discharge Instructions  Read the instructions outlined below and refer to this sheet in the next few weeks. These discharge instructions provide you with general information on caring for yourself after you leave the hospital. Your doctor may also give you specific instructions. While your treatment has been planned according to the most current medical practices available, unavoidable complications occasionally occur. If you have any problems or questions after discharge, call Dr. Jena Gauss at [phone]. ACTIVITY You may resume your regular activity, but move at a slower pace for the next 24 hours.  Take frequent rest periods for the next 24 hours.  Walking will help get rid of the air and reduce the bloated feeling in your belly (abdomen).  No driving for 24 hours (because of the medicine (anesthesia) used during the test).   Do not sign any important legal documents or operate any machinery for 24 hours (because of the anesthesia used during the test).  NUTRITION Drink plenty of fluids.  You may resume your normal diet as instructed by your doctor.  Begin with a light meal and progress to your normal diet. Heavy or fried foods are harder to digest and may make you feel sick to your stomach (nauseated).  Avoid alcoholic beverages for 24 hours or as instructed.  MEDICATIONS You may resume your normal medications unless your doctor tells you otherwise.  WHAT YOU CAN EXPECT TODAY Some feelings of bloating in the abdomen.  Passage of more gas than usual.  Spotting of blood in your stool or on the toilet paper.  IF YOU HAD POLYPS REMOVED DURING THE COLONOSCOPY: No aspirin products for 7 days or as instructed.  No alcohol for 7 days or as instructed.  Eat a soft diet for the next 24 hours.  FINDING OUT THE RESULTS OF YOUR TEST Not all test results are available during your visit. If your test results are not back during the visit, make an appointment with your caregiver to find out the  results. Do not assume everything is normal if you have not heard from your caregiver or the medical facility. It is important for you to follow up on all of your test results.  SEEK IMMEDIATE MEDICAL ATTENTION IF: You have more than a spotting of blood in your stool.  Your belly is swollen (abdominal distention).  You are nauseated or vomiting.  You have a temperature over 101.  You have abdominal pain or discomfort that is severe or gets worse throughout the day.    You had 1 small polyp remaining in your colon today which was removed.  Colon polyp and diverticulosis information provided  Further recommendations to follow pending review of pathology report  At patient request, I called Nena Alexander at [phone] -reviewed findings and recommendations

## 2023-02-27 NOTE — Anesthesia Preprocedure Evaluation (Signed)
Anesthesia Evaluation  Patient identified by MRN, date of birth, ID band Patient awake    Reviewed: Allergy & Precautions, H&P , NPO status , Patient's Chart, lab work & pertinent test results, reviewed documented beta blocker date and time   Airway Mallampati: II  TM Distance: >3 FB Neck ROM: full    Dental no notable dental hx.    Pulmonary neg pulmonary ROS, COPD, Current Smoker   Pulmonary exam normal breath sounds clear to auscultation       Cardiovascular Exercise Tolerance: Good hypertension, negative cardio ROS  Rhythm:regular Rate:Normal     Neuro/Psych  Headaches, Seizures -,  PSYCHIATRIC DISORDERS Anxiety Depression     Neuromuscular disease negative neurological ROS  negative psych ROS   GI/Hepatic negative GI ROS, Neg liver ROS,GERD  ,,  Endo/Other  negative endocrine ROSHypothyroidism    Renal/GU negative Renal ROS  negative genitourinary   Musculoskeletal   Abdominal   Peds  Hematology negative hematology ROS (+)   Anesthesia Other Findings   Reproductive/Obstetrics negative OB ROS                             Anesthesia Physical Anesthesia Plan  ASA: 3  Anesthesia Plan: General   Post-op Pain Management:    Induction:   PONV Risk Score and Plan: Propofol infusion  Airway Management Planned:   Additional Equipment:   Intra-op Plan:   Post-operative Plan:   Informed Consent: I have reviewed the patients History and Physical, chart, labs and discussed the procedure including the risks, benefits and alternatives for the proposed anesthesia with the patient or authorized representative who has indicated his/her understanding and acceptance.     Dental Advisory Given  Plan Discussed with: CRNA  Anesthesia Plan Comments:        Anesthesia Quick Evaluation

## 2023-02-27 NOTE — Op Note (Signed)
[facility] Patient Name: Elizabeth Brewer Procedure Date: 02/27/2023 8:21 AM MRN: [id number] Date of Birth: Jan 12, 1961 Attending MD: Gennette Pac , MD, [id number] CSN: [id number] Age: 62 Admit Type: Outpatient Procedure:                Colonoscopy Indications:              High risk colon cancer surveillance: Personal                            history of colonic polyps Providers:                Gennette Pac, MD, Nena Polio, RN, Dyann Ruddle Referring MD:              Medicines:                Propofol per Anesthesia Complications:            No immediate complications. Estimated Blood Loss:     Estimated blood loss was minimal. Procedure:                Pre-Anesthesia Assessment:                           - Prior to the procedure, a History and Physical                            was performed, and patient medications and                            allergies were reviewed. The patient's tolerance of                            previous anesthesia was also reviewed. The risks                            and benefits of the procedure and the sedation                            options and risks were discussed with the patient.                            All questions were answered, and informed consent                            was obtained. Prior Anticoagulants: The patient has                            taken no anticoagulant or antiplatelet agents. ASA                            Grade Assessment: III - A patient with severe  systemic disease. After reviewing the risks and                            benefits, the patient was deemed in satisfactory                            condition to undergo the procedure.                           After obtaining informed consent, the colonoscope                            was passed under direct vision. Throughout the                            procedure, the patient's  blood pressure, pulse, and                            oxygen saturations were monitored continuously. The                            [id number]) scope was introduced through the                            anus and advanced to the the cecum, identified by                            appendiceal orifice and ileocecal valve. The                            colonoscopy was performed without difficulty. The                            patient tolerated the procedure well. The quality                            of the bowel preparation was adequate. The                            ileocecal valve, appendiceal orifice, and rectum                            were photographed. The entire colon was well                            visualized. Scope In: 8:40:31 AM Scope Out: 9:04:10 AM Scope Withdrawal Time: 0 hours 8 minutes 45 seconds  Total Procedure Duration: 0 hours 23 minutes 39 seconds  Findings:      The perianal and digital rectal examinations were normal. Elongated and       redundant colon. External abdominal pressure and changing of the       patient's position required to reach the cecum.      Scattered small-mouthed diverticula were found in the sigmoid colon and       descending colon.  1.5 cm yellowish submucosal nodule ascending segment.       Positive pillow sign.      A 6 mm polyp was found in the mid descending colon. The polyp was       sessile. The polyp was removed with a cold snare. Resection and       retrieval were complete. Estimated blood loss was minimal. Site of prior       piecemeal polypectomy ileocecal valve was closely inspected. No residual       polyp tissue noted. Rectal mucosa well-seen. Rectal vault too small to       retroflex. Seen well on?"face. Appeared normal. Impression:               - Diverticulosis in the sigmoid colon and in the                            descending colon. Redundant and elongated colon as                            described  above. No evidence of residual polyp at                            the ileocecal valve.                           - One 6 mm polyp in the mid descending colon,                            removed with a cold snare. Resected and retrieved.                            Ascending colon lipoma. Moderate Sedation:      Moderate (conscious) sedation was personally administered by an       anesthesia professional. The following parameters were monitored: oxygen       saturation, heart rate, blood pressure, respiratory rate, EKG, adequacy       of pulmonary ventilation, and response to care. Recommendation:           - Patient has a contact number available for                            emergencies. The signs and symptoms of potential                            delayed complications were discussed with the                            patient. Return to normal activities tomorrow.                            Written discharge instructions were provided to the                            patient.                           -  Advance diet as tolerated.                           - Continue present medications.                           - Repeat colonoscopy date to be determined after                            pending pathology results are reviewed for                            surveillance.                           - Return to GI office (date not yet determined). Procedure Code(s):        --- Professional ---                           [phone], Colonoscopy, flexible; with removal of                            tumor(s), polyp(s), or other lesion(s) by snare                            technique Diagnosis Code(s):        --- Professional ---                           Z86.010, Personal history of colonic polyps                           D12.4, Benign neoplasm of descending colon                           K57.30, Diverticulosis of large intestine without                            perforation or abscess  without bleeding CPT copyright 2022 American Medical Association. All rights reserved. The codes documented in this report are preliminary and upon coder review may  be revised to meet current compliance requirements. Gerrit Friends. Erbie Arment, MD Gennette Pac, MD 02/27/2023 9:14:13 AM This report has been signed electronically. Number of Addenda: 0
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
## 2023-02-27 NOTE — Transfer of Care (Signed)
Immediate Anesthesia Transfer of Care Note  Patient: Akosua H Onofrio  Procedure(s) Performed: COLONOSCOPY WITH PROPOFOL POLYPECTOMY  Patient Location: Endoscopy Unit  Anesthesia Type:General  Level of Consciousness: awake, alert , and oriented  Airway & Oxygen Therapy: Patient Spontanous Breathing  Post-op Assessment: Report given to RN and Post -op Vital signs reviewed and stable  Post vital signs: Reviewed and stable  Last Vitals:  Vitals Value Taken Time  BP 174/88 02/27/23 0910  Temp 36.7 C 02/27/23 0910  Pulse 92 02/27/23 0910  Resp 19 02/27/23 0910  SpO2 94 % 02/27/23 0910    Last Pain:  Vitals:   02/27/23 0910  TempSrc: Oral  PainSc: 0-No pain         Complications: No notable events documented.

## 2023-03-01 LAB — SURGICAL PATHOLOGY

## 2023-03-01 NOTE — Anesthesia Postprocedure Evaluation (Signed)
Anesthesia Post Note  Patient: Elizabeth Brewer  Procedure(s) Performed: COLONOSCOPY WITH PROPOFOL POLYPECTOMY  Patient location during evaluation: Phase II Anesthesia Type: General Level of consciousness: awake Pain management: pain level controlled Vital Signs Assessment: post-procedure vital signs reviewed and stable Respiratory status: spontaneous breathing and respiratory function stable Cardiovascular status: blood pressure returned to baseline and stable Postop Assessment: no headache and no apparent nausea or vomiting Anesthetic complications: no Comments: Late entry   No notable events documented.   Last Vitals:  Vitals:   02/27/23 0725 02/27/23 0910  BP: (!) 152/76 (!) 174/88  Pulse: 71 92  Resp: 18 19  Temp: 36.7 C 36.7 C  SpO2: 100% 94%    Last Pain:  Vitals:   02/28/23 1559  TempSrc:   PainSc: 0-No pain                 Windell Norfolk

## 2023-03-04 ENCOUNTER — Encounter (HOSPITAL_COMMUNITY): Payer: Self-pay | Admitting: Internal Medicine

## 2023-03-05 ENCOUNTER — Encounter: Payer: Self-pay | Admitting: Internal Medicine

## 2023-03-11 ENCOUNTER — Telehealth: Payer: Self-pay | Admitting: Family

## 2023-03-11 ENCOUNTER — Encounter: Payer: Self-pay | Admitting: Family

## 2023-03-11 ENCOUNTER — Ambulatory Visit (INDEPENDENT_AMBULATORY_CARE_PROVIDER_SITE_OTHER): Payer: Medicare Other | Admitting: Family

## 2023-03-11 VITALS — BP 141/71 | HR 87 | Temp 97.6°F | Ht 62.0 in | Wt 225.2 lb

## 2023-03-11 DIAGNOSIS — F17210 Nicotine dependence, cigarettes, uncomplicated: Secondary | ICD-10-CM

## 2023-03-11 DIAGNOSIS — R490 Dysphonia: Secondary | ICD-10-CM | POA: Diagnosis not present

## 2023-03-11 DIAGNOSIS — J449 Chronic obstructive pulmonary disease, unspecified: Secondary | ICD-10-CM

## 2023-03-11 DIAGNOSIS — I10 Essential (primary) hypertension: Secondary | ICD-10-CM | POA: Diagnosis not present

## 2023-03-11 DIAGNOSIS — F17200 Nicotine dependence, unspecified, uncomplicated: Secondary | ICD-10-CM | POA: Diagnosis not present

## 2023-03-11 MED ORDER — FLUTICASONE FUROATE-VILANTEROL 200-25 MCG/ACT IN AEPB
1.0000 | INHALATION_SPRAY | Freq: Every day | RESPIRATORY_TRACT | 2 refills | Status: DC
Start: 2023-03-11 — End: 2023-03-12

## 2023-03-11 NOTE — Telephone Encounter (Signed)
Arlys John called from pharmacy stating that Virgel Bouquet isnt covered by pts insurance because its listed as quantity of 1. Needs to be listed as quantity of 60 because there are 60 blisters in 1 inhaler.

## 2023-03-11 NOTE — Patient Instructions (Signed)
Hoarseness  Hoarseness, also called dysphonia, is any abnormal change in your voice that can make it difficult to speak. Your voice may sound raspy, breathy, or strained. Hoarseness is caused by a problem with your vocal cords (vocal folds). These are two bands of tissue inside your voice box (larynx). When you speak, your vocal cords move back and forth to create sound. The surfaces of your vocal cords need to be smooth for your voice to sound clear. Swelling or lumps on your vocal cords can cause hoarseness. Vocal cord problems may be the result of injuries or abnormal growths, certain diseases, upper respiratory infection, or allergies. Other causes may include medicine side effects and exposure to irritants. Follow these instructions at home:  Pay attention to any changes in your symptoms. Take these actions to stay safe and to help relieve your symptoms: Lifestyle Do not eat foods that give you heartburn, such as spicy or acidic foods like hot peppers and orange juice. These foods can cause a gastroesophageal reflux that may worsen your vocal cord problems. Limit how much alcohol and caffeine you drink as told by your health care provider. Drink enough fluid to keep your urine pale yellow. Do not use any products that contain nicotine or tobacco. These products include cigarettes, chewing tobacco, and vaping devices, such as e-cigarettes. If you need help quitting, ask your health care provider. Avoid secondhand smoke. General instructions Use a humidifier if the air in your home is dry. Avoid coughing or clearing your throat. Do not whisper. Whispering can cause muscle strain. Do not speak in a loud or harsh voice. Rest your voice. If recommended by your health care provider, schedule an appointment with a speech-language specialist. This specialist may give you methods to try that can help you avoid misusing your voice. Contact a health care provider if: Your voice is hoarse longer than  2 weeks. You almost lose or completely lose your voice for more than 3 days. You have pain when you swallow or try to talk. You feel a lump in your neck. Get help right away if: You have trouble swallowing. You feel like you are choking when you swallow. You cough up blood or vomit blood. You have trouble breathing. You choke, cannot swallow, or cannot breathe if you lie flat. You notice swelling or a rash on your body, face, or tongue. These symptoms may represent a serious problem that is an emergency. Do not wait to see if the symptoms will go away. Get medical help right away. Call your local emergency services (911 in the [place] Do not drive yourself to the hospital. Summary Hoarseness, also called dysphonia, is any abnormal change in your voice that can make it difficult to speak. Your voice may sound raspy, breathy, or strained. Hoarseness is caused by a problem with your vocal cords (vocal folds). Do not speak in a loud or harsh voice, whisper, use nicotine or tobacco products, or eat foods that give you heartburn. See your health care provider if your hoarseness does not improve after 2 weeks. This information is not intended to replace advice given to you by your health care provider. Make sure you discuss any questions you have with your health care provider. Document Revised: 02/07/2021 Document Reviewed: 02/07/2021 Elsevier Patient Education  2024 Elsevier Inc.  

## 2023-03-11 NOTE — Progress Notes (Signed)
Subjective:    Patient ID: Elizabeth Brewer, female    DOB: Nov 29, 1960, 62 y.o.   MRN: [id number]  Chief Complaint  Patient presents with   Hypertension   Pt presents to the office today to follow up on HTN. She was seen on 02/18/23 and we increased her Lisinopril to 40 mg from 20 mg. Her BP is improved, but not at goal.   She has had her colonoscopy since our last visit.   She continues to have hoarse voice for the last 4 months. She has been taking her zyrtec and flonase without relief. She is smoking less than a pack a day.  Hypertension This is a chronic problem. The current episode started more than 1 year ago. The problem is unchanged. The problem is controlled. Associated symptoms include malaise/fatigue, peripheral edema and shortness of breath. Risk factors for coronary artery disease include obesity, sedentary lifestyle and smoking/tobacco exposure. Past treatments include ACE inhibitors. The current treatment provides moderate improvement.      Review of Systems  Constitutional:  Positive for malaise/fatigue.  Respiratory:  Positive for shortness of breath.   All other systems reviewed and are negative.      Objective:   Physical Exam Vitals reviewed.  Constitutional:      General: She is not in acute distress.    Appearance: She is well-developed. She is obese.  HENT:     Head: Normocephalic and atraumatic.     Right Ear: Tympanic membrane normal.     Left Ear: Tympanic membrane normal.     Ears:     Comments: Hoarse voice Eyes:     Pupils: Pupils are equal, round, and reactive to light.  Neck:     Thyroid: No thyromegaly.  Cardiovascular:     Rate and Rhythm: Normal rate and regular rhythm.     Heart sounds: Normal heart sounds. No murmur heard. Pulmonary:     Effort: Pulmonary effort is normal. No respiratory distress.     Breath sounds: Normal breath sounds. No wheezing.  Abdominal:     General: Bowel sounds are normal. There is no distension.      Palpations: Abdomen is soft.     Tenderness: There is no abdominal tenderness.  Musculoskeletal:        General: No tenderness. Normal range of motion.     Cervical back: Normal range of motion and neck supple.  Skin:    General: Skin is warm and dry.  Neurological:     Mental Status: She is alert and oriented to person, place, and time.     Cranial Nerves: No cranial nerve deficit.     Deep Tendon Reflexes: Reflexes are normal and symmetric.  Psychiatric:        Behavior: Behavior normal.        Thought Content: Thought content normal.        Judgment: Judgment normal.      BP (!) 155/77   Pulse 87   Temp 97.6 F (36.4 C) (Temporal)   Ht 5\' 2"  (1.575 m)   Wt 225 lb 3.2 oz (102.2 kg)   SpO2 98%   BMI 41.19 kg/m      Assessment & Plan:  Domnique H Seal comes in today with chief complaint of Hypertension   Diagnosis and orders addressed:  1. Essential hypertension Improved, but not at goal -[employer] information given -Exercise encouraged - Stress Management  -Continue current meds - BMP8+EGFR  2. Hoarseness of voice Continue PPI  Continue zyrtec and flonase - BMP8+EGFR - Ambulatory referral to ENT  3. Current smoker Continue to discrease  - BMP8+EGFR - Ambulatory referral to ENT  4. Chronic obstructive pulmonary disease, unspecified COPD type (HCC) - fluticasone furoate-vilanterol (BREO ELLIPTA) 200-25 MCG/ACT AEPB; Inhale 1 puff into the lungs daily.  Dispense: 1 each; Refill: 2   Labs pending Health Maintenance reviewed Diet and exercise encouraged  Follow up plan: 3 months    [name], FNP

## 2023-03-12 LAB — BMP8+EGFR
BUN/Creatinine Ratio: 10 — ABNORMAL LOW (ref 12–28)
BUN: 8 mg/dL (ref 8–27)
CO2: 24 mmol/L (ref 20–29)
Calcium: 9.1 mg/dL (ref 8.7–10.3)
Chloride: 98 mmol/L (ref 96–106)
Creatinine, Ser: 0.82 mg/dL (ref 0.57–1.00)
Glucose: 167 mg/dL — ABNORMAL HIGH (ref 70–99)
Potassium: 3.5 mmol/L (ref 3.5–5.2)
Sodium: 138 mmol/L (ref 134–144)
eGFR: 81 mL/min/{1.73_m2} (ref >59)

## 2023-03-12 MED ORDER — FLUTICASONE FUROATE-VILANTEROL 200-25 MCG/ACT IN AEPB
1.0000 | INHALATION_SPRAY | Freq: Every day | RESPIRATORY_TRACT | 2 refills | Status: DC
Start: 2023-03-12 — End: 2024-01-21

## 2023-03-12 NOTE — Telephone Encounter (Signed)
Prescription sent to pharmacy.

## 2023-03-13 ENCOUNTER — Other Ambulatory Visit: Payer: Self-pay | Admitting: Family

## 2023-03-13 DIAGNOSIS — F4321 Adjustment disorder with depressed mood: Secondary | ICD-10-CM

## 2023-03-13 DIAGNOSIS — F419 Anxiety disorder, unspecified: Secondary | ICD-10-CM

## 2023-03-13 LAB — SPECIMEN STATUS REPORT

## 2023-03-14 ENCOUNTER — Other Ambulatory Visit: Payer: Self-pay | Admitting: Family Medicine

## 2023-03-14 DIAGNOSIS — R739 Hyperglycemia, unspecified: Secondary | ICD-10-CM

## 2023-03-15 ENCOUNTER — Other Ambulatory Visit: Payer: Self-pay | Admitting: Family

## 2023-03-15 ENCOUNTER — Ambulatory Visit: Payer: Medicare Other

## 2023-03-15 DIAGNOSIS — R739 Hyperglycemia, unspecified: Secondary | ICD-10-CM | POA: Diagnosis not present

## 2023-03-15 LAB — BAYER DCA HB A1C WAIVED: HB A1C (BAYER DCA - WAIVED): 7 % — ABNORMAL HIGH (ref 4.8–5.6)

## 2023-03-15 MED ORDER — SEMAGLUTIDE(0.25 OR 0.5MG/DOS) 2 MG/3ML ~~LOC~~ SOPN: 0.2500 mg | PEN_INJECTOR | SUBCUTANEOUS | 0 refills | Status: DC

## 2023-03-18 ENCOUNTER — Telehealth: Payer: Self-pay | Admitting: Family

## 2023-03-18 NOTE — Telephone Encounter (Signed)
Patient aware it was sent on the 12th to the drug store.

## 2023-03-18 NOTE — Telephone Encounter (Signed)
Patient said PCP was supposed to call in Ozempic after she had her lab work. Please send to Drug Store in [place]

## 2023-03-19 ENCOUNTER — Other Ambulatory Visit: Payer: Self-pay | Admitting: Family

## 2023-03-19 ENCOUNTER — Other Ambulatory Visit (HOSPITAL_COMMUNITY): Payer: Self-pay

## 2023-03-19 ENCOUNTER — Telehealth: Payer: Self-pay

## 2023-03-19 ENCOUNTER — Telehealth: Payer: Self-pay | Admitting: Family

## 2023-03-19 NOTE — Telephone Encounter (Signed)
Pharmacy Patient Advocate Encounter   Received notification from Pt Calls Messages that prior authorization for Ozempic (0.25 or 0.5 MG/DOSE) 2MG /3ML pen-injectors is required/requested.   Insurance verification completed.   The patient is insured through [facility] .   Per test claim: PA submitted to [employer] via CoverMyMeds Key/confirmation #/EOC [id number] Status is pending

## 2023-03-19 NOTE — Telephone Encounter (Signed)
Pt called to let us know that her Ozempic Rx is requiring PA.

## 2023-03-19 NOTE — Telephone Encounter (Signed)
PA request has been Submitted. New Encounter created for follow up. For additional info see Pharmacy Prior Auth telephone encounter from 03/19/23.

## 2023-03-20 DIAGNOSIS — G43109 Migraine with aura, not intractable, without status migrainosus: Secondary | ICD-10-CM | POA: Diagnosis not present

## 2023-03-20 DIAGNOSIS — M542 Cervicalgia: Secondary | ICD-10-CM | POA: Diagnosis not present

## 2023-03-20 DIAGNOSIS — M25512 Pain in left shoulder: Secondary | ICD-10-CM | POA: Diagnosis not present

## 2023-03-20 NOTE — Telephone Encounter (Signed)
Pharmacy Patient Advocate Encounter  Received notification from [facility] that Prior Authorization for Ozempic (0.25 or 0.5 MG/DOSE) 2MG /3ML pen-injectors has been APPROVED from 03/19/23 to 09/03/23.Marland Kitchen  PA #/Case ID/Reference #: [id number]

## 2023-03-27 ENCOUNTER — Ambulatory Visit: Payer: Medicare Other

## 2023-03-27 DIAGNOSIS — R739 Hyperglycemia, unspecified: Secondary | ICD-10-CM

## 2023-03-27 NOTE — Progress Notes (Signed)
Patient here today for demonstration of Ozempic.  Instructions were gone over with patient and patient demonstrated understanding by injecting herself.  Patient encouraged to let us know if she has any questions.

## 2023-04-08 ENCOUNTER — Encounter (INDEPENDENT_AMBULATORY_CARE_PROVIDER_SITE_OTHER): Payer: Self-pay | Admitting: Otolaryngology

## 2023-04-08 ENCOUNTER — Ambulatory Visit (INDEPENDENT_AMBULATORY_CARE_PROVIDER_SITE_OTHER): Payer: Medicare Other | Admitting: Otolaryngology

## 2023-04-08 VITALS — BP 156/89 | HR 75 | Ht 62.0 in | Wt 228.0 lb

## 2023-04-08 DIAGNOSIS — K219 Gastro-esophageal reflux disease without esophagitis: Secondary | ICD-10-CM | POA: Diagnosis not present

## 2023-04-08 DIAGNOSIS — J343 Hypertrophy of nasal turbinates: Secondary | ICD-10-CM

## 2023-04-08 DIAGNOSIS — R0982 Postnasal drip: Secondary | ICD-10-CM | POA: Diagnosis not present

## 2023-04-08 DIAGNOSIS — R490 Dysphonia: Secondary | ICD-10-CM | POA: Diagnosis not present

## 2023-04-08 DIAGNOSIS — R1310 Dysphagia, unspecified: Secondary | ICD-10-CM | POA: Diagnosis not present

## 2023-04-08 DIAGNOSIS — F17200 Nicotine dependence, unspecified, uncomplicated: Secondary | ICD-10-CM

## 2023-04-08 DIAGNOSIS — R0981 Nasal congestion: Secondary | ICD-10-CM

## 2023-04-08 DIAGNOSIS — J3089 Other allergic rhinitis: Secondary | ICD-10-CM

## 2023-04-08 DIAGNOSIS — K117 Disturbances of salivary secretion: Secondary | ICD-10-CM | POA: Diagnosis not present

## 2023-04-08 DIAGNOSIS — J383 Other diseases of vocal cords: Secondary | ICD-10-CM

## 2023-04-08 DIAGNOSIS — J309 Allergic rhinitis, unspecified: Secondary | ICD-10-CM

## 2023-04-08 MED ORDER — FAMOTIDINE 20 MG PO TABS: 20.0000 mg | ORAL_TABLET | Freq: Two times a day (BID) | ORAL | 1 refills | Status: DC
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
## 2023-04-08 NOTE — Patient Instructions (Addendum)
-   start Reflux Gourmet and continue reflux medication (Famotidine stop Omeprazole) - start Flonase and Clarinex (allergy pill) - continue Flonase and continue Cetirizine  - schedule voice therapy  - schedule swallow study - return after therapy and swallow study

## 2023-04-15 ENCOUNTER — Telehealth (HOSPITAL_COMMUNITY): Payer: Self-pay | Admitting: *Deleted

## 2023-04-15 ENCOUNTER — Ambulatory Visit (INDEPENDENT_AMBULATORY_CARE_PROVIDER_SITE_OTHER): Payer: Medicare Other | Admitting: Family

## 2023-04-15 ENCOUNTER — Encounter: Payer: Self-pay | Admitting: Family

## 2023-04-15 VITALS — BP 117/78 | HR 85 | Temp 97.4°F | Ht 62.0 in | Wt 226.4 lb

## 2023-04-15 DIAGNOSIS — E039 Hypothyroidism, unspecified: Secondary | ICD-10-CM

## 2023-04-15 DIAGNOSIS — I10 Essential (primary) hypertension: Secondary | ICD-10-CM

## 2023-04-15 DIAGNOSIS — I700 Atherosclerosis of aorta: Secondary | ICD-10-CM

## 2023-04-15 DIAGNOSIS — K5900 Constipation, unspecified: Secondary | ICD-10-CM

## 2023-04-15 DIAGNOSIS — E1169 Type 2 diabetes mellitus with other specified complication: Secondary | ICD-10-CM

## 2023-04-15 DIAGNOSIS — E785 Hyperlipidemia, unspecified: Secondary | ICD-10-CM

## 2023-04-15 DIAGNOSIS — J449 Chronic obstructive pulmonary disease, unspecified: Secondary | ICD-10-CM

## 2023-04-15 DIAGNOSIS — Z79899 Other long term (current) drug therapy: Secondary | ICD-10-CM | POA: Diagnosis not present

## 2023-04-15 DIAGNOSIS — F17200 Nicotine dependence, unspecified, uncomplicated: Secondary | ICD-10-CM | POA: Diagnosis not present

## 2023-04-15 DIAGNOSIS — F331 Major depressive disorder, recurrent, moderate: Secondary | ICD-10-CM

## 2023-04-15 DIAGNOSIS — Z7985 Long-term (current) use of injectable non-insulin antidiabetic drugs: Secondary | ICD-10-CM

## 2023-04-15 DIAGNOSIS — K219 Gastro-esophageal reflux disease without esophagitis: Secondary | ICD-10-CM | POA: Diagnosis not present

## 2023-04-15 DIAGNOSIS — F419 Anxiety disorder, unspecified: Secondary | ICD-10-CM | POA: Diagnosis not present

## 2023-04-15 MED ORDER — BLOOD GLUCOSE TEST VI STRP
1.0000 | ORAL_STRIP | Freq: Three times a day (TID) | 0 refills | Status: AC
Start: 2023-04-15 — End: 2023-05-15

## 2023-04-15 MED ORDER — LANCET DEVICE MISC
1.0000 | Freq: Three times a day (TID) | 0 refills | Status: AC
Start: 2023-04-15 — End: 2023-05-15

## 2023-04-15 MED ORDER — LANCETS MISC. MISC
1.0000 | Freq: Three times a day (TID) | 0 refills | Status: AC
Start: 2023-04-15 — End: 2023-05-15

## 2023-04-15 MED ORDER — BLOOD GLUCOSE MONITORING SUPPL DEVI
1.0000 | Freq: Three times a day (TID) | 0 refills | Status: AC
Start: 2023-04-15 — End: ?

## 2023-04-15 MED ORDER — SEMAGLUTIDE(0.25 OR 0.5MG/DOS) 2 MG/3ML ~~LOC~~ SOPN
0.5000 mg | PEN_INJECTOR | SUBCUTANEOUS | 0 refills | Status: DC
Start: 2023-04-15 — End: 2023-04-30

## 2023-04-15 NOTE — Progress Notes (Signed)
Subjective:    Patient ID: Elizabeth Brewer, female    DOB: 01/27/1961, 62 y.o.   MRN: [id number]  Chief Complaint  Patient presents with   Diabetes   PT presents to the office today to follow up on DM. She was started on Ozempic 0.25 mg.      04/15/2023   11:55 AM 04/08/2023   12:45 PM 03/11/2023    3:33 PM  Last 3 Weights  Weight (lbs) 226 lb 6.4 oz 228 lb 225 lb 3.2 oz  Weight (kg) 102.694 kg 103.42 kg 102.15 kg    PT is followed by a neurologists every 3 months for chronic neck pain,seizure, and migraines. These are stable at this time.    She is followed by [facility] every 3 months.     She states her brother died in Nov 14, 2022 and her husband shot himself. She has had a great deal of anxiety with this.    She has COPD and continues to smoke 1 pack a day.   She saw ENT for hoarse voice.    She is morbid obese with a BMI of 41 and HTN and Hyperlipidemia. Diabetes She presents for her follow-up diabetic visit. She has type 2 diabetes mellitus. Hypoglycemia symptoms include nervousness/anxiousness. Associated symptoms include fatigue. Pertinent negatives for diabetes include no blurred vision and no foot paresthesias. Risk factors for coronary artery disease include dyslipidemia, diabetes mellitus, hypertension, sedentary lifestyle and post-menopausal. She is following a generally unhealthy diet. (Does not check glucose )  Hypertension This is a chronic problem. The current episode started more than 1 year ago. The problem has been resolved since onset. Associated symptoms include anxiety, malaise/fatigue, neck pain and peripheral edema. Pertinent negatives include no blurred vision, palpitations or shortness of breath. Risk factors for coronary artery disease include dyslipidemia, diabetes mellitus, obesity and sedentary lifestyle. The current treatment provides moderate improvement. Identifiable causes of hypertension include a thyroid problem.  Gastroesophageal Reflux She  complains of belching, heartburn and a hoarse voice. This is a chronic problem. The current episode started more than 1 year ago. The problem occurs occasionally. Associated symptoms include fatigue. Risk factors include obesity. She has tried a PPI and a histamine-2 antagonist for the symptoms. The treatment provided mild relief.  Thyroid Problem Presents for follow-up visit. Symptoms include anxiety, constipation, fatigue and hoarse voice. Patient reports no palpitations. The symptoms have been stable. Her past medical history is significant for hyperlipidemia.  Hyperlipidemia This is a chronic problem. The current episode started more than 1 year ago. The problem is controlled. Recent lipid tests were reviewed and are normal. Exacerbating diseases include obesity. Pertinent negatives include no shortness of breath. Current antihyperlipidemic treatment includes statins. The current treatment provides moderate improvement of lipids. Risk factors for coronary artery disease include diabetes mellitus, dyslipidemia, hypertension, a sedentary lifestyle and post-menopausal.  Depression        This is a chronic problem.  The current episode started more than 1 year ago.   Associated symptoms include fatigue and sad.  Associated symptoms include no helplessness and no hopelessness.  Past medical history includes thyroid problem and anxiety.   Anxiety Presents for follow-up visit. Symptoms include excessive worry and nervous/anxious behavior. Patient reports no palpitations or shortness of breath. Symptoms occur occasionally. The severity of symptoms is moderate.    Constipation This is a chronic problem. The current episode started more than 1 year ago. The problem has been resolved since onset. Her stool frequency is 1 time  per day. Risk factors include obesity. She has tried laxatives for the symptoms. The treatment provided moderate relief.  Neck Pain  This is a chronic problem. The current episode  started more than 1 year ago. The problem occurs intermittently. The problem has been waxing and waning. The quality of the pain is described as aching. The pain is at a severity of 9/10. The pain is moderate.      Review of Systems  Constitutional:  Positive for fatigue and malaise/fatigue.  HENT:  Positive for hoarse voice.   Eyes:  Negative for blurred vision.  Respiratory:  Negative for shortness of breath.   Cardiovascular:  Negative for palpitations.  Gastrointestinal:  Positive for constipation and heartburn.  Musculoskeletal:  Positive for neck pain.  Psychiatric/Behavioral:  Positive for depression. The patient is nervous/anxious.   All other systems reviewed and are negative.      Objective:   Physical Exam Vitals reviewed.  Constitutional:      General: She is not in acute distress.    Appearance: She is well-developed. She is obese.  HENT:     Head: Normocephalic. No contusion.     Comments: Hoarse voice    Right Ear: Tympanic membrane normal.     Left Ear: Tympanic membrane normal.  Eyes:     Pupils: Pupils are equal, round, and reactive to light.  Neck:     Thyroid: No thyromegaly.  Cardiovascular:     Rate and Rhythm: Normal rate and regular rhythm.     Heart sounds: Normal heart sounds. No murmur heard. Pulmonary:     Effort: Pulmonary effort is normal. No respiratory distress.     Breath sounds: Normal breath sounds. No wheezing.  Abdominal:     General: Bowel sounds are normal. There is no distension.     Palpations: Abdomen is soft.     Tenderness: There is no abdominal tenderness.  Musculoskeletal:        General: No tenderness. Normal range of motion.     Cervical back: Normal range of motion and neck supple.  Skin:    General: Skin is warm and dry.  Neurological:     Mental Status: She is alert and oriented to person, place, and time.     Cranial Nerves: No cranial nerve deficit.     Deep Tendon Reflexes: Reflexes are normal and symmetric.   Psychiatric:        Behavior: Behavior normal.        Thought Content: Thought content normal.        Judgment: Judgment normal.       BP 117/78   Pulse 85   Temp (!) 97.4 F (36.3 C) (Temporal)   Ht 5\' 2"  (1.575 m)   Wt 226 lb 6.4 oz (102.7 kg)   SpO2 94%   BMI 41.41 kg/m      Assessment & Plan:  Elizabeth Brewer comes in today with chief complaint of Diabetes   Diagnosis and orders addressed:  1. Anxiety - AMB Referral to Pharmacy Medication Management - CMP14+EGFR  2. Aortic atherosclerosis (HCC) - AMB Referral to Pharmacy Medication Management - CMP14+EGFR  3. Constipation, unspecified constipation type - AMB Referral to Pharmacy Medication Management - CMP14+EGFR  4. Controlled substance agreement signed - AMB Referral to Pharmacy Medication Management - CMP14+EGFR  5. Chronic obstructive pulmonary disease, unspecified COPD type (HCC) - AMB Referral to Pharmacy Medication Management - CMP14+EGFR  6. Moderate episode of recurrent major depressive disorder (HCC) - AMB  Referral to Pharmacy Medication Management - CMP14+EGFR  7. Essential hypertension - AMB Referral to Pharmacy Medication Management - CMP14+EGFR  8. Gastroesophageal reflux disease, unspecified whether esophagitis present - AMB Referral to Pharmacy Medication Management - CMP14+EGFR  9. Hyperlipidemia, unspecified hyperlipidemia type - AMB Referral to Pharmacy Medication Management - CMP14+EGFR  10. Hypothyroidism, unspecified type - AMB Referral to Pharmacy Medication Management - CMP14+EGFR  11. Morbid obesity (HCC) - AMB Referral to Pharmacy Medication Management - CMP14+EGFR  12. TOBACCO ABUSE - AMB Referral to Pharmacy Medication Management - CMP14+EGFR  13. Type 2 diabetes mellitus with other specified complication, without long-term current use of insulin (HCC) Will increase Ozempic to 0.5 mg from 0.25 mg - Semaglutide,0.25 or 0.5MG /DOS, 2 MG/3ML SOPN; Inject 0.5 mg  into the skin once a week.  Dispense: 3 mL; Refill: 0 - AMB Referral to Pharmacy Medication Management - Blood Glucose Monitoring Suppl DEVI; 1 each by Does not apply route in the morning, at noon, and at bedtime. May substitute to any manufacturer covered by patient's insurance.  Dispense: 1 each; Refill: 0 - Glucose Blood (BLOOD GLUCOSE TEST STRIPS) STRP; 1 each by In Vitro route in the morning, at noon, and at bedtime. May substitute to any manufacturer covered by patient's insurance.  Dispense: 100 strip; Refill: 0 - Lancet Device MISC; 1 each by Does not apply route in the morning, at noon, and at bedtime. May substitute to any manufacturer covered by patient's insurance.  Dispense: 1 each; Refill: 0 - Lancets Misc. MISC; 1 each by Does not apply route in the morning, at noon, and at bedtime. May substitute to any manufacturer covered by patient's insurance.  Dispense: 100 each; Refill: 0 - CMP14+EGFR   Labs pending Continue current medications  Referral pending for Clinical Pharm for diabetic education  Increase Ozempic to 0.5 mg from 0.25 mg Health Maintenance reviewed Diet and exercise encouraged  Follow up plan: 3 months    [name], FNP

## 2023-04-15 NOTE — Telephone Encounter (Signed)
Attempted to contact patient to schedule OP MBS. Left VM. RKEEL 

## 2023-04-15 NOTE — Patient Instructions (Signed)

## 2023-04-16 ENCOUNTER — Other Ambulatory Visit (INDEPENDENT_AMBULATORY_CARE_PROVIDER_SITE_OTHER): Payer: Self-pay | Admitting: Otolaryngology

## 2023-04-17 ENCOUNTER — Telehealth: Payer: Self-pay

## 2023-04-17 DIAGNOSIS — Z5181 Encounter for therapeutic drug level monitoring: Secondary | ICD-10-CM | POA: Diagnosis not present

## 2023-04-17 NOTE — Progress Notes (Signed)
   Care Guide Note  04/17/2023 Name: [name] MRN: [id number] DOB: [birth year]  Referred by: [name], FNP Reason for referral : Care Coordination (Outreach to schedule with Pharm d )   Elizabeth Brewer is a 62 y.o. year old female who is a primary care patient of [name], FNP. Vinetta H Haralson was referred to the pharmacist for assistance related to DM.    Successful contact was made with the patient to discuss pharmacy services including being ready for the pharmacist to call at least 5 minutes before the scheduled appointment time, to have medication bottles and any blood sugar or blood pressure readings ready for review. The patient agreed to meet with the pharmacist via with the pharmacist via telephone visit on (date/time).  05/09/2023  Penne Lash, RMA Care Guide [facility]  [place], [place] [address] Direct Dial: [phone] [email or web address]

## 2023-04-18 ENCOUNTER — Other Ambulatory Visit: Payer: Self-pay

## 2023-04-18 ENCOUNTER — Emergency Department (HOSPITAL_COMMUNITY)
Admission: EM | Admit: 2023-04-18 | Discharge: 2023-04-19 | Disposition: A | Discharge status: Home / Self Care | Payer: Medicare Other | Attending: Emergency Medicine | Admitting: Emergency Medicine

## 2023-04-18 ENCOUNTER — Other Ambulatory Visit (HOSPITAL_COMMUNITY): Payer: Self-pay | Admitting: *Deleted

## 2023-04-18 ENCOUNTER — Encounter (HOSPITAL_COMMUNITY): Payer: Self-pay

## 2023-04-18 DIAGNOSIS — R1310 Dysphagia, unspecified: Secondary | ICD-10-CM

## 2023-04-18 DIAGNOSIS — M25562 Pain in left knee: Secondary | ICD-10-CM | POA: Diagnosis not present

## 2023-04-18 DIAGNOSIS — Z79899 Other long term (current) drug therapy: Secondary | ICD-10-CM | POA: Insufficient documentation

## 2023-04-18 DIAGNOSIS — I10 Essential (primary) hypertension: Secondary | ICD-10-CM | POA: Diagnosis not present

## 2023-04-18 DIAGNOSIS — Z79890 Hormone replacement therapy (postmenopausal): Secondary | ICD-10-CM | POA: Diagnosis not present

## 2023-04-18 DIAGNOSIS — M1712 Unilateral primary osteoarthritis, left knee: Secondary | ICD-10-CM | POA: Diagnosis not present

## 2023-04-18 DIAGNOSIS — J449 Chronic obstructive pulmonary disease, unspecified: Secondary | ICD-10-CM | POA: Diagnosis not present

## 2023-04-18 DIAGNOSIS — E039 Hypothyroidism, unspecified: Secondary | ICD-10-CM | POA: Insufficient documentation

## 2023-04-18 DIAGNOSIS — M1990 Unspecified osteoarthritis, unspecified site: Secondary | ICD-10-CM

## 2023-04-18 NOTE — ED Triage Notes (Signed)
Directed to ED from [facility] for vascular ultrasound of left knee.   Pain and subjective swelling beginning today that has required her to use a cane to ambulate.

## 2023-04-19 ENCOUNTER — Ambulatory Visit (HOSPITAL_COMMUNITY)
Admission: RE | Admit: 2023-04-19 | Discharge: 2023-04-19 | Disposition: A | Discharge status: Home / Self Care | Payer: Medicare Other | Source: Ambulatory Visit | Attending: Emergency Medicine | Admitting: Emergency Medicine

## 2023-04-19 DIAGNOSIS — M79605 Pain in left leg: Secondary | ICD-10-CM | POA: Diagnosis not present

## 2023-04-19 DIAGNOSIS — R52 Pain, unspecified: Secondary | ICD-10-CM | POA: Insufficient documentation

## 2023-04-19 MED ORDER — LIDOCAINE 5 % EX PTCH: 1.0000 | MEDICATED_PATCH | CUTANEOUS | 0 refills | Status: DC

## 2023-04-19 MED ORDER — KETOROLAC TROMETHAMINE 60 MG/2ML IM SOLN
30.0000 mg | Freq: Once | INTRAMUSCULAR | Status: AC
  Administered 2023-04-19: 30 mg via INTRAMUSCULAR
  Filled 2023-04-19: qty 2

## 2023-04-19 MED ORDER — LIDOCAINE 5 % EX PTCH
2.0000 | MEDICATED_PATCH | CUTANEOUS | Status: DC
  Administered 2023-04-19: 2 via TRANSDERMAL
  Filled 2023-04-19: qty 2
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
## 2023-04-23 DIAGNOSIS — M25562 Pain in left knee: Secondary | ICD-10-CM | POA: Diagnosis not present

## 2023-04-24 ENCOUNTER — Other Ambulatory Visit: Payer: Self-pay | Admitting: Family

## 2023-04-24 DIAGNOSIS — F419 Anxiety disorder, unspecified: Secondary | ICD-10-CM

## 2023-04-24 DIAGNOSIS — F4321 Adjustment disorder with depressed mood: Secondary | ICD-10-CM

## 2023-04-25 ENCOUNTER — Telehealth: Payer: Self-pay

## 2023-04-25 NOTE — Telephone Encounter (Signed)
Transition Care Management Follow-up Telephone Call Date of discharge and from where: [facility] 8/16 How have you been since you were released from the hospital? Doing ok and has followed up with Ortho care this week Any questions or concerns? No  Items Reviewed: Did the pt receive and understand the discharge instructions provided? Yes  Medications obtained and verified? No  Other? No  Any new allergies since your discharge? No  Dietary orders reviewed? No Do you have support at home? Yes     Follow up appointments reviewed:  PCP Hospital f/u appt confirmed? No  Scheduled to see  on  @ . Specialist Hospital f/u appt confirmed? Yes  Scheduled to see ortho on 8/20 @ . Are transportation arrangements needed? Yes  If their condition worsens, is the pt aware to call PCP or go to the Emergency Dept.? Yes Was the patient provided with contact information for the PCP's office or ED? Yes Was to pt encouraged to call back with questions or concerns? Yes

## 2023-04-30 ENCOUNTER — Encounter: Payer: Self-pay | Admitting: Family

## 2023-04-30 ENCOUNTER — Ambulatory Visit: Payer: Medicare Other | Admitting: Family

## 2023-04-30 VITALS — BP 113/65 | HR 110 | Temp 97.3°F | Ht 62.0 in | Wt 223.8 lb

## 2023-04-30 DIAGNOSIS — Z7985 Long-term (current) use of injectable non-insulin antidiabetic drugs: Secondary | ICD-10-CM | POA: Diagnosis not present

## 2023-04-30 DIAGNOSIS — F419 Anxiety disorder, unspecified: Secondary | ICD-10-CM | POA: Diagnosis not present

## 2023-04-30 DIAGNOSIS — F4321 Adjustment disorder with depressed mood: Secondary | ICD-10-CM

## 2023-04-30 DIAGNOSIS — E1169 Type 2 diabetes mellitus with other specified complication: Secondary | ICD-10-CM

## 2023-04-30 DIAGNOSIS — M25562 Pain in left knee: Secondary | ICD-10-CM | POA: Diagnosis not present

## 2023-04-30 DIAGNOSIS — M25561 Pain in right knee: Secondary | ICD-10-CM | POA: Diagnosis not present

## 2023-04-30 DIAGNOSIS — E119 Type 2 diabetes mellitus without complications: Secondary | ICD-10-CM | POA: Insufficient documentation

## 2023-04-30 MED ORDER — BUSPIRONE HCL 7.5 MG PO TABS
7.5000 mg | ORAL_TABLET | Freq: Three times a day (TID) | ORAL | 2 refills | Status: DC | PRN
Start: 2023-04-30 — End: 2024-01-21

## 2023-04-30 MED ORDER — SEMAGLUTIDE (1 MG/DOSE) 4 MG/3ML ~~LOC~~ SOPN
1.0000 mg | PEN_INJECTOR | SUBCUTANEOUS | 2 refills | Status: DC
Start: 2023-04-30 — End: 2023-09-06

## 2023-04-30 MED ORDER — BUSPIRONE HCL 7.5 MG PO TABS
7.5000 mg | ORAL_TABLET | Freq: Three times a day (TID) | ORAL | 2 refills | Status: DC | PRN
Start: 2023-04-30 — End: 2023-04-30

## 2023-04-30 NOTE — Progress Notes (Signed)
Subjective:    Patient ID: Elizabeth Brewer, female    DOB: 18-Jun-1961, 62 y.o.   MRN: [id number]  Chief Complaint  Patient presents with   Medical Management of Chronic Issues   Pt presents to the office today for refill of Buspar and discuss Ozempic.   She has been on Ozempic 0.25 mg for one month and has increased to 0.5 mg. Has lost 3 lbs.      04/30/2023    3:08 PM 04/18/2023    9:08 PM 04/15/2023   11:55 AM  Last 3 Weights  Weight (lbs) 223 lb 12.8 oz 226 lb 226 lb 6.4 oz  Weight (kg) 101.515 kg 102.513 kg 102.694 kg     Anxiety Presents for follow-up visit. Symptoms include excessive worry, nervous/anxious behavior and restlessness. Symptoms occur occasionally. The severity of symptoms is moderate.    Arthritis Presents for follow-up visit. She complains of pain and stiffness. Affected locations include the left knee and right knee. Her pain is at a severity of 8/10.  Diabetes She presents for her follow-up diabetic visit. She has type 2 diabetes mellitus. Hypoglycemia symptoms include nervousness/anxiousness. Pertinent negatives for diabetes include no blurred vision and no foot paresthesias. Symptoms are stable.      Review of Systems  Eyes:  Negative for blurred vision.  Musculoskeletal:  Positive for arthritis and stiffness.  Psychiatric/Behavioral:  The patient is nervous/anxious.   All other systems reviewed and are negative.      Objective:   Physical Exam Vitals reviewed.  Constitutional:      General: She is not in acute distress.    Appearance: She is well-developed. She is obese.  HENT:     Head: Normocephalic and atraumatic.  Eyes:     Pupils: Pupils are equal, round, and reactive to light.  Neck:     Thyroid: No thyromegaly.  Cardiovascular:     Rate and Rhythm: Normal rate and regular rhythm.     Heart sounds: Normal heart sounds. No murmur heard. Pulmonary:     Effort: Pulmonary effort is normal. No respiratory distress.     Breath  sounds: Normal breath sounds. No wheezing.  Abdominal:     General: Bowel sounds are normal. There is no distension.     Palpations: Abdomen is soft.     Tenderness: There is no abdominal tenderness.  Musculoskeletal:        General: No tenderness. Normal range of motion.     Cervical back: Normal range of motion and neck supple.  Skin:    General: Skin is warm and dry.  Neurological:     Mental Status: She is alert and oriented to person, place, and time.     Cranial Nerves: No cranial nerve deficit.     Deep Tendon Reflexes: Reflexes are normal and symmetric.  Psychiatric:        Mood and Affect: Affect is flat.        Behavior: Behavior normal.        Thought Content: Thought content normal.        Judgment: Judgment normal.       BP 113/65   Pulse (!) 110   Temp (!) 97.3 F (36.3 C) (Temporal)   Ht 5\' 2"  (1.575 m)   Wt 223 lb 12.8 oz (101.5 kg)   SpO2 96%   BMI 40.93 kg/m      Assessment & Plan:  Elizabeth Brewer comes in today with chief complaint of Medical Management  of Chronic Issues   Diagnosis and orders addressed:  1. Anxiety Continue current medications  Stress management  - busPIRone (BUSPAR) 7.5 MG tablet; Take 1 tablet (7.5 mg total) by mouth 3 (three) times daily as needed.  Dispense: 270 tablet; Refill: 2  2. Grief - busPIRone (BUSPAR) 7.5 MG tablet; Take 1 tablet (7.5 mg total) by mouth 3 (three) times daily as needed.  Dispense: 270 tablet; Refill: 2  3. Arthralgia of both lower legs  4. Type 2 diabetes mellitus with other specified complication, without long-term current use of insulin (HCC) Will increase Ozempic to 1 mg  Low carb diet  Encourage healthy diet and exercise  - Semaglutide, 1 MG/DOSE, 4 MG/3ML SOPN; Inject 1 mg into the skin once a week.  Dispense: 3 mL; Refill: 2   Labs pending Health Maintenance reviewed Diet and exercise encouraged  Follow up plan: Keep chronic follow up  Elizabeth Rodney, FNP

## 2023-04-30 NOTE — Patient Instructions (Signed)

## 2023-05-02 ENCOUNTER — Ambulatory Visit (HOSPITAL_COMMUNITY)
Admission: RE | Admit: 2023-05-02 | Discharge: 2023-05-02 | Disposition: A | Discharge status: Home / Self Care | Payer: Medicare Other | Source: Ambulatory Visit | Attending: Family | Admitting: Family

## 2023-05-02 ENCOUNTER — Ambulatory Visit (HOSPITAL_COMMUNITY): Payer: Medicare Other

## 2023-05-02 DIAGNOSIS — R1310 Dysphagia, unspecified: Secondary | ICD-10-CM

## 2023-05-08 NOTE — Progress Notes (Signed)
05/09/2023 Name: Elizabeth Brewer MRN: [id number] DOB: [birth year]  Chief Complaint  Patient presents with   Diabetes    Subjective:  Elizabeth Brewer is a 62 y.o. year old female with past medical history of hypertension, COPD GERD, hypothyroidism, hyperlipidemia, type 2 diabetes, depression, and migraines  who was referred for medication management by their primary care provider, [name], FNP. They presented for a face to face visit today. They were referred to the pharmacist by their PCP for assistance in managing diabetes.   She was recently diagnosed with diabetes in July 2024. She does have a history of gestational diabetes diagnosed in the 58's. This was managed with diet control. From her knowledge she has never taken Metformin or any other medication for diabetes. Since starting Ozempic 6 weeks ago she has noticed ~4lb weight loss. She denied any GI side effects including nausea, vomiting, and diarrhea. She often experiences constipation due to opioid use.    Care Team: Primary Care Provider: [name], FNP ; Next Scheduled Visit: 07/16/2023  Medication Access/Adherence  Current Pharmacy:  [facility] - [place], [place] - [address] [place] [address] Phone: [phone] Fax: [phone]  [facility]) - [place], [place] - [address] [facility] [address] [place] [address] [place] [address] Phone: [phone] Fax: [phone]  [facility] - [place], [place] - [address] [place] [address] Phone: [phone] Fax: [phone]  [id number] - [place], [place] - [address] [place] [address] Phone: [phone] Fax: [phone]   Patient reports affordability concerns with their medications: No  Patient reports access/transportation concerns to their pharmacy: No  Patient reports adherence concerns  with their medications:  No     Diabetes:  Current medications:  Ozempic 1mg  once weekly (last increased 04/30/2023)  Medications tried in the past: None   Prior to her clinic visit today she had not started using her BG meter. She was counseled on proper blood glucose POC testing technique. She was able to demonstrate her understanding in clinic today.   BG reading in clinic: 154 (she had not eaten but did have some Mountain dew today) Now using OneTouch Verio Reflect meter  Patient denies hypoglycemic s/sx including dizziness, shakiness, sweating. Patient denies hyperglycemic symptoms including polyuria, polydipsia, polyphagia, nocturia, neuropathy, blurred vision.  Current meal patterns:   - Breakfast: Skips breakfast  - Lunch and Dinner: She rarely cooks because she lives alone and prefers to eat out. She does not enjoy many vegetables, except green beans.  - Snacks: She tries to avoid snacking.  - Drinks: [employer] (~1 L); she does not like drinking water.   Objective:  Lab Results  Component Value Date   HGBA1C 7.0 (H) 03/15/2023    Lab Results  Component Value Date   CREATININE 0.85 04/15/2023   BUN 5 (L) 04/15/2023   NA 139 04/15/2023   K 3.8 04/15/2023   CL 99 04/15/2023   CO2 25 04/15/2023    Lab Results  Component Value Date   CHOL 143 01/28/2020   HDL 58 01/28/2020   LDLCALC 67 01/28/2020   TRIG 98 01/28/2020   CHOLHDL 2.5 01/28/2020    Medications Reviewed Today     Reviewed by Gwenlyn Found, RPH (Pharmacist) on 05/09/23 at 1418  Med List Status: <None>   Medication Order Taking? Sig  Documenting Provider Last Dose Status Informant  ARIPiprazole (ABILIFY) 15 MG tablet [id number]  Take 15 mg by mouth at bedtime. [provider]  Active Self  Blood Glucose Monitoring Suppl DEVI [id number]  1 each by Does not apply route in the morning, at noon, and at bedtime. May substitute to any manufacturer covered by patient's insurance. [name], FNP  Active     Discontinued 03/27/19 1209 (Reorder)   busPIRone (BUSPAR) 7.5 MG tablet [id number]  Take 1 tablet (7.5 mg total) by mouth 3 (three) times daily as needed. [name], FNP  Active   cetirizine (ZYRTEC ALLERGY) 10 MG tablet [id number]  Take 1 tablet (10 mg total) by mouth daily. [name], FNP  Active Self  citalopram (CELEXA) 40 MG tablet [id number]  TAKE ONE (1) TABLET EACH DAY Hawks, [place] A, FNP  Active Self  clonazePAM (KLONOPIN) 1 MG tablet [id number]  TAKE 1 TABLET DAILY AS NEEDED FOR ANXIETY Hawks, Christy A, FNP  Active Self  cyanocobalamin (,VITAMIN B-12,) 1000 MCG/ML injection [id number]  Inject 1,000 mcg into the muscle every 30 (thirty) days. [provider]  Active Self  doxepin (SINEQUAN) 75 MG capsule [id number]  Take 75 mg by mouth at bedtime. [provider]  Active Self  famotidine (PEPCID) 20 MG tablet [id number]  Take 1 tablet (20 mg total) by mouth 2 (two) times daily. Ashok Croon, MD  Active   fluticasone (FLONASE) 50 MCG/ACT nasal spray [id number]  Place 2 sprays into both nostrils daily. Hawks, Neysa Bonito A, FNP  Active Self  fluticasone furoate-vilanterol (BREO ELLIPTA) 200-25 MCG/ACT AEPB [id number]  Inhale 1 puff into the lungs daily. [name], FNP  Active   folic acid (FOLVITE) 1 MG tablet [id number]  TAKE 3 TABLETS BY MOUTH DAILY Hawks, Christy A, FNP  Active Self  furosemide (LASIX) 20 MG tablet [id number]  TAKE ONE (1) TABLET BY MOUTH EVERY DAY  Patient taking differently: Take 20 mg by mouth daily as needed for fluid or edema.   [name], FNP  Active Self           Med Note Joella Prince A   Tue Feb 19, 2023  2:31 PM) Pt ran out, needs to get more  gabapentin (NEURONTIN) 300 MG capsule [id number]  Take 300 mg by mouth 3 (three) times daily. [provider]  Active Self  Glucose Blood (BLOOD GLUCOSE TEST STRIPS) STRP [id number]  1 each by In Vitro route in the morning, at noon, and at bedtime.  May substitute to any manufacturer covered by patient's insurance. [name], FNP  Active   hydrochlorothiazide (MICROZIDE) 12.5 MG capsule [id number]  TAKE 1 CAPSULE BY MOUTH DAILY Hawks, Christy A, FNP  Active   ketoconazole (NIZORAL) 2 % cream [id number]  Apply 1 Application topically daily as needed for irritation. [provider]  Active Self  Lancet Device MISC [id number]  1 each by Does not apply route in the morning, at noon, and at bedtime. May substitute to any manufacturer covered by patient's insurance. [name], FNP  Active   Lancets Misc. MISC [id number]  1 each by Does not apply route in the morning, at noon, and at bedtime. May substitute to any manufacturer covered by patient's insurance. [name], FNP  Active   levothyroxine (SYNTHROID) 112 MCG tablet [id number]  Take 1 tablet (112 mcg total) by mouth daily. [name], FNP  Active Self  lidocaine (LIDODERM) 5 % [id number]  Place  1 patch onto the skin daily. Remove & Discard patch within 12 hours or as directed by MD Palumbo, April, MD  Active   lisinopril (ZESTRIL) 40 MG tablet [id number]  Take 1 tablet (40 mg total) by mouth daily. [name], FNP  Active Self  Melatonin 10 MG CAPS [id number]  Take 10 mg by mouth at bedtime as needed (sleep). [provider]  Active Self  Multiple Vitamin (MULTIVITAMIN WITH MINERALS) TABS tablet [id number]  Take 1 tablet by mouth daily. [provider]  Active Self  omeprazole (PRILOSEC) 40 MG capsule [id number]  TAKE 1 CAPSULE BY MOUTH DAILY Letta Median, PA-C  Active Self  Oxycodone HCl 10 MG TABS [id number]  Take 10 mg by mouth 3 (three) times daily as needed. [provider]  Active Self  polyethylene glycol (MIRALAX / GLYCOLAX) 17 g packet [id number]  Take 17 g by mouth every other day. [provider]  Active Self  potassium chloride SA (KLOR-CON M) 20 MEQ tablet [id number]  TAKE 1 TABLET BY MOUTH TWICE  DAILY [name], FNP  Active Self  rosuvastatin (CRESTOR) 20 MG tablet [id number]  TAKE 1 TABLET BY MOUTH IN THE  Ebro, Christy A, FNP  Active   Semaglutide, 1 MG/DOSE, 4 MG/3ML SOPN [id number] Yes Inject 1 mg into the skin once a week. [name], FNP Taking Active   SUMAtriptan (IMITREX) 100 MG tablet [id number]  Take 100 mg by mouth every 2 (two) hours as needed for migraine. [provider]  Active Self  topiramate (TOPAMAX) 200 MG tablet [id number]  TAKE 1 TABLET BY MOUTH DAILY Hawks, Edilia Bo, FNP  Active Self             Assessment/Plan:   Diabetes: - Currently uncontrolled. A1c 7. Her BG in clinic today was 154.  - Reviewed long term cardiovascular and renal outcomes of uncontrolled blood sugar.  - Reviewed goal A1c, goal fasting, and goal 2 hour post prandial glucose: -Fasting BG:  80-130 -Post-prandial: <180 -A1c: < 7 - Reviewed dietary modifications including incorporating more vegetables (which could also help with her opioid-induced constipation), increasing water intake (incorporating cucumbers or strawberries in her water to replace [facility]), portion control, and increasing protein by eating things like peanut butter, grilled chicken, and fish. We also discussed the benefits of the Healthy Plate Diet to help control blood sugar.  - Consider increasing Ozempic to 2 mg in 3 weeks.   - Recommend to check glucose once daily before first meal of the day and whenever she experiences signs/symptoms of hypoglycemia (fatigue, shakiness, dizziness, sweating).    Follow Up Plan: In four weeks for a telephonic visit for possible Ozempic 2mg  increase.   Sofie Rower, PharmD, [facility] PGY1   Kieth Brightly, PharmD, BCACP Clinical Pharmacist, [facility]

## 2023-05-09 ENCOUNTER — Other Ambulatory Visit (INDEPENDENT_AMBULATORY_CARE_PROVIDER_SITE_OTHER): Payer: Self-pay | Admitting: Otolaryngology

## 2023-05-09 ENCOUNTER — Ambulatory Visit: Payer: Medicare Other

## 2023-05-09 ENCOUNTER — Ambulatory Visit (INDEPENDENT_AMBULATORY_CARE_PROVIDER_SITE_OTHER): Payer: Medicare Other | Admitting: Pharmacist

## 2023-05-09 DIAGNOSIS — E1169 Type 2 diabetes mellitus with other specified complication: Secondary | ICD-10-CM | POA: Diagnosis not present

## 2023-05-09 DIAGNOSIS — Z7985 Long-term (current) use of injectable non-insulin antidiabetic drugs: Secondary | ICD-10-CM

## 2023-05-09 DIAGNOSIS — R1310 Dysphagia, unspecified: Secondary | ICD-10-CM

## 2023-05-09 NOTE — Progress Notes (Signed)
Needs up to date order for swallow study

## 2023-05-10 DIAGNOSIS — S83242A Other tear of medial meniscus, current injury, left knee, initial encounter: Secondary | ICD-10-CM | POA: Diagnosis not present

## 2023-05-10 DIAGNOSIS — M25462 Effusion, left knee: Secondary | ICD-10-CM | POA: Diagnosis not present

## 2023-05-10 DIAGNOSIS — M948X6 Other specified disorders of cartilage, lower leg: Secondary | ICD-10-CM | POA: Diagnosis not present

## 2023-05-10 DIAGNOSIS — M25562 Pain in left knee: Secondary | ICD-10-CM | POA: Diagnosis not present

## 2023-05-14 ENCOUNTER — Telehealth: Payer: Self-pay | Admitting: Family

## 2023-05-14 ENCOUNTER — Other Ambulatory Visit: Payer: Self-pay | Admitting: Family

## 2023-05-14 NOTE — Telephone Encounter (Signed)
VO given for change of manufacter for Levothyroxine

## 2023-05-16 ENCOUNTER — Ambulatory Visit (HOSPITAL_COMMUNITY)
Admission: RE | Admit: 2023-05-16 | Discharge: 2023-05-16 | Disposition: A | Discharge status: Home / Self Care | Payer: Medicare Other | Source: Ambulatory Visit

## 2023-05-16 ENCOUNTER — Ambulatory Visit (HOSPITAL_COMMUNITY)
Admission: RE | Admit: 2023-05-16 | Discharge: 2023-05-16 | Disposition: A | Discharge status: Home / Self Care | Payer: Medicare Other | Source: Ambulatory Visit | Attending: Otolaryngology | Admitting: Otolaryngology

## 2023-05-16 DIAGNOSIS — K449 Diaphragmatic hernia without obstruction or gangrene: Secondary | ICD-10-CM | POA: Insufficient documentation

## 2023-05-16 DIAGNOSIS — R1310 Dysphagia, unspecified: Secondary | ICD-10-CM

## 2023-05-16 DIAGNOSIS — K224 Dyskinesia of esophagus: Secondary | ICD-10-CM | POA: Diagnosis not present

## 2023-05-16 DIAGNOSIS — R490 Dysphonia: Secondary | ICD-10-CM | POA: Insufficient documentation

## 2023-05-23 ENCOUNTER — Other Ambulatory Visit (INDEPENDENT_AMBULATORY_CARE_PROVIDER_SITE_OTHER): Payer: Self-pay

## 2023-05-23 ENCOUNTER — Other Ambulatory Visit (INDEPENDENT_AMBULATORY_CARE_PROVIDER_SITE_OTHER): Payer: Self-pay | Admitting: Otolaryngology

## 2023-05-23 ENCOUNTER — Other Ambulatory Visit: Payer: Self-pay | Admitting: Family

## 2023-05-23 DIAGNOSIS — R490 Dysphonia: Secondary | ICD-10-CM

## 2023-05-23 MED ORDER — FAMOTIDINE 20 MG PO TABS: 20.0000 mg | ORAL_TABLET | Freq: Two times a day (BID) | ORAL | 1 refills | Status: DC

## 2023-05-24 ENCOUNTER — Other Ambulatory Visit: Payer: Self-pay | Admitting: Family

## 2023-05-29 ENCOUNTER — Ambulatory Visit (HOSPITAL_COMMUNITY): Payer: Medicare Other

## 2023-05-30 ENCOUNTER — Other Ambulatory Visit (INDEPENDENT_AMBULATORY_CARE_PROVIDER_SITE_OTHER): Payer: Medicare Other | Admitting: Pharmacist

## 2023-05-30 DIAGNOSIS — E1169 Type 2 diabetes mellitus with other specified complication: Secondary | ICD-10-CM

## 2023-05-30 DIAGNOSIS — Z7985 Long-term (current) use of injectable non-insulin antidiabetic drugs: Secondary | ICD-10-CM

## 2023-06-11 ENCOUNTER — Ambulatory Visit: Payer: Medicare Other | Admitting: Family

## 2023-06-12 DIAGNOSIS — S83232A Complex tear of medial meniscus, current injury, left knee, initial encounter: Secondary | ICD-10-CM | POA: Diagnosis not present

## 2023-06-12 DIAGNOSIS — M1712 Unilateral primary osteoarthritis, left knee: Secondary | ICD-10-CM | POA: Diagnosis not present

## 2023-06-12 DIAGNOSIS — M65962 Unspecified synovitis and tenosynovitis, left lower leg: Secondary | ICD-10-CM | POA: Diagnosis not present

## 2023-06-12 DIAGNOSIS — G8918 Other acute postprocedural pain: Secondary | ICD-10-CM | POA: Diagnosis not present

## 2023-06-12 DIAGNOSIS — S83242A Other tear of medial meniscus, current injury, left knee, initial encounter: Secondary | ICD-10-CM | POA: Diagnosis not present

## 2023-06-12 DIAGNOSIS — M6752 Plica syndrome, left knee: Secondary | ICD-10-CM | POA: Diagnosis not present

## 2023-06-12 DIAGNOSIS — M2242 Chondromalacia patellae, left knee: Secondary | ICD-10-CM | POA: Diagnosis not present

## 2023-06-18 DIAGNOSIS — Z9889 Other specified postprocedural states: Secondary | ICD-10-CM | POA: Diagnosis not present

## 2023-06-18 DIAGNOSIS — R262 Difficulty in walking, not elsewhere classified: Secondary | ICD-10-CM | POA: Diagnosis not present

## 2023-06-18 DIAGNOSIS — M25662 Stiffness of left knee, not elsewhere classified: Secondary | ICD-10-CM | POA: Diagnosis not present

## 2023-06-20 DIAGNOSIS — M542 Cervicalgia: Secondary | ICD-10-CM | POA: Diagnosis not present

## 2023-06-20 DIAGNOSIS — M25512 Pain in left shoulder: Secondary | ICD-10-CM | POA: Diagnosis not present

## 2023-06-20 DIAGNOSIS — G43109 Migraine with aura, not intractable, without status migrainosus: Secondary | ICD-10-CM | POA: Diagnosis not present

## 2023-07-16 ENCOUNTER — Ambulatory Visit (INDEPENDENT_AMBULATORY_CARE_PROVIDER_SITE_OTHER): Payer: Medicare Other | Admitting: Family

## 2023-07-16 ENCOUNTER — Encounter: Payer: Self-pay | Admitting: Family

## 2023-07-16 VITALS — BP 103/66 | HR 78 | Temp 97.9°F | Ht 62.0 in | Wt 214.6 lb

## 2023-07-16 DIAGNOSIS — E1169 Type 2 diabetes mellitus with other specified complication: Secondary | ICD-10-CM

## 2023-07-16 DIAGNOSIS — J449 Chronic obstructive pulmonary disease, unspecified: Secondary | ICD-10-CM | POA: Diagnosis not present

## 2023-07-16 DIAGNOSIS — I700 Atherosclerosis of aorta: Secondary | ICD-10-CM | POA: Diagnosis not present

## 2023-07-16 DIAGNOSIS — E039 Hypothyroidism, unspecified: Secondary | ICD-10-CM | POA: Diagnosis not present

## 2023-07-16 DIAGNOSIS — K219 Gastro-esophageal reflux disease without esophagitis: Secondary | ICD-10-CM | POA: Diagnosis not present

## 2023-07-16 DIAGNOSIS — I10 Essential (primary) hypertension: Secondary | ICD-10-CM

## 2023-07-16 DIAGNOSIS — Z0001 Encounter for general adult medical examination with abnormal findings: Secondary | ICD-10-CM | POA: Diagnosis not present

## 2023-07-16 DIAGNOSIS — E785 Hyperlipidemia, unspecified: Secondary | ICD-10-CM | POA: Diagnosis not present

## 2023-07-16 DIAGNOSIS — Z0000 Encounter for general adult medical examination without abnormal findings: Secondary | ICD-10-CM | POA: Diagnosis not present

## 2023-07-16 DIAGNOSIS — Z23 Encounter for immunization: Secondary | ICD-10-CM

## 2023-07-16 DIAGNOSIS — F331 Major depressive disorder, recurrent, moderate: Secondary | ICD-10-CM | POA: Diagnosis not present

## 2023-07-16 DIAGNOSIS — F17200 Nicotine dependence, unspecified, uncomplicated: Secondary | ICD-10-CM | POA: Diagnosis not present

## 2023-07-16 DIAGNOSIS — E559 Vitamin D deficiency, unspecified: Secondary | ICD-10-CM

## 2023-07-16 DIAGNOSIS — F419 Anxiety disorder, unspecified: Secondary | ICD-10-CM

## 2023-07-16 LAB — BAYER DCA HB A1C WAIVED: HB A1C (BAYER DCA - WAIVED): 5.7 % — ABNORMAL HIGH (ref 4.8–5.6)

## 2023-07-16 NOTE — Patient Instructions (Signed)

## 2023-07-16 NOTE — Progress Notes (Signed)
Subjective:    Patient ID: Elizabeth Brewer, female    DOB: [birth year], 62 y.o.   MRN: [id number]  Chief Complaint  Patient presents with   Medical Management of Chronic Issues   PT presents to the office today for CPE and to follow up on DM. She was started on Ozempic 1 mg. She has lost 14 lbs.      07/16/2023   11:46 AM 04/30/2023    3:08 PM 04/18/2023    9:08 PM  Last 3 Weights  Weight (lbs) 214 lb 9.6 oz 223 lb 12.8 oz 226 lb  Weight (kg) 97.342 kg 101.515 kg 102.513 kg     PT is followed by a neurologists every 3 months for chronic neck pain,seizure, and migraines. These are stable at this time.    She is followed by [facility] every 3 months.     She states her brother died in 30-Oct-2022 and her husband shot himself. She has had a great deal of anxiety with this.    She has COPD and continues to smoke 1/2 pack a day. Has intermittent SOB and cough. Using Deans daily.    She saw ENT for hoarse voice.   Has aortic atherosclerosis and takes Crestor 20 mg daily.    She is morbid obese with a BMI of 39 and HTN and Hyperlipidemia. Hypertension This is a chronic problem. The current episode started more than 1 year ago. The problem has been resolved since onset. The problem is controlled. Associated symptoms include anxiety, malaise/fatigue, peripheral edema and shortness of breath. Pertinent negatives include no blurred vision. Risk factors for coronary artery disease include dyslipidemia, diabetes mellitus, obesity and sedentary lifestyle. The current treatment provides moderate improvement. Identifiable causes of hypertension include a thyroid problem.  Gastroesophageal Reflux She complains of belching, heartburn and a hoarse voice. This is a chronic problem. The current episode started more than 1 year ago. The problem occurs occasionally. Associated symptoms include fatigue. Risk factors include obesity. She has tried a PPI for the symptoms. The treatment provided moderate  relief.  Thyroid Problem Presents for follow-up visit. Symptoms include anxiety, constipation, depressed mood, fatigue and hoarse voice. The symptoms have been stable. Her past medical history is significant for hyperlipidemia.  Diabetes She presents for her follow-up diabetic visit. She has type 2 diabetes mellitus. Hypoglycemia symptoms include nervousness/anxiousness. Associated symptoms include fatigue. Pertinent negatives for diabetes include no blurred vision and no foot paresthesias. Symptoms are stable. Risk factors for coronary artery disease include dyslipidemia, diabetes mellitus, hypertension, sedentary lifestyle and post-menopausal. She is following a generally healthy diet. Her overall blood glucose range is 140-180 mg/dl.  Hyperlipidemia This is a chronic problem. The current episode started more than 1 year ago. The problem is controlled. Recent lipid tests were reviewed and are normal. Exacerbating diseases include obesity. Associated symptoms include shortness of breath. Current antihyperlipidemic treatment includes statins. The current treatment provides moderate improvement of lipids. Risk factors for coronary artery disease include diabetes mellitus, dyslipidemia, a sedentary lifestyle, hypertension and post-menopausal.  Depression        This is a chronic problem.  The current episode started more than 1 year ago.   The problem occurs intermittently.  Associated symptoms include fatigue, restlessness and sad.  Associated symptoms include no helplessness and no hopelessness.  Past medical history includes thyroid problem and anxiety.   Constipation This is a chronic problem. The current episode started more than 1 year ago. Her stool frequency is 1 time  per week or less. Risk factors include obesity. She has tried fiber for the symptoms. The treatment provided mild relief.  Anxiety Presents for follow-up visit. Symptoms include depressed mood, excessive worry, irritability,  nervous/anxious behavior, restlessness and shortness of breath. Symptoms occur occasionally. The severity of symptoms is moderate.        Review of Systems  Constitutional:  Positive for fatigue, irritability and malaise/fatigue.  HENT:  Positive for hoarse voice.   Eyes:  Negative for blurred vision.  Respiratory:  Positive for shortness of breath.   Gastrointestinal:  Positive for constipation and heartburn.  Psychiatric/Behavioral:  Positive for depression. The patient is nervous/anxious.   All other systems reviewed and are negative.      Objective:   Physical Exam Vitals reviewed.  Constitutional:      General: She is not in acute distress.    Appearance: She is well-developed. She is obese.  HENT:     Head: Normocephalic and atraumatic.     Right Ear: Tympanic membrane normal.     Left Ear: Tympanic membrane normal.  Eyes:     Pupils: Pupils are equal, round, and reactive to light.  Neck:     Thyroid: No thyromegaly.  Cardiovascular:     Rate and Rhythm: Normal rate and regular rhythm.     Heart sounds: Normal heart sounds. No murmur heard. Pulmonary:     Effort: Pulmonary effort is normal. No respiratory distress.     Breath sounds: Normal breath sounds. No wheezing.  Abdominal:     General: Bowel sounds are normal. There is no distension.     Palpations: Abdomen is soft.     Tenderness: There is no abdominal tenderness.  Musculoskeletal:        General: No tenderness. Normal range of motion.     Cervical back: Normal range of motion and neck supple.  Skin:    General: Skin is warm and dry.  Neurological:     Mental Status: She is alert and oriented to person, place, and time.     Cranial Nerves: No cranial nerve deficit.     Deep Tendon Reflexes: Reflexes are normal and symmetric.  Psychiatric:        Behavior: Behavior normal.        Thought Content: Thought content normal.        Judgment: Judgment normal.       BP 103/66   Pulse 78   Temp 97.9  F (36.6 C) (Temporal)   Ht 5\' 2"  (1.575 m)   Wt 214 lb 9.6 oz (97.3 kg)   SpO2 97%   BMI 39.25 kg/m      Assessment & Plan:  Catilyn H Fitzgibbons comes in today with chief complaint of Medical Management of Chronic Issues   Diagnosis and orders addressed:  1. Annual physical exam - Bayer DCA Hb A1c Waived - CBC with Differential/Platelet - CMP14+EGFR - Lipid panel - TSH  2. Hypothyroidism, unspecified type - CBC with Differential/Platelet - CMP14+EGFR - TSH  3. Hyperlipidemia, unspecified hyperlipidemia type - CBC with Differential/Platelet - CMP14+EGFR - Lipid panel  4. Moderate episode of recurrent major depressive disorder (HCC) - CBC with Differential/Platelet - CMP14+EGFR  5. TOBACCO ABUSE  - CBC with Differential/Platelet - CMP14+EGFR  6. Essential hypertension - CBC with Differential/Platelet - CMP14+EGFR  7. Gastroesophageal reflux disease, unspecified whether esophagitis present - CBC with Differential/Platelet - CMP14+EGFR  8. Anxiety - CBC with Differential/Platelet - CMP14+EGFR  9. Vitamin D deficiency - CBC with  Differential/Platelet - CMP14+EGFR  10. Morbid obesity (HCC) - CBC with Differential/Platelet - CMP14+EGFR  11. Chronic obstructive pulmonary disease, unspecified COPD type (HCC) - CBC with Differential/Platelet - CMP14+EGFR  12. Aortic atherosclerosis (HCC) - CBC with Differential/Platelet - CMP14+EGFR  13. Type 2 diabetes mellitus with other specified complication, without long-term current use of insulin (HCC) - Bayer DCA Hb A1c Waived - CBC with Differential/Platelet - CMP14+EGFR   Labs pending Continue current medications  Will continue Ozempic 1 mg at this time given weight loss Health Maintenance reviewed Diet and exercise encouraged  Follow up plan: 3 months    [name], FNP

## 2023-07-17 LAB — CBC WITH DIFFERENTIAL/PLATELET
Basophils Absolute: 0 10*3/uL (ref 0.0–0.2)
Basos: 0 %
EOS (ABSOLUTE): 0.2 10*3/uL (ref 0.0–0.4)
Eos: 2 %
Hematocrit: 39.5 % (ref 34.0–46.6)
Hemoglobin: 13.5 g/dL (ref 11.1–15.9)
Immature Grans (Abs): 0.1 10*3/uL (ref 0.0–0.1)
Immature Granulocytes: 1 %
Lymphocytes Absolute: 1.6 10*3/uL (ref 0.7–3.1)
Lymphs: 18 %
MCH: 33.4 pg — ABNORMAL HIGH (ref 26.6–33.0)
MCHC: 34.2 g/dL (ref 31.5–35.7)
MCV: 98 fL — ABNORMAL HIGH (ref 79–97)
Monocytes Absolute: 0.4 10*3/uL (ref 0.1–0.9)
Monocytes: 4 %
Neutrophils Absolute: 6.5 10*3/uL (ref 1.4–7.0)
Neutrophils: 75 %
Platelets: 224 10*3/uL (ref 150–450)
RBC: 4.04 x10E6/uL (ref 3.77–5.28)
RDW: 13.7 % (ref 11.7–15.4)
WBC: 8.7 10*3/uL (ref 3.4–10.8)

## 2023-07-17 LAB — LIPID PANEL
Chol/HDL Ratio: 4.7 ratio — ABNORMAL HIGH (ref 0.0–4.4)
Cholesterol, Total: 246 mg/dL — ABNORMAL HIGH (ref 100–199)
HDL: 52 mg/dL (ref >39)
LDL Chol Calc (NIH): 178 mg/dL — ABNORMAL HIGH (ref 0–99)
Triglycerides: 91 mg/dL (ref 0–149)
VLDL Cholesterol Cal: 16 mg/dL (ref 5–40)

## 2023-07-17 LAB — CMP14+EGFR
ALT: 19 [IU]/L (ref 0–32)
AST: 20 [IU]/L (ref 0–40)
Albumin: 3.9 g/dL (ref 3.9–4.9)
Alkaline Phosphatase: 88 [IU]/L (ref 44–121)
BUN/Creatinine Ratio: 10 — ABNORMAL LOW (ref 12–28)
BUN: 10 mg/dL (ref 8–27)
Bilirubin Total: 0.6 mg/dL (ref 0.0–1.2)
CO2: 24 mmol/L (ref 20–29)
Calcium: 9.5 mg/dL (ref 8.7–10.3)
Chloride: 89 mmol/L — ABNORMAL LOW (ref 96–106)
Creatinine, Ser: 1.04 mg/dL — ABNORMAL HIGH (ref 0.57–1.00)
Globulin, Total: 2.6 g/dL (ref 1.5–4.5)
Glucose: 136 mg/dL — ABNORMAL HIGH (ref 70–99)
Potassium: 3.5 mmol/L (ref 3.5–5.2)
Sodium: 128 mmol/L — ABNORMAL LOW (ref 134–144)
Total Protein: 6.5 g/dL (ref 6.0–8.5)
eGFR: 61 mL/min/{1.73_m2} (ref >59)

## 2023-07-17 LAB — TSH: TSH: 41.5 u[IU]/mL — ABNORMAL HIGH (ref 0.450–4.500)

## 2023-07-18 ENCOUNTER — Other Ambulatory Visit: Payer: Self-pay | Admitting: Family

## 2023-07-18 DIAGNOSIS — E039 Hypothyroidism, unspecified: Secondary | ICD-10-CM

## 2023-07-19 ENCOUNTER — Other Ambulatory Visit: Payer: Self-pay | Admitting: Family

## 2023-07-19 DIAGNOSIS — E876 Hypokalemia: Secondary | ICD-10-CM

## 2023-07-25 ENCOUNTER — Ambulatory Visit (INDEPENDENT_AMBULATORY_CARE_PROVIDER_SITE_OTHER): Payer: Medicare Other | Admitting: Otolaryngology

## 2023-08-01 ENCOUNTER — Other Ambulatory Visit: Payer: Self-pay | Admitting: Family

## 2023-08-02 ENCOUNTER — Other Ambulatory Visit: Payer: Self-pay | Admitting: Family

## 2023-08-03 ENCOUNTER — Other Ambulatory Visit: Payer: Self-pay | Admitting: Family

## 2023-08-03 DIAGNOSIS — E039 Hypothyroidism, unspecified: Secondary | ICD-10-CM

## 2023-08-06 ENCOUNTER — Ambulatory Visit: Payer: Medicare Other | Admitting: Family

## 2023-08-08 ENCOUNTER — Ambulatory Visit (HOSPITAL_COMMUNITY)
Admission: RE | Admit: 2023-08-08 | Discharge: 2023-08-08 | Disposition: A | Discharge status: Home / Self Care | Payer: Medicare Other | Source: Ambulatory Visit | Attending: Family | Admitting: Family

## 2023-08-08 DIAGNOSIS — Z1231 Encounter for screening mammogram for malignant neoplasm of breast: Secondary | ICD-10-CM | POA: Insufficient documentation

## 2023-08-17 ENCOUNTER — Other Ambulatory Visit: Payer: Self-pay | Admitting: Family

## 2023-08-21 ENCOUNTER — Other Ambulatory Visit: Payer: Self-pay | Admitting: Family

## 2023-08-21 DIAGNOSIS — R490 Dysphonia: Secondary | ICD-10-CM

## 2023-08-26 ENCOUNTER — Encounter (INDEPENDENT_AMBULATORY_CARE_PROVIDER_SITE_OTHER): Payer: Self-pay | Admitting: Otolaryngology

## 2023-08-26 ENCOUNTER — Ambulatory Visit (INDEPENDENT_AMBULATORY_CARE_PROVIDER_SITE_OTHER): Payer: Medicare Other | Admitting: Otolaryngology

## 2023-08-26 VITALS — BP 185/96 | HR 89

## 2023-08-26 DIAGNOSIS — R1310 Dysphagia, unspecified: Secondary | ICD-10-CM | POA: Diagnosis not present

## 2023-08-26 DIAGNOSIS — F17200 Nicotine dependence, unspecified, uncomplicated: Secondary | ICD-10-CM | POA: Diagnosis not present

## 2023-08-26 DIAGNOSIS — J383 Other diseases of vocal cords: Secondary | ICD-10-CM

## 2023-08-26 DIAGNOSIS — K117 Disturbances of salivary secretion: Secondary | ICD-10-CM | POA: Diagnosis not present

## 2023-08-26 DIAGNOSIS — F17210 Nicotine dependence, cigarettes, uncomplicated: Secondary | ICD-10-CM | POA: Diagnosis not present

## 2023-08-26 DIAGNOSIS — J3089 Other allergic rhinitis: Secondary | ICD-10-CM

## 2023-08-26 DIAGNOSIS — K219 Gastro-esophageal reflux disease without esophagitis: Secondary | ICD-10-CM | POA: Diagnosis not present

## 2023-08-26 DIAGNOSIS — J309 Allergic rhinitis, unspecified: Secondary | ICD-10-CM | POA: Diagnosis not present

## 2023-08-26 DIAGNOSIS — R490 Dysphonia: Secondary | ICD-10-CM

## 2023-08-26 DIAGNOSIS — J449 Chronic obstructive pulmonary disease, unspecified: Secondary | ICD-10-CM

## 2023-08-26 DIAGNOSIS — R0982 Postnasal drip: Secondary | ICD-10-CM | POA: Diagnosis not present

## 2023-08-26 DIAGNOSIS — R0981 Nasal congestion: Secondary | ICD-10-CM

## 2023-08-26 DIAGNOSIS — R053 Chronic cough: Secondary | ICD-10-CM

## 2023-08-26 DIAGNOSIS — J343 Hypertrophy of nasal turbinates: Secondary | ICD-10-CM
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
## 2023-08-29 ENCOUNTER — Telehealth: Payer: Medicare Other | Admitting: Family

## 2023-08-29 ENCOUNTER — Encounter: Payer: Self-pay | Admitting: Family

## 2023-08-29 DIAGNOSIS — I10 Essential (primary) hypertension: Secondary | ICD-10-CM

## 2023-08-29 DIAGNOSIS — E039 Hypothyroidism, unspecified: Secondary | ICD-10-CM | POA: Diagnosis not present

## 2023-08-29 NOTE — Progress Notes (Signed)
Virtual Visit Consent   Elizabeth Brewer, you are scheduled for a virtual visit with a [facility] provider today. Just as with appointments in the office, your consent must be obtained to participate. Your consent will be active for this visit and any virtual visit you may have with one of our providers in the next 365 days. If you have a MyChart account, a copy of this consent can be sent to you electronically.  As this is a virtual visit, video technology does not allow for your provider to perform a traditional examination. This may limit your provider's ability to fully assess your condition. If your provider identifies any concerns that need to be evaluated in person or the need to arrange testing (such as labs, EKG, etc.), we will make arrangements to do so. Although advances in technology are sophisticated, we cannot ensure that it will always work on either your end or our end. If the connection with a video visit is poor, the visit may have to be switched to a telephone visit. With either a video or telephone visit, we are not always able to ensure that we have a secure connection.  By engaging in this virtual visit, you consent to the provision of healthcare and authorize for your insurance to be billed (if applicable) for the services provided during this visit. Depending on your insurance coverage, you may receive a charge related to this service.  I need to obtain your verbal consent now. Are you willing to proceed with your visit today? Elizabeth Brewer has provided verbal consent on 08/29/2023 for a virtual visit (video or telephone). Elizabeth Rodney, FNP  Date: 08/29/2023 12:12 PM  Virtual Visit via Video Note   I, Elizabeth Brewer, connected with  Elizabeth Brewer  ([id number], 09/14/1960) on 08/29/23 at 11:55 AM EST by a video-enabled telemedicine application and verified that I am speaking with the correct person using two identifiers.  Location: Patient: Virtual Visit Location Patient:  Home Provider: Virtual Visit Location Provider: Home Office   I discussed the limitations of evaluation and management by telemedicine and the availability of in person appointments. The patient expressed understanding and agreed to proceed.    History of Present Illness: Elizabeth Brewer is a 62 y.o. who identifies as a female who was assigned female at birth, and is being seen today for abnormal TSH. She was seen on 07/16/23 and had a TSH 41.5. PT reports she had missed placed her levothyroxine 112 mcg. She restarted this and is feeling slightly better.   HPI: Thyroid Problem Presents for follow-up visit. Symptoms include fatigue and hoarse voice. Patient reports no constipation. The symptoms have been stable.  Hypertension This is a chronic problem. The current episode started more than 1 year ago. The problem has been resolved since onset. The problem is controlled. Associated symptoms include malaise/fatigue. Pertinent negatives include no peripheral edema or shortness of breath. The current treatment provides moderate improvement. Identifiable causes of hypertension include a thyroid problem.    Problems: Patient Active Problem List   Diagnosis Date Noted   Diabetes mellitus (HCC) 04/30/2023   Aortic atherosclerosis (HCC) 10/29/2022   Dysphagia 04/12/2022   History of colonic polyps 04/12/2022   Localized edema 01/28/2020   Cervical myelopathy (HCC) 08/20/2019   Folic acid deficiency 03/30/2019   Hypokalemia 03/30/2019   Controlled substance agreement signed 06/16/2018   Benzodiazepine dependence (HCC) 06/16/2018   COPD (chronic obstructive pulmonary disease) (HCC) 12/06/2017   Osteopenia 08/09/2016   Chronic  neck pain 08/06/2016   Anxiety 08/06/2016   Migraine 08/06/2016   Vitamin D deficiency 08/06/2016   Morbid obesity (HCC) 08/06/2016   Constipation 08/06/2016   Bilateral carpal tunnel syndrome 12/02/2015   Closed fracture of odontoid process of axis (HCC) 03/23/2015    FH: colon cancer 12/11/2013   Family hx of colon cancer requiring screening colonoscopy 12/11/2013   KNEE PAIN, BILATERAL 05/04/2009   Hypothyroidism 08/13/2008   ALLERGIC RHINITIS, SEASONAL 01/13/2008   RESTRICTIVE LUNG DISEASE 10/10/2007   POLYCYTHEMIA 03/28/2007   Depression 03/28/2007   Hyperlipemia 03/21/2007   TOBACCO ABUSE 03/21/2007   CATARACT NOS 03/21/2007   Essential hypertension 03/21/2007   GERD 03/21/2007   Arthropathy 03/21/2007    Allergies: No Known Allergies Medications:  Current Outpatient Medications:    ARIPiprazole (ABILIFY) 15 MG tablet, Take 15 mg by mouth at bedtime., Disp: , Rfl:    Blood Glucose Monitoring Suppl DEVI, 1 each by Does not apply route in the morning, at noon, and at bedtime. May substitute to any manufacturer covered by patient's insurance., Disp: 1 each, Rfl: 0   busPIRone (BUSPAR) 7.5 MG tablet, Take 1 tablet (7.5 mg total) by mouth 3 (three) times daily as needed., Disp: 270 tablet, Rfl: 2   cetirizine (ZYRTEC) 10 MG tablet, TAKE 1 TABLET BY MOUTH EVERY DAY, Disp: 90 tablet, Rfl: 1   citalopram (CELEXA) 40 MG tablet, TAKE ONE (1) TABLET EACH DAY, Disp: 90 tablet, Rfl: 1   clonazePAM (KLONOPIN) 1 MG tablet, TAKE 1 TABLET DAILY AS NEEDED FOR ANXIETY, Disp: 30 tablet, Rfl: 2   cyanocobalamin (,VITAMIN B-12,) 1000 MCG/ML injection, Inject 1,000 mcg into the muscle every 30 (thirty) days., Disp: , Rfl:    doxepin (SINEQUAN) 75 MG capsule, Take 75 mg by mouth at bedtime., Disp: , Rfl:    famotidine (PEPCID) 20 MG tablet, Take 1 tablet (20 mg total) by mouth 2 (two) times daily., Disp: 180 tablet, Rfl: 1   fluticasone (FLONASE) 50 MCG/ACT nasal spray, SPRAY 2 SPRAYS INTO EACH NOSTRIL EVERY DAY, Disp: 48 mL, Rfl: 2   fluticasone furoate-vilanterol (BREO ELLIPTA) 200-25 MCG/ACT AEPB, Inhale 1 puff into the lungs daily., Disp: 60 each, Rfl: 2   folic acid (FOLVITE) 1 MG tablet, TAKE 3 TABLETS BY MOUTH DAILY, Disp: 300 tablet, Rfl: 0   gabapentin  (NEURONTIN) 300 MG capsule, Take 300 mg by mouth 3 (three) times daily., Disp: , Rfl:    hydrochlorothiazide (MICROZIDE) 12.5 MG capsule, TAKE 1 CAPSULE BY MOUTH DAILY, Disp: 90 capsule, Rfl: 3   levothyroxine (SYNTHROID) 112 MCG tablet, TAKE 1 TABLET BY MOUTH DAILY, Disp: 100 tablet, Rfl: 0   lisinopril (ZESTRIL) 40 MG tablet, Take 1 tablet (40 mg total) by mouth daily., Disp: 90 tablet, Rfl: 3   Melatonin 10 MG CAPS, Take 10 mg by mouth at bedtime as needed (sleep)., Disp: , Rfl:    Multiple Vitamin (MULTIVITAMIN WITH MINERALS) TABS tablet, Take 1 tablet by mouth daily., Disp: , Rfl:    omeprazole (PRILOSEC) 40 MG capsule, TAKE 1 CAPSULE BY MOUTH DAILY, Disp: 100 capsule, Rfl: 2   Oxycodone HCl 10 MG TABS, Take 10 mg by mouth 3 (three) times daily as needed., Disp: , Rfl:    polyethylene glycol (MIRALAX / GLYCOLAX) 17 g packet, Take 17 g by mouth every other day., Disp: , Rfl:    potassium chloride SA (KLOR-CON M) 20 MEQ tablet, TAKE 1 TABLET BY MOUTH TWICE  DAILY, Disp: 200 tablet, Rfl: 0   rosuvastatin (  CRESTOR) 20 MG tablet, TAKE 1 TABLET BY MOUTH IN THE  EVENING, Disp: 100 tablet, Rfl: 0   Semaglutide, 1 MG/DOSE, 4 MG/3ML SOPN, Inject 1 mg into the skin once a week., Disp: 3 mL, Rfl: 2   SUMAtriptan (IMITREX) 100 MG tablet, Take 100 mg by mouth every 2 (two) hours as needed for migraine., Disp: , Rfl:    topiramate (TOPAMAX) 200 MG tablet, TAKE 1 TABLET BY MOUTH DAILY, Disp: 100 tablet, Rfl: 0  Observations/Objective: Patient is well-developed, well-nourished in no acute distress.  Resting comfortably  at home.  Head is normocephalic, atraumatic.  No labored breathing.  Speech is clear and coherent with logical content.  Patient is alert and oriented at baseline.    Assessment and Plan: 1. Hypothyroidism, unspecified type (Primary) - TSH; Future  2. Essential hypertension  Labs pending  Discussed importance of taking medications daily Continue current medications  Follow up in  2 months    Follow Up Instructions: I discussed the assessment and treatment plan with the patient. The patient was provided an opportunity to ask questions and all were answered. The patient agreed with the plan and demonstrated an understanding of the instructions.  A copy of instructions were sent to the patient via MyChart unless otherwise noted below.     The patient was advised to call back or seek an in-person evaluation if the symptoms worsen or if the condition fails to improve as anticipated.    Elizabeth Rodney, FNP

## 2023-09-02 ENCOUNTER — Telehealth: Payer: Self-pay

## 2023-09-02 NOTE — Telephone Encounter (Signed)
Elizabeth Brewer (Key: [id number]) Ozempic (0.25 or 0.5 MG/DOSE) 2MG /3ML pen-injectors Form [employer] Part D Electronic Prior Authorization Form (2017 NCPDP) Created 6 days ago Sent to Plan 3 minutes ago Plan Response 3 minutes ago Submit Clinical Questions less than a minute ago Determination Wait for Determination Please wait for [employer] 2017 NCPDP to return a determination.

## 2023-09-03 ENCOUNTER — Other Ambulatory Visit (HOSPITAL_COMMUNITY): Payer: Self-pay

## 2023-09-03 NOTE — Telephone Encounter (Signed)
 Pharmacy Patient Advocate Encounter  Received notification from [employer] that Prior Authorization for Ozempic  (0.25 or 0.5 MG/DOSE) 2MG /3ML pen-injectors has been APPROVED from 09/03/23 to 09/02/24. Ran test claim, Copay is $243.53. This test claim was processed through [facility]- copay amounts may vary at other pharmacies due to pharmacy/plan contracts, or as the patient moves through the different stages of their insurance plan.   PA #/Case ID/Reference #: [id number]

## 2023-09-05 ENCOUNTER — Telehealth: Payer: Self-pay

## 2023-09-05 DIAGNOSIS — E1169 Type 2 diabetes mellitus with other specified complication: Secondary | ICD-10-CM

## 2023-09-05 NOTE — Telephone Encounter (Signed)
 Copied from CRM [id number]. Topic: Clinical - Medication Question >> Sep 05, 2023  3:36 PM Elizabeth Brewer wrote: Reason for OZH:[id number] stated Insurance company denied her Ozempic. Pat need clinic to appeal for med necessity

## 2023-09-06 ENCOUNTER — Other Ambulatory Visit (HOSPITAL_COMMUNITY): Payer: Self-pay

## 2023-09-06 ENCOUNTER — Telehealth: Payer: Self-pay

## 2023-09-06 MED ORDER — SEMAGLUTIDE (1 MG/DOSE) 4 MG/3ML ~~LOC~~ SOPN: 1.0000 mg | PEN_INJECTOR | SUBCUTANEOUS | 2 refills | Status: DC

## 2023-09-06 NOTE — Telephone Encounter (Signed)
 Pharmacy Patient Advocate Encounter  Received notification from [employer] that Prior Authorization for Ozempic  (0.25 or 0.5 MG/DOSE) 2MG /3ML pen-injectors has been APPROVED from 09/06/23 to 09/05/24. Ran test claim, Copay is $302.00. This test claim was processed through [facility]- copay amounts may vary at other pharmacies due to pharmacy/plan contracts, or as the patient moves through the different stages of their insurance plan.   PA #/Case ID/Reference #: [facility]

## 2023-09-06 NOTE — Addendum Note (Signed)
 Addended by: [name] on: 09/06/2023 01:49 PM   Modules accepted: Orders

## 2023-09-06 NOTE — Telephone Encounter (Signed)
 I have resent medications, our PA team should get approved.

## 2023-09-18 DIAGNOSIS — G43109 Migraine with aura, not intractable, without status migrainosus: Secondary | ICD-10-CM | POA: Diagnosis not present

## 2023-09-18 DIAGNOSIS — M542 Cervicalgia: Secondary | ICD-10-CM | POA: Diagnosis not present

## 2023-09-18 DIAGNOSIS — Z79899 Other long term (current) drug therapy: Secondary | ICD-10-CM | POA: Diagnosis not present

## 2023-09-18 DIAGNOSIS — M5459 Other low back pain: Secondary | ICD-10-CM | POA: Diagnosis not present

## 2023-09-18 DIAGNOSIS — E538 Deficiency of other specified B group vitamins: Secondary | ICD-10-CM | POA: Diagnosis not present

## 2023-09-27 ENCOUNTER — Other Ambulatory Visit: Payer: Self-pay | Admitting: Family

## 2023-09-27 DIAGNOSIS — E876 Hypokalemia: Secondary | ICD-10-CM

## 2023-10-10 ENCOUNTER — Other Ambulatory Visit: Payer: Self-pay | Admitting: Family

## 2023-10-10 ENCOUNTER — Ambulatory Visit: Payer: Self-pay | Admitting: Family

## 2023-10-10 DIAGNOSIS — E039 Hypothyroidism, unspecified: Secondary | ICD-10-CM
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
## 2023-10-11 ENCOUNTER — Encounter: Payer: Self-pay | Admitting: Family Medicine

## 2023-10-11 ENCOUNTER — Ambulatory Visit (HOSPITAL_BASED_OUTPATIENT_CLINIC_OR_DEPARTMENT_OTHER)
Admission: RE | Admit: 2023-10-11 | Discharge: 2023-10-11 | Disposition: A | Discharge status: Home / Self Care | Payer: Medicare Other | Source: Ambulatory Visit | Attending: Family Medicine

## 2023-10-11 ENCOUNTER — Ambulatory Visit (INDEPENDENT_AMBULATORY_CARE_PROVIDER_SITE_OTHER): Payer: Medicare Other | Admitting: Family Medicine

## 2023-10-11 VITALS — BP 161/78 | HR 94 | Temp 97.9°F | Wt 222.4 lb

## 2023-10-11 DIAGNOSIS — R220 Localized swelling, mass and lump, head: Secondary | ICD-10-CM

## 2023-10-11 DIAGNOSIS — S0511XA Contusion of eyeball and orbital tissues, right eye, initial encounter: Secondary | ICD-10-CM | POA: Insufficient documentation

## 2023-10-11 DIAGNOSIS — W19XXXA Unspecified fall, initial encounter: Secondary | ICD-10-CM | POA: Insufficient documentation

## 2023-10-11 DIAGNOSIS — S0512XA Contusion of eyeball and orbital tissues, left eye, initial encounter: Secondary | ICD-10-CM | POA: Insufficient documentation

## 2023-10-11 DIAGNOSIS — S0990XA Unspecified injury of head, initial encounter: Secondary | ICD-10-CM | POA: Insufficient documentation

## 2023-10-11 DIAGNOSIS — R9082 White matter disease, unspecified: Secondary | ICD-10-CM | POA: Diagnosis not present
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
## 2023-10-17 ENCOUNTER — Other Ambulatory Visit (INDEPENDENT_AMBULATORY_CARE_PROVIDER_SITE_OTHER): Payer: Self-pay | Admitting: Otolaryngology

## 2023-10-17 ENCOUNTER — Other Ambulatory Visit: Payer: Self-pay | Admitting: Family

## 2023-10-17 ENCOUNTER — Ambulatory Visit: Payer: Medicare Other | Admitting: Family

## 2023-10-17 DIAGNOSIS — I10 Essential (primary) hypertension: Secondary | ICD-10-CM

## 2023-10-17 DIAGNOSIS — R490 Dysphonia: Secondary | ICD-10-CM

## 2023-10-18 ENCOUNTER — Encounter: Payer: Self-pay | Admitting: Family

## 2023-10-18 ENCOUNTER — Telehealth (INDEPENDENT_AMBULATORY_CARE_PROVIDER_SITE_OTHER): Payer: Medicare Other | Admitting: Family

## 2023-10-18 DIAGNOSIS — E039 Hypothyroidism, unspecified: Secondary | ICD-10-CM | POA: Diagnosis not present

## 2023-10-18 DIAGNOSIS — E1169 Type 2 diabetes mellitus with other specified complication: Secondary | ICD-10-CM | POA: Diagnosis not present

## 2023-10-18 DIAGNOSIS — F419 Anxiety disorder, unspecified: Secondary | ICD-10-CM | POA: Diagnosis not present

## 2023-10-18 DIAGNOSIS — K5900 Constipation, unspecified: Secondary | ICD-10-CM

## 2023-10-18 DIAGNOSIS — F17200 Nicotine dependence, unspecified, uncomplicated: Secondary | ICD-10-CM | POA: Diagnosis not present

## 2023-10-18 DIAGNOSIS — I10 Essential (primary) hypertension: Secondary | ICD-10-CM | POA: Diagnosis not present

## 2023-10-18 DIAGNOSIS — E785 Hyperlipidemia, unspecified: Secondary | ICD-10-CM | POA: Diagnosis not present

## 2023-10-18 DIAGNOSIS — F1320 Sedative, hypnotic or anxiolytic dependence, uncomplicated: Secondary | ICD-10-CM

## 2023-10-18 DIAGNOSIS — K219 Gastro-esophageal reflux disease without esophagitis: Secondary | ICD-10-CM | POA: Diagnosis not present

## 2023-10-18 DIAGNOSIS — J449 Chronic obstructive pulmonary disease, unspecified: Secondary | ICD-10-CM | POA: Diagnosis not present

## 2023-10-18 DIAGNOSIS — Z79899 Other long term (current) drug therapy: Secondary | ICD-10-CM

## 2023-10-18 DIAGNOSIS — I700 Atherosclerosis of aorta: Secondary | ICD-10-CM

## 2023-10-18 DIAGNOSIS — F331 Major depressive disorder, recurrent, moderate: Secondary | ICD-10-CM

## 2023-10-18 MED ORDER — OZEMPIC (0.25 OR 0.5 MG/DOSE) 2 MG/3ML ~~LOC~~ SOPN
0.5000 mg | PEN_INJECTOR | SUBCUTANEOUS | 2 refills | Status: DC
Start: 2023-10-18 — End: 2024-01-21

## 2023-10-18 MED ORDER — LEVOTHYROXINE SODIUM 112 MCG PO TABS
112.0000 ug | ORAL_TABLET | Freq: Every day | ORAL | 2 refills | Status: DC
Start: 2023-10-18 — End: 2024-01-22

## 2023-10-18 NOTE — Progress Notes (Signed)
Virtual Visit Consent   Elizabeth Brewer, you are scheduled for a virtual visit with a [facility] provider today. Just as with appointments in the office, your consent must be obtained to participate. Your consent will be active for this visit and any virtual visit you may have with one of our providers in the next 365 days. If you have a MyChart account, a copy of this consent can be sent to you electronically.  As this is a virtual visit, video technology does not allow for your provider to perform a traditional examination. This may limit your provider's ability to fully assess your condition. If your provider identifies any concerns that need to be evaluated in person or the need to arrange testing (such as labs, EKG, etc.), we will make arrangements to do so. Although advances in technology are sophisticated, we cannot ensure that it will always work on either your end or our end. If the connection with a video visit is poor, the visit may have to be switched to a telephone visit. With either a video or telephone visit, we are not always able to ensure that we have a secure connection.  By engaging in this virtual visit, you consent to the provision of healthcare and authorize for your insurance to be billed (if applicable) for the services provided during this visit. Depending on your insurance coverage, you may receive a charge related to this service.  I need to obtain your verbal consent now. Are you willing to proceed with your visit today? Tassie H Sirek has provided verbal consent on 10/18/2023 for a virtual visit (video or telephone). [name], FNP  Date: 10/18/2023 12:06 PM  Virtual Visit via Video Note   I, [name], connected with  Elizabeth Brewer  ([id number], 14-Dec-1960) on 10/18/23 at 11:40 AM EST by a video-enabled telemedicine application and verified that I am speaking with the correct person using two identifiers.  Location: Patient: Virtual Visit Location Patient:  Home Provider: Virtual Visit Location Provider: Home Office   I discussed the limitations of evaluation and management by telemedicine and the availability of in person appointments. The patient expressed understanding and agreed to proceed.    History of Present Illness: Elizabeth Brewer is a 63 y.o. who identifies as a female who was assigned female at birth, and is being seen today for chronic follow up.   She has been taking Ozempic 1 mg, but has not been able to get it for the last 1.5 months.    PT is followed by a neurologists every 3 months for chronic neck pain,seizure, and migraines. These are stable at this time.    She is followed by [facility] every 3 months.     She states her brother died in 2023/11/15 and her husband shot himself. She has had a great deal of anxiety with this.    She is followed by ENT for hoarseness.   She has COPD and continues to smoke 1/2 pack a day. Has intermittent SOB and cough. Using [place] daily.    Has aortic atherosclerosis and takes Crestor 20 mg daily.    She is morbid obese with a BMI of 39 and HTN and Hyperlipidemia.  HPI: Thyroid Problem Presents for follow-up visit. Symptoms include fatigue and hoarse voice. Patient reports no constipation, diarrhea or dry skin. The symptoms have been stable. Her past medical history is significant for hyperlipidemia.  Hypertension This is a chronic problem. The current episode started more than 1  year ago. The problem has been resolved since onset. The problem is controlled. Associated symptoms include malaise/fatigue. Pertinent negatives include no blurred vision, peripheral edema or shortness of breath. Risk factors for coronary artery disease include dyslipidemia, diabetes mellitus, obesity, smoking/tobacco exposure and sedentary lifestyle. The current treatment provides moderate improvement. Identifiable causes of hypertension include a thyroid problem.  Gastroesophageal Reflux She complains of  belching and a hoarse voice. This is a chronic problem. The current episode started more than 1 year ago. The problem occurs occasionally. Associated symptoms include fatigue. Risk factors include obesity. She has tried a PPI for the symptoms. The treatment provided moderate relief.  Diabetes She presents for her follow-up diabetic visit. She has type 2 diabetes mellitus. Associated symptoms include fatigue. Pertinent negatives for diabetes include no blurred vision and no foot paresthesias. Symptoms are stable. Risk factors for coronary artery disease include dyslipidemia, hypertension, diabetes mellitus and sedentary lifestyle. She is following a generally unhealthy diet. Her dinner blood glucose range is generally 140-180 mg/dl. Eye exam is current.  Hyperlipidemia This is a chronic problem. The current episode started more than 1 year ago. The problem is controlled. Recent lipid tests were reviewed and are normal. Exacerbating diseases include obesity. Pertinent negatives include no shortness of breath. Current antihyperlipidemic treatment includes statins. The current treatment provides moderate improvement of lipids. Risk factors for coronary artery disease include hypertension, a sedentary lifestyle, post-menopausal, dyslipidemia and diabetes mellitus.    Problems: Patient Active Problem List   Diagnosis Date Noted   Diabetes mellitus (HCC) 04/30/2023   Aortic atherosclerosis (HCC) 10/29/2022   Dysphagia 04/12/2022   History of colonic polyps 04/12/2022   Localized edema 01/28/2020   Cervical myelopathy (HCC) 08/20/2019   Folic acid deficiency 03/30/2019   Hypokalemia 03/30/2019   Controlled substance agreement signed 06/16/2018   Benzodiazepine dependence (HCC) 06/16/2018   COPD (chronic obstructive pulmonary disease) (HCC) 12/06/2017   Osteopenia 08/09/2016   Chronic neck pain 08/06/2016   Anxiety 08/06/2016   Migraine 08/06/2016   Vitamin D deficiency 08/06/2016   Morbid obesity  (HCC) 08/06/2016   Constipation 08/06/2016   Bilateral carpal tunnel syndrome 12/02/2015   Closed fracture of odontoid process of axis (HCC) 03/23/2015   FH: colon cancer 12/11/2013   Family hx of colon cancer requiring screening colonoscopy 12/11/2013   KNEE PAIN, BILATERAL 05/04/2009   Hypothyroidism 08/13/2008   ALLERGIC RHINITIS, SEASONAL 01/13/2008   RESTRICTIVE LUNG DISEASE 10/10/2007   POLYCYTHEMIA 03/28/2007   Depression 03/28/2007   Hyperlipemia 03/21/2007   TOBACCO ABUSE 03/21/2007   CATARACT NOS 03/21/2007   Essential hypertension 03/21/2007   GERD 03/21/2007   Arthropathy 03/21/2007    Allergies: No Known Allergies Medications:  Current Outpatient Medications:    Semaglutide,0.25 or 0.5MG /DOS, (OZEMPIC, 0.25 OR 0.5 MG/DOSE,) 2 MG/3ML SOPN, Inject 0.5 mg into the skin once a week., Disp: 3 mL, Rfl: 2   ARIPiprazole (ABILIFY) 15 MG tablet, Take 15 mg by mouth at bedtime., Disp: , Rfl:    Blood Glucose Monitoring Suppl DEVI, 1 each by Does not apply route in the morning, at noon, and at bedtime. May substitute to any manufacturer covered by patient's insurance., Disp: 1 each, Rfl: 0   busPIRone (BUSPAR) 7.5 MG tablet, Take 1 tablet (7.5 mg total) by mouth 3 (three) times daily as needed., Disp: 270 tablet, Rfl: 2   cetirizine (ZYRTEC) 10 MG tablet, TAKE 1 TABLET BY MOUTH EVERY DAY, Disp: 90 tablet, Rfl: 1   citalopram (CELEXA) 40 MG tablet, TAKE ONE (  1) TABLET EACH DAY, Disp: 90 tablet, Rfl: 1   clonazePAM (KLONOPIN) 1 MG tablet, TAKE 1 TABLET DAILY AS NEEDED FOR ANXIETY, Disp: 30 tablet, Rfl: 2   cyanocobalamin (,VITAMIN B-12,) 1000 MCG/ML injection, Inject 1,000 mcg into the muscle every 30 (thirty) days., Disp: , Rfl:    doxepin (SINEQUAN) 75 MG capsule, Take 75 mg by mouth at bedtime., Disp: , Rfl:    famotidine (PEPCID) 20 MG tablet, TAKE 1 TABLET BY MOUTH TWICE A DAY, Disp: 180 tablet, Rfl: 3   fluticasone (FLONASE) 50 MCG/ACT nasal spray, SPRAY 2 SPRAYS INTO EACH  NOSTRIL EVERY DAY, Disp: 48 mL, Rfl: 2   fluticasone furoate-vilanterol (BREO ELLIPTA) 200-25 MCG/ACT AEPB, Inhale 1 puff into the lungs daily., Disp: 60 each, Rfl: 2   folic acid (FOLVITE) 1 MG tablet, TAKE 3 TABLETS BY MOUTH DAILY, Disp: 300 tablet, Rfl: 0   gabapentin (NEURONTIN) 300 MG capsule, Take 300 mg by mouth 3 (three) times daily., Disp: , Rfl:    hydrochlorothiazide (MICROZIDE) 12.5 MG capsule, TAKE 1 CAPSULE BY MOUTH DAILY, Disp: 90 capsule, Rfl: 3   levothyroxine (SYNTHROID) 112 MCG tablet, Take 1 tablet (112 mcg total) by mouth daily., Disp: 100 tablet, Rfl: 2   lisinopril (ZESTRIL) 40 MG tablet, TAKE 1 TABLET BY MOUTH EVERY DAY, Disp: 90 tablet, Rfl: 0   Melatonin 10 MG CAPS, Take 10 mg by mouth at bedtime as needed (sleep)., Disp: , Rfl:    Multiple Vitamin (MULTIVITAMIN WITH MINERALS) TABS tablet, Take 1 tablet by mouth daily., Disp: , Rfl:    omeprazole (PRILOSEC) 40 MG capsule, TAKE 1 CAPSULE BY MOUTH DAILY, Disp: 100 capsule, Rfl: 2   Oxycodone HCl 10 MG TABS, Take 10 mg by mouth 3 (three) times daily as needed., Disp: , Rfl:    polyethylene glycol (MIRALAX / GLYCOLAX) 17 g packet, Take 17 g by mouth every other day., Disp: , Rfl:    potassium chloride SA (KLOR-CON M) 20 MEQ tablet, TAKE 1 TABLET BY MOUTH TWICE  DAILY, Disp: 200 tablet, Rfl: 0   rosuvastatin (CRESTOR) 20 MG tablet, TAKE 1 TABLET BY MOUTH IN THE  EVENING, Disp: 100 tablet, Rfl: 0   SUMAtriptan (IMITREX) 100 MG tablet, Take 100 mg by mouth every 2 (two) hours as needed for migraine., Disp: , Rfl:    topiramate (TOPAMAX) 200 MG tablet, TAKE 1 TABLET BY MOUTH DAILY, Disp: 100 tablet, Rfl: 0  Observations/Objective: Patient is well-developed, well-nourished in no acute distress.  Resting comfortably  at home.  Head is normocephalic, atraumatic.  No labored breathing.  Speech is clear and coherent with logical content.  Patient is alert and oriented at baseline.    Assessment and Plan: 1. Hypothyroidism,  unspecified type - levothyroxine (SYNTHROID) 112 MCG tablet; Take 1 tablet (112 mcg total) by mouth daily.  Dispense: 100 tablet; Refill: 2  2. Anxiety  3. Aortic atherosclerosis (HCC)  4. Benzodiazepine dependence (HCC)  5. Moderate episode of recurrent major depressive disorder (HCC)  6. Chronic obstructive pulmonary disease, unspecified COPD type (HCC)  7. Controlled substance agreement signed  8. Constipation, unspecified constipation type  9. Type 2 diabetes mellitus with other specified complication, without long-term current use of insulin (HCC) (Primary) - Semaglutide,0.25 or 0.5MG /DOS, (OZEMPIC, 0.25 OR 0.5 MG/DOSE,) 2 MG/3ML SOPN; Inject 0.5 mg into the skin once a week.  Dispense: 3 mL; Refill: 2  10. Essential hypertension  11. Gastroesophageal reflux disease, unspecified whether esophagitis present  12. Hyperlipidemia, unspecified hyperlipidemia type  13. Morbid obesity (HCC)  14. TOBACCO ABUSE   Will restart Ozempic 0.5 mg since she has been out of for the last 1.5 months  Pt will come get labs this week  Continue current medications  Follow up in 3 months    Follow Up Instructions: I discussed the assessment and treatment plan with the patient. The patient was provided an opportunity to ask questions and all were answered. The patient agreed with the plan and demonstrated an understanding of the instructions.  A copy of instructions were sent to the patient via MyChart unless otherwise noted below.     The patient was advised to call back or seek an in-person evaluation if the symptoms worsen or if the condition fails to improve as anticipated.    [name], FNP

## 2023-10-18 NOTE — Patient Instructions (Signed)

## 2023-10-21 ENCOUNTER — Other Ambulatory Visit: Payer: Self-pay | Admitting: Gastroenterology

## 2023-10-23 ENCOUNTER — Other Ambulatory Visit: Payer: Self-pay | Admitting: Family

## 2023-10-23 ENCOUNTER — Ambulatory Visit: Payer: Self-pay | Admitting: Family

## 2023-10-23 NOTE — Telephone Encounter (Signed)
Chief Complaint: Ozempic refill Symptoms: none Frequency: n/a Pertinent Negatives: Patient denies n/a Disposition: [] ED /[] Urgent Care (no appt availability in office) / [] Appointment(In office/virtual)/ []  [facility]/ [] Home Care/ [] Refused Recommended Disposition /[] [facility]/ [x]  Follow-up with PCP Additional Notes: Patient calling in to request doctor send pre-authorization over to pharmacy for Ozempic refill. Patient has been out since December and her glucose is trending higher in the 160's. Patient states she informed PCP on Friday as well. Please advise.    Copied from CRM [id number]. Topic: Clinical - Red Word Triage >> Oct 23, 2023  1:13 PM Kristie Cowman wrote: Red Word that prompted transfer to Nurse Triage: The patient is out of her Ozempic since the end of December and now her blood sugars are running high. Answer Assessment - Initial Assessment Questions 1. BLOOD GLUCOSE: "What is your blood glucose level?"      165  2. ONSET: "When did you check the blood glucose?"     Morning  3. USUAL RANGE: "What is your glucose level usually?" (e.g., usual fasting morning value, usual evening value)     100-120 4. KETONES: "Do you check for ketones (urine or blood test strips)?" If Yes, ask: "What does the test show now?"      N/a 5. TYPE 1 or 2:  "Do you know what type of diabetes you have?"  (e.g., Type 1, Type 2, Gestational; doesn't know)      Type 2 6. INSULIN: "Do you take insulin?" "What type of insulin(s) do you use? What is the mode of delivery? (syringe, pen; injection or pump)?"      No 7. DIABETES PILLS: "Do you take any pills for your diabetes?" If Yes, ask: "Have you missed taking any pills recently?"     Patient takes Ozempic 8. OTHER SYMPTOMS: "Do you have any symptoms?" (e.g., fever, frequent urination, difficulty breathing, dizziness, weakness, vomiting)     Dizzy once a while  Protocols used: Diabetes - High Blood Sugar-A-AH

## 2023-10-23 NOTE — Telephone Encounter (Unsigned)
Copied from CRM [id number]. Topic: Clinical - Medication Refill >> Oct 23, 2023  1:10 PM Kristie Cowman wrote: Most Recent Primary Care Visit:  Provider: [name]  Department: [facility]  Visit Type: MYCHART VIDEO VISIT  Date: 10/18/2023  Medication: Levothyroxine   Has the patient contacted their pharmacy? No (Agent: If no, request that the patient contact the pharmacy for the refill. If patient does not wish to contact the pharmacy document the reason why and proceed with request.) (Agent: If yes, when and what did the pharmacy advise?)  Is this the correct pharmacy for this prescription? Yes If no, delete pharmacy and type the correct one.  This is the patient's preferred pharmacy:   [id number] - [place], [place] - [address] [place] [address] Phone: [phone] Fax: [phone]   Has the prescription been filled recently? No  Is the patient out of the medication? Yes  Has the patient been seen for an appointment in the last year OR does the patient have an upcoming appointment? yes  Can we respond through MyChart? Yes  Agent: Please be advised that Rx refills may take up to 3 business days. We ask that you follow-up with your pharmacy.

## 2023-10-24 ENCOUNTER — Other Ambulatory Visit (HOSPITAL_COMMUNITY): Payer: Self-pay

## 2023-10-24 ENCOUNTER — Telehealth: Payer: Self-pay

## 2023-10-24 NOTE — Telephone Encounter (Signed)
Pharmacy Patient Advocate Encounter   Received notification from Pt Calls Messages that prior authorization for Ozempic 2mg /23ml is required/requested.   Insurance verification completed.   The patient is insured through [facility] .   Per test claim: PA required; PA submitted to above mentioned insurance via CoverMyMeds Key/confirmation #/EOC [id number] Status is pending

## 2023-10-24 NOTE — Telephone Encounter (Signed)
Pharmacy Patient Advocate Encounter  Received notification from [facility] that Prior Authorization for Ozempic 2mg /48ml has been CANCELLED due to this medication was previously approved from 09/02/23 to 09/02/24.   PA #/Case ID/Reference #: [id number]  Per test claim, refil too soon can be filled on or after 11/13/23.

## 2023-10-25 ENCOUNTER — Other Ambulatory Visit (HOSPITAL_COMMUNITY): Payer: Self-pay

## 2023-10-26 ENCOUNTER — Other Ambulatory Visit: Payer: Self-pay | Admitting: Family

## 2023-11-04 ENCOUNTER — Other Ambulatory Visit: Payer: Self-pay | Admitting: Family

## 2023-11-08 ENCOUNTER — Telehealth: Payer: Self-pay | Admitting: Family

## 2023-11-08 DIAGNOSIS — Z0279 Encounter for issue of other medical certificate: Secondary | ICD-10-CM

## 2023-11-08 NOTE — Telephone Encounter (Signed)
 Elizabeth Brewer (daughter) dropped off Handicap forms to be completed and signed.  Form Fee Paid? (Y/N)       yes     If NO, form is placed on front office manager desk to hold until payment received. If YES, then form will be placed in the RX/HH Nurse Coordinators box for completion.  Form will not be processed until payment is received   Call daughter, when form is ready!

## 2023-11-11 NOTE — Telephone Encounter (Signed)
 Daughter aware printed and taken up front up front

## 2023-11-11 NOTE — Telephone Encounter (Signed)
 Note written and went to MyChart.

## 2023-11-11 NOTE — Telephone Encounter (Signed)
 Refill provided. Needs follow-up with [facility] or Dr. Jena Gauss

## 2023-11-11 NOTE — Telephone Encounter (Signed)
 Elizabeth Brewer handicap form ready

## 2023-11-15 NOTE — Telephone Encounter (Signed)
 Patient has been scheduled

## 2023-11-26 ENCOUNTER — Ambulatory Visit: Admitting: Internal Medicine

## 2023-11-26 ENCOUNTER — Other Ambulatory Visit: Payer: Self-pay | Admitting: Family

## 2023-11-26 ENCOUNTER — Encounter: Payer: Self-pay | Admitting: Internal Medicine

## 2023-11-26 VITALS — BP 97/64 | HR 91 | Temp 98.0°F | Ht 62.0 in | Wt 222.0 lb

## 2023-11-26 DIAGNOSIS — Z860101 Personal history of adenomatous and serrated colon polyps: Secondary | ICD-10-CM | POA: Diagnosis not present

## 2023-11-26 DIAGNOSIS — Z860100 Personal history of colon polyps, unspecified: Secondary | ICD-10-CM

## 2023-11-26 DIAGNOSIS — R1012 Left upper quadrant pain: Secondary | ICD-10-CM | POA: Diagnosis not present

## 2023-11-26 DIAGNOSIS — K5900 Constipation, unspecified: Secondary | ICD-10-CM | POA: Diagnosis not present

## 2023-11-26 DIAGNOSIS — K219 Gastro-esophageal reflux disease without esophagitis: Secondary | ICD-10-CM

## 2023-11-26 DIAGNOSIS — R1310 Dysphagia, unspecified: Secondary | ICD-10-CM

## 2023-11-26 NOTE — Patient Instructions (Addendum)
 Good to see you again today  Continue omeprazole 40 mg daily best taken 30 minutes before breakfast  Plan for repeat colonoscopy 2027  Take MiraLAX 17 g every day to facilitate bowel function.  If fleeting abdominal discomfort does not go away or increases, please let me know
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
## 2023-12-06 ENCOUNTER — Other Ambulatory Visit: Payer: Self-pay | Admitting: Family

## 2023-12-06 DIAGNOSIS — E876 Hypokalemia: Secondary | ICD-10-CM

## 2023-12-18 DIAGNOSIS — G43109 Migraine with aura, not intractable, without status migrainosus: Secondary | ICD-10-CM | POA: Diagnosis not present

## 2023-12-18 DIAGNOSIS — E538 Deficiency of other specified B group vitamins: Secondary | ICD-10-CM | POA: Diagnosis not present

## 2023-12-18 DIAGNOSIS — M542 Cervicalgia: Secondary | ICD-10-CM | POA: Diagnosis not present

## 2023-12-18 DIAGNOSIS — M5459 Other low back pain: Secondary | ICD-10-CM | POA: Diagnosis not present

## 2023-12-20 ENCOUNTER — Other Ambulatory Visit: Payer: Self-pay | Admitting: Family

## 2023-12-23 ENCOUNTER — Ambulatory Visit

## 2023-12-26 ENCOUNTER — Ambulatory Visit (INDEPENDENT_AMBULATORY_CARE_PROVIDER_SITE_OTHER): Payer: Medicare Other

## 2023-12-26 VITALS — BP 97/64 | HR 91 | Ht 62.0 in | Wt 222.0 lb

## 2023-12-26 DIAGNOSIS — Z0000 Encounter for general adult medical examination without abnormal findings: Secondary | ICD-10-CM

## 2023-12-26 DIAGNOSIS — Z122 Encounter for screening for malignant neoplasm of respiratory organs: Secondary | ICD-10-CM
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
## 2023-12-26 NOTE — Patient Instructions (Signed)
 Elizabeth Brewer , Thank you for taking time to come for your Medicare Wellness Visit. I appreciate your ongoing commitment to your health goals. Please review the following plan we discussed and let me know if I can assist you in the future.   Referrals/Orders/Follow-Ups/Clinician Recommendations: As we have discussed please make sure to update the following with your provider at your next visit: Pneumonia vaccine, foot/diabetic exam, lung cancer screening, and lab draw for urine ACR levels; 01/21/24 @ 11:25am  This is a list of the screening recommended for you and due dates:  Health Maintenance  Topic Date Due   Complete foot exam   Never done   Eye exam for diabetics  Never done   Yearly kidney health urinalysis for diabetes  Never done   Pneumococcal Vaccination (2 of 2 - PCV) 03/14/2019   COVID-19 Vaccine (3 - Pfizer risk series) 08/05/2020   Screening for Lung Cancer  10/28/2023   Hemoglobin A1C  01/13/2024   Flu Shot  04/03/2024   Yearly kidney function blood test for diabetes  07/15/2024   Mammogram  08/07/2024   DEXA scan (bone density measurement)  12/16/2024   Medicare Annual Wellness Visit  12/25/2024   DTaP/Tdap/Td vaccine (2 - Td or Tdap) 03/09/2025   Colon Cancer Screening  02/26/2033   Hepatitis C Screening  Completed   HIV Screening  Completed   Zoster (Shingles) Vaccine  Completed   HPV Vaccine  Aged Out   Meningitis B Vaccine  Aged Out    Advanced directives: (Declined) Advance directive discussed with you today. Even though you declined this today, please call our office should you change your mind, and we can give you the proper paperwork for you to fill out.  Next Medicare Annual Wellness Visit scheduled for next year: Yes

## 2024-01-04 ENCOUNTER — Other Ambulatory Visit: Payer: Self-pay | Admitting: Family

## 2024-01-06 NOTE — Telephone Encounter (Signed)
  The original prescription was discontinued on 11/26/2023 by Cleone Dad, CMA for the following reason: Change in therapy. Renewing this prescription may not be appropriate.

## 2024-01-09 ENCOUNTER — Telehealth: Payer: Self-pay

## 2024-01-09 DIAGNOSIS — Z87891 Personal history of nicotine dependence: Secondary | ICD-10-CM

## 2024-01-09 DIAGNOSIS — Z122 Encounter for screening for malignant neoplasm of respiratory organs: Secondary | ICD-10-CM

## 2024-01-09 DIAGNOSIS — F17210 Nicotine dependence, cigarettes, uncomplicated: Secondary | ICD-10-CM

## 2024-01-09 NOTE — Telephone Encounter (Signed)
 Lung Cancer Screening Narrative/Criteria Questionnaire (Cigarette Smokers Only- No Cigars/Pipes/vapes)   Elizabeth Brewer   SDMV:01/28/2024 with Natalie at 2:00pm       1960/12/24   LDCT: 01/30/2024 at 2:30pm at [facility]    63 y.o.   Phone: [phone]  Lung Screening Narrative (confirm age 65-77 yrs Medicare / 50-80 yrs Private pay insurance)   Insurance information: [employer]   Referring Provider: Marylin So, NP   This screening involves an initial phone call with a team member from our program. It is called a shared decision making visit. The initial meeting is required by  insurance and Medicare to make sure you understand the program. This appointment takes about 15-20 minutes to complete. You will complete the screening scan at your scheduled date/time.  This scan takes about 5-10 minutes to complete. You can eat and drink normally before and after the scan.  Criteria questions for Lung Cancer Screening:   Are you a current or former smoker? Current Age began smoking: 61   If you are a former smoker, what year did you quit smoking? (within 15 yrs)   To calculate your smoking history, I need an accurate estimate of how many packs of cigarettes you smoked per day and for how many years. (Not just the number of PPD you are now smoking)   Years smoking 46 x Packs per day 1 = Pack years 46   (at least 20 pack yrs)   (Make sure they understand that we need to know how much they have smoked in the past, not just the number of PPD they are smoking now)  Do you have a personal history of cancer?  No    Do you have a family history of cancer? Yes  (cancer type and and relative) Mother Colon Cancer  Are you coughing up blood?  No  Have you had unexplained weight loss of 15 lbs or more in the last 6 months? No  It looks like you meet all criteria.  When would be a good time for us  to schedule you for this screening?   Additional information: N/A

## 2024-01-21 ENCOUNTER — Encounter: Payer: Self-pay | Admitting: Family

## 2024-01-21 ENCOUNTER — Ambulatory Visit (INDEPENDENT_AMBULATORY_CARE_PROVIDER_SITE_OTHER): Admitting: Family

## 2024-01-21 VITALS — BP 104/51 | HR 66 | Temp 98.0°F | Ht 62.0 in | Wt 228.0 lb

## 2024-01-21 DIAGNOSIS — F331 Major depressive disorder, recurrent, moderate: Secondary | ICD-10-CM

## 2024-01-21 DIAGNOSIS — R490 Dysphonia: Secondary | ICD-10-CM | POA: Diagnosis not present

## 2024-01-21 DIAGNOSIS — K5900 Constipation, unspecified: Secondary | ICD-10-CM

## 2024-01-21 DIAGNOSIS — K219 Gastro-esophageal reflux disease without esophagitis: Secondary | ICD-10-CM

## 2024-01-21 DIAGNOSIS — F1320 Sedative, hypnotic or anxiolytic dependence, uncomplicated: Secondary | ICD-10-CM | POA: Diagnosis not present

## 2024-01-21 DIAGNOSIS — F17200 Nicotine dependence, unspecified, uncomplicated: Secondary | ICD-10-CM

## 2024-01-21 DIAGNOSIS — F4321 Adjustment disorder with depressed mood: Secondary | ICD-10-CM | POA: Diagnosis not present

## 2024-01-21 DIAGNOSIS — E785 Hyperlipidemia, unspecified: Secondary | ICD-10-CM

## 2024-01-21 DIAGNOSIS — Z79899 Other long term (current) drug therapy: Secondary | ICD-10-CM | POA: Diagnosis not present

## 2024-01-21 DIAGNOSIS — Z716 Tobacco abuse counseling: Secondary | ICD-10-CM

## 2024-01-21 DIAGNOSIS — F419 Anxiety disorder, unspecified: Secondary | ICD-10-CM

## 2024-01-21 DIAGNOSIS — E1169 Type 2 diabetes mellitus with other specified complication: Secondary | ICD-10-CM | POA: Diagnosis not present

## 2024-01-21 DIAGNOSIS — E039 Hypothyroidism, unspecified: Secondary | ICD-10-CM | POA: Diagnosis not present

## 2024-01-21 DIAGNOSIS — J449 Chronic obstructive pulmonary disease, unspecified: Secondary | ICD-10-CM

## 2024-01-21 DIAGNOSIS — R8290 Unspecified abnormal findings in urine: Secondary | ICD-10-CM | POA: Diagnosis not present

## 2024-01-21 DIAGNOSIS — E876 Hypokalemia: Secondary | ICD-10-CM | POA: Diagnosis not present

## 2024-01-21 DIAGNOSIS — I10 Essential (primary) hypertension: Secondary | ICD-10-CM | POA: Diagnosis not present

## 2024-01-21 DIAGNOSIS — E559 Vitamin D deficiency, unspecified: Secondary | ICD-10-CM

## 2024-01-21 LAB — URINALYSIS, COMPLETE
Bilirubin, UA: NEGATIVE
Glucose, UA: NEGATIVE
Nitrite, UA: NEGATIVE
Protein,UA: NEGATIVE
RBC, UA: NEGATIVE
Specific Gravity, UA: 1.015 (ref 1.005–1.030)
Urobilinogen, Ur: 1 mg/dL (ref 0.2–1.0)
pH, UA: 7 (ref 5.0–7.5)

## 2024-01-21 LAB — BAYER DCA HB A1C WAIVED: HB A1C (BAYER DCA - WAIVED): 5.4 % (ref 4.8–5.6)

## 2024-01-21 LAB — MICROSCOPIC EXAMINATION
RBC, Urine: NONE SEEN /HPF (ref 0–2)
Renal Epithel, UA: NONE SEEN /HPF
Yeast, UA: NONE SEEN

## 2024-01-21 MED ORDER — POTASSIUM CHLORIDE CRYS ER 20 MEQ PO TBCR: 20.0000 meq | EXTENDED_RELEASE_TABLET | Freq: Two times a day (BID) | ORAL | 0 refills | Status: DC

## 2024-01-21 MED ORDER — BUSPIRONE HCL 7.5 MG PO TABS: 7.5000 mg | ORAL_TABLET | Freq: Three times a day (TID) | ORAL | 2 refills | Status: DC | PRN

## 2024-01-21 MED ORDER — SEMAGLUTIDE (1 MG/DOSE) 4 MG/3ML ~~LOC~~ SOPN: 1.0000 mg | PEN_INJECTOR | SUBCUTANEOUS | 2 refills | Status: DC

## 2024-01-21 MED ORDER — LISINOPRIL 40 MG PO TABS: 40.0000 mg | ORAL_TABLET | Freq: Every day | ORAL | 0 refills | Status: DC

## 2024-01-21 MED ORDER — FOLIC ACID 1 MG PO TABS
3.0000 mg | ORAL_TABLET | Freq: Every day | ORAL | 0 refills | Status: DC
Start: 2024-01-21 — End: 2024-06-01

## 2024-01-21 MED ORDER — LINACLOTIDE 72 MCG PO CAPS
72.0000 ug | ORAL_CAPSULE | Freq: Every day | ORAL | 1 refills | Status: DC
Start: 2024-01-21 — End: 2024-02-24

## 2024-01-21 MED ORDER — CITALOPRAM HYDROBROMIDE 40 MG PO TABS: ORAL_TABLET | ORAL | 1 refills | Status: DC

## 2024-01-21 MED ORDER — CLONAZEPAM 1 MG PO TABS: ORAL_TABLET | ORAL | 2 refills | Status: DC

## 2024-01-21 MED ORDER — FREESTYLE LIBRE 3 PLUS SENSOR MISC: 2 refills | Status: AC

## 2024-01-21 MED ORDER — NICOTINE 21 MG/24HR TD PT24
21.0000 mg | MEDICATED_PATCH | Freq: Every day | TRANSDERMAL | 2 refills | Status: DC
Start: 2024-01-21 — End: 2024-02-24

## 2024-01-21 MED ORDER — FLUTICASONE FUROATE-VILANTEROL 200-25 MCG/ACT IN AEPB: 1.0000 | INHALATION_SPRAY | Freq: Every day | RESPIRATORY_TRACT | 2 refills | Status: DC

## 2024-01-21 NOTE — Progress Notes (Signed)
 Subjective:    Patient ID: Elizabeth Brewer, female    DOB: 10-05-1960, 63 y.o.   MRN: [id number]  Chief Complaint  Patient presents with   Medical Management of Chronic Issues   PT presents to the office today for chronic follow up.   She was started on Ozempic  0.5 mg. Her starting weight was 222 lb.      01/21/2024   11:36 AM 12/26/2023   11:41 AM 11/26/2023    2:47 PM  Last 3 Weights  Weight (lbs) 228 lb 222 lb 222 lb  Weight (kg) 103.42 kg 100.699 kg 100.699 kg     PT is followed by a neurologists every 3 months for chronic neck pain,seizure, and migraines. These are stable at this time.    She is followed by [facility] every 3 months.     She states her brother died in November 25, 2023 and her husband shot himself. She has had a great deal of anxiety with this.    She has COPD and continues to smoke 1/2 pack a day. Has intermittent SOB and cough. Using Breo daily. Wants to start using nicotine patches.    She saw ENT for hoarse voice. They cleared her.   Has aortic atherosclerosis and takes Crestor  20 mg daily.    She is morbid obese with a BMI of 39 and HTN and Hyperlipidemia.  Complaining of foul  urine. Requesting urine to be checked today.  Hypertension This is a chronic problem. The current episode started more than 1 year ago. The problem has been resolved since onset. The problem is controlled. Associated symptoms include anxiety, malaise/fatigue, peripheral edema and shortness of breath. Pertinent negatives include no blurred vision. Risk factors for coronary artery disease include dyslipidemia, diabetes mellitus, obesity and sedentary lifestyle. The current treatment provides moderate improvement. Identifiable causes of hypertension include a thyroid  problem.  Gastroesophageal Reflux She complains of belching, heartburn and a hoarse voice. This is a chronic problem. The current episode started more than 1 year ago. The problem occurs occasionally. Associated symptoms  include fatigue. Risk factors include obesity. She has tried a PPI for the symptoms. The treatment provided moderate relief.  Thyroid  Problem Presents for follow-up visit. Symptoms include anxiety, constipation, depressed mood, dry skin, fatigue and hoarse voice. The symptoms have been stable.  Diabetes She presents for her follow-up diabetic visit. She has type 2 diabetes mellitus. Hypoglycemia symptoms include nervousness/anxiousness. Associated symptoms include fatigue. Pertinent negatives for diabetes include no blurred vision and no foot paresthesias. Symptoms are stable. Risk factors for coronary artery disease include dyslipidemia, diabetes mellitus, hypertension, sedentary lifestyle and post-menopausal. She is following a generally healthy diet. Her overall blood glucose range is 140-180 mg/dl. Eye exam is current.  Hyperlipidemia This is a chronic problem. The current episode started more than 1 year ago. The problem is uncontrolled. Recent lipid tests were reviewed and are high. Exacerbating diseases include obesity. Associated symptoms include shortness of breath. Current antihyperlipidemic treatment includes statins. The current treatment provides moderate improvement of lipids. Risk factors for coronary artery disease include diabetes mellitus, dyslipidemia, a sedentary lifestyle, hypertension and post-menopausal.  Depression        This is a chronic problem.  The current episode started more than 1 year ago.   The problem occurs intermittently.  Associated symptoms include fatigue, restlessness and sad.  Associated symptoms include no helplessness and no hopelessness.  Past treatments include SSRIs - Selective serotonin reuptake inhibitors.  Past medical history includes thyroid  problem and  anxiety.   Constipation This is a chronic problem. The current episode started more than 1 year ago. Her stool frequency is 1 time per week or less. Risk factors include obesity. She has tried fiber for  the symptoms. The treatment provided mild relief.  Anxiety Presents for follow-up visit. Symptoms include depressed mood, excessive worry, irritability, nervous/anxious behavior, restlessness and shortness of breath. Symptoms occur occasionally. The severity of symptoms is moderate.        Review of Systems  Constitutional:  Positive for fatigue, irritability and malaise/fatigue.  HENT:  Positive for hoarse voice.   Eyes:  Negative for blurred vision.  Respiratory:  Positive for shortness of breath.   Gastrointestinal:  Positive for constipation and heartburn.  Psychiatric/Behavioral:  The patient is nervous/anxious.   All other systems reviewed and are negative.  Family History  Problem Relation Age of Onset   Colon cancer Mother        at age 61   Heart disease Father    Heart attack Brother    Social History   Socioeconomic History   Marital status: Widowed    Spouse name: Not on file   Number of children: 1   Years of education: Not on file   Highest education level: Not on file  Occupational History   Occupation: disability    Employer: [employer]  Tobacco Use   Smoking status: Every Day    Current packs/day: 1.00    Average packs/day: 1 pack/day for 39.0 years (39.0 ttl pk-yrs)    Types: Cigarettes   Smokeless tobacco: Never  Vaping Use   Vaping status: Never Used  Substance and Sexual Activity   Alcohol  use: No   Drug use: No   Sexual activity: Not on file  Other Topics Concern   Not on file  Social History Narrative   Lives alone(widowed) son stays at times    Social Drivers of Health   Financial Resource Strain: Low Risk  (12/26/2023)   Overall Financial Resource Strain (CARDIA)    Difficulty of Paying Living Expenses: Not hard at all  Food Insecurity: No Food Insecurity (12/26/2023)   Hunger Vital Sign    Worried About Running Out of Food in the Last Year: Never true    Ran Out of Food in the Last Year: Never true  Transportation Needs: No  Transportation Needs (12/26/2023)   PRAPARE - Administrator, Civil Service (Medical): No    Lack of Transportation (Non-Medical): No  Physical Activity: Insufficiently Active (12/26/2023)   Exercise Vital Sign    Days of Exercise per Week: 4 days    Minutes of Exercise per Session: 20 min  Stress: No Stress Concern Present (12/26/2023)   [employer] of Occupational Health - Occupational Stress Questionnaire    Feeling of Stress : Not at all  Social Connections: Socially Isolated (12/26/2023)   Social Connection and Isolation Panel [NHANES]    Frequency of Communication with Friends and Family: Three times a week    Frequency of Social Gatherings with Friends and Family: Three times a week    Attends Religious Services: Never    Active Member of Clubs or Organizations: No    Attends Banker Meetings: Never    Marital Status: Divorced        Objective:   Physical Exam Vitals reviewed.  Constitutional:      General: She is not in acute distress.    Appearance: She is well-developed. She is obese.  HENT:     Head: Normocephalic and atraumatic.     Right Ear: Tympanic membrane normal.     Left Ear: Tympanic membrane normal.  Eyes:     Pupils: Pupils are equal, round, and reactive to light.  Neck:     Thyroid : No thyromegaly.  Cardiovascular:     Rate and Rhythm: Normal rate and regular rhythm.     Heart sounds: Normal heart sounds. No murmur heard. Pulmonary:     Effort: Pulmonary effort is normal. No respiratory distress.     Breath sounds: Wheezing present.  Abdominal:     General: Bowel sounds are normal. There is no distension.     Palpations: Abdomen is soft.     Tenderness: There is no abdominal tenderness.  Musculoskeletal:        General: No tenderness. Normal range of motion.     Cervical back: Normal range of motion and neck supple.     Right lower leg: Edema (trace) present.     Left lower leg: Edema (trace) present.  Skin:     General: Skin is warm and dry.  Neurological:     Mental Status: She is alert and oriented to person, place, and time.     Cranial Nerves: No cranial nerve deficit.     Deep Tendon Reflexes: Reflexes are normal and symmetric.  Psychiatric:        Behavior: Behavior normal.        Thought Content: Thought content normal.        Judgment: Judgment normal.    Diabetic Foot Exam - Simple   Simple Foot Form Diabetic Foot exam was performed with the following findings: Yes 01/21/2024 11:51 AM  Visual Inspection No deformities, no ulcerations, no other skin breakdown bilaterally: Yes Sensation Testing Intact to touch and monofilament testing bilaterally: Yes Pulse Check Posterior Tibialis and Dorsalis pulse intact bilaterally: Yes Comments Skin dry and cracked        BP (!) 104/51   Pulse 66   Temp 98 F (36.7 C)   Ht 5\' 2"  (1.575 m)   Wt 228 lb (103.4 kg)   BMI 41.70 kg/m      Assessment & Plan:  Cyndie H Tamblyn comes in today with chief complaint of Medical Management of Chronic Issues   Diagnosis and orders addressed:  1. Anxiety - CMP14+EGFR - busPIRone  (BUSPAR ) 7.5 MG tablet; Take 1 tablet (7.5 mg total) by mouth 3 (three) times daily as needed.  Dispense: 270 tablet; Refill: 2 - citalopram  (CELEXA ) 40 MG tablet; TAKE ONE (1) TABLET EACH DAY  Dispense: 90 tablet; Refill: 1 - clonazePAM  (KLONOPIN ) 1 MG tablet; TAKE 1 TABLET DAILY AS NEEDED FOR ANXIETY  Dispense: 30 tablet; Refill: 2  2. Grief - CMP14+EGFR - busPIRone  (BUSPAR ) 7.5 MG tablet; Take 1 tablet (7.5 mg total) by mouth 3 (three) times daily as needed.  Dispense: 270 tablet; Refill: 2  3. Benzodiazepine dependence (HCC) - CMP14+EGFR - citalopram  (CELEXA ) 40 MG tablet; TAKE ONE (1) TABLET EACH DAY  Dispense: 90 tablet; Refill: 1 - clonazePAM  (KLONOPIN ) 1 MG tablet; TAKE 1 TABLET DAILY AS NEEDED FOR ANXIETY  Dispense: 30 tablet; Refill: 2 - ToxASSURE Select 13 (MW), Urine  4. Controlled substance agreement  signed - CMP14+EGFR - clonazePAM  (KLONOPIN ) 1 MG tablet; TAKE 1 TABLET DAILY AS NEEDED FOR ANXIETY  Dispense: 30 tablet; Refill: 2 - ToxASSURE Select 13 (MW), Urine  5. Chronic obstructive pulmonary disease, unspecified COPD type (HCC) - CMP14+EGFR - fluticasone   furoate-vilanterol (BREO ELLIPTA ) 200-25 MCG/ACT AEPB; Inhale 1 puff into the lungs daily.  Dispense: 60 each; Refill: 2 - nicotine (NICODERM CQ - DOSED IN MG/24 HOURS) 21 mg/24hr patch; Place 1 patch (21 mg total) onto the skin daily.  Dispense: 28 patch; Refill: 2  6. Essential hypertension Low today, hold lisinopril  - CMP14+EGFR - lisinopril  (ZESTRIL ) 40 MG tablet; Take 1 tablet (40 mg total) by mouth daily.  Dispense: 90 tablet; Refill: 0  7. Hoarseness of voice - CMP14+EGFR - lisinopril  (ZESTRIL ) 40 MG tablet; Take 1 tablet (40 mg total) by mouth daily.  Dispense: 90 tablet; Refill: 0  8. Hypokalemia - CMP14+EGFR - potassium chloride  SA (KLOR-CON  M) 20 MEQ tablet; Take 1 tablet (20 mEq total) by mouth 2 (two) times daily.  Dispense: 200 tablet; Refill: 0  9. Type 2 diabetes mellitus with other specified complication, without long-term current use of insulin (HCC) (Primary) - Bayer DCA Hb A1c Waived - CMP14+EGFR - Microalbumin / creatinine urine ratio - Semaglutide , 1 MG/DOSE, 4 MG/3ML SOPN; Inject 1 mg into the skin once a week.  Dispense: 3 mL; Refill: 2 - Continuous Glucose Sensor (FREESTYLE LIBRE 3 PLUS SENSOR) MISC; Change sensor every 15 days.  Dispense: 6 each; Refill: 2  10. Hypothyroidism, unspecified type - CMP14+EGFR  11. Hyperlipidemia, unspecified hyperlipidemia type - CMP14+EGFR - Lipid panel  12. Moderate episode of recurrent major depressive disorder (HCC) - CMP14+EGFR  13. TOBACCO ABUSE - CMP14+EGFR - nicotine (NICODERM CQ - DOSED IN MG/24 HOURS) 21 mg/24hr patch; Place 1 patch (21 mg total) onto the skin daily.  Dispense: 28 patch; Refill: 2  14. Gastroesophageal reflux disease, unspecified  whether esophagitis present - CMP14+EGFR  15. Morbid obesity (HCC)  - CMP14+EGFR  16. Vitamin D  deficiency  - CMP14+EGFR  17. Foul smelling urine - CMP14+EGFR - Urinalysis, Complete - Urine Culture  18. Encounter for smoking cessation counseling - nicotine (NICODERM CQ - DOSED IN MG/24 HOURS) 21 mg/24hr patch; Place 1 patch (21 mg total) onto the skin daily.  Dispense: 28 patch; Refill: 2  19. Constipation, unspecified constipation type - linaclotide  (LINZESS ) 72 MCG capsule; Take 1 capsule (72 mcg total) by mouth daily before breakfast.  Dispense: 90 capsule; Refill: 1    Labs pending Will hold lisinopril  today. Do not give if BP<110/60 Will increase Ozempic  to 1 mg from 0.5 mg Nicotine patches ordered today Start Linzess  72 mcg Continue current medications  Health Maintenance reviewed Diet and exercise encouraged  Follow up plan: 1 month for Diabetes, hypotension, smoking cessation, and constipation.    Tommas Fragmin, FNP

## 2024-01-21 NOTE — Patient Instructions (Signed)

## 2024-01-22 ENCOUNTER — Telehealth: Payer: Self-pay

## 2024-01-22 ENCOUNTER — Other Ambulatory Visit: Payer: Self-pay | Admitting: Family

## 2024-01-22 ENCOUNTER — Other Ambulatory Visit (HOSPITAL_COMMUNITY): Payer: Self-pay

## 2024-01-22 DIAGNOSIS — E039 Hypothyroidism, unspecified: Secondary | ICD-10-CM

## 2024-01-22 LAB — CMP14+EGFR
ALT: 11 IU/L (ref 0–32)
AST: 12 IU/L (ref 0–40)
Albumin: 3.9 g/dL (ref 3.9–4.9)
Alkaline Phosphatase: 72 IU/L (ref 44–121)
BUN/Creatinine Ratio: 13 (ref 12–28)
BUN: 14 mg/dL (ref 8–27)
Bilirubin Total: 0.2 mg/dL (ref 0.0–1.2)
CO2: 22 mmol/L (ref 20–29)
Calcium: 9 mg/dL (ref 8.7–10.3)
Chloride: 96 mmol/L (ref 96–106)
Creatinine, Ser: 1.11 mg/dL — ABNORMAL HIGH (ref 0.57–1.00)
Globulin, Total: 2.2 g/dL (ref 1.5–4.5)
Glucose: 142 mg/dL — ABNORMAL HIGH (ref 70–99)
Potassium: 4.5 mmol/L (ref 3.5–5.2)
Sodium: 132 mmol/L — ABNORMAL LOW (ref 134–144)
Total Protein: 6.1 g/dL (ref 6.0–8.5)
eGFR: 56 mL/min/{1.73_m2} — ABNORMAL LOW (ref >59)

## 2024-01-22 LAB — LIPID PANEL
Chol/HDL Ratio: 3.4 ratio (ref 0.0–4.4)
Cholesterol, Total: 184 mg/dL (ref 100–199)
HDL: 54 mg/dL (ref >39)
LDL Chol Calc (NIH): 107 mg/dL — ABNORMAL HIGH (ref 0–99)
Triglycerides: 130 mg/dL (ref 0–149)
VLDL Cholesterol Cal: 23 mg/dL (ref 5–40)

## 2024-01-22 LAB — MICROALBUMIN / CREATININE URINE RATIO
Creatinine, Urine: 114.4 mg/dL
Microalb/Creat Ratio: 4 mg/g{creat} (ref 0–29)
Microalbumin, Urine: 4.8 ug/mL

## 2024-01-22 NOTE — Telephone Encounter (Signed)
 Pharmacy Patient Advocate Encounter   Received notification from Onbase that prior authorization for FreeStyle Libre 3 Plus Sensor is required/requested.   Insurance verification completed.   The patient is insured through [place] .   Per test claim: PA required; PA submitted to above mentioned insurance via CoverMyMeds Key/confirmation #/EOC [id number] Status is pending

## 2024-01-23 ENCOUNTER — Ambulatory Visit: Payer: Self-pay | Admitting: Family

## 2024-01-23 ENCOUNTER — Other Ambulatory Visit: Payer: Self-pay | Admitting: Family

## 2024-01-23 DIAGNOSIS — L84 Corns and callosities: Secondary | ICD-10-CM | POA: Diagnosis not present

## 2024-01-23 DIAGNOSIS — B351 Tinea unguium: Secondary | ICD-10-CM | POA: Diagnosis not present

## 2024-01-23 DIAGNOSIS — M79676 Pain in unspecified toe(s): Secondary | ICD-10-CM | POA: Diagnosis not present

## 2024-01-23 DIAGNOSIS — E1142 Type 2 diabetes mellitus with diabetic polyneuropathy: Secondary | ICD-10-CM | POA: Diagnosis not present

## 2024-01-23 LAB — TOXASSURE SELECT 13 (MW), URINE

## 2024-01-23 MED ORDER — CEPHALEXIN 500 MG PO CAPS: 500.0000 mg | ORAL_CAPSULE | Freq: Two times a day (BID) | ORAL | 0 refills | Status: DC

## 2024-01-24 NOTE — Telephone Encounter (Signed)
 Pharmacy Patient Advocate Encounter  Received notification from [facility] that Prior Authorization for FreeStyle Libre 3 Plus Sensor has been DENIED.  See denial reason below. No denial letter attached in CMM. Will attach denial letter to Media tab once received.   PA #/Case ID/Reference #: [id number]

## 2024-01-25 LAB — URINE CULTURE

## 2024-01-28 ENCOUNTER — Ambulatory Visit: Admitting: *Deleted

## 2024-01-28 DIAGNOSIS — F17210 Nicotine dependence, cigarettes, uncomplicated: Secondary | ICD-10-CM | POA: Diagnosis not present

## 2024-01-28 NOTE — Progress Notes (Signed)
 Virtual Visit via Telephone Note  I connected with Elizabeth Brewer on 01/28/24 at  2:00 PM EDT by telephone and verified that I am speaking with the correct person using two identifiers.  Location: Patient: Elizabeth Brewer Provider: Alyse Bach, RN   I discussed the limitations, risks, security and privacy concerns of performing an evaluation and management service by telephone and the availability of in person appointments. I also discussed with the patient that there may be a patient responsible charge related to this service. The patient expressed understanding and agreed to proceed.   Shared Decision Making Visit Lung Cancer Screening Program [phone])   Eligibility: Age 63 y.o. Pack Years Smoking History Calculation 46 (# packs/per year x # years smoked) Recent History of coughing up blood  no Unexplained weight loss? no ( >Than 15 pounds within the last 6 months ) Prior History Lung / other cancer no (Diagnosis within the last 5 years already requiring surveillance chest CT Scans). Smoking Status Current Smoker Former Smokers: Years since quit: n/a  Quit Date: n/a  Visit Components: Discussion included one or more decision making aids. yes Discussion included risk/benefits of screening. yes Discussion included potential follow up diagnostic testing for abnormal scans. yes Discussion included meaning and risk of over diagnosis. yes Discussion included meaning and risk of False Positives. yes Discussion included meaning of total radiation exposure. yes  Counseling Included: Importance of adherence to annual lung cancer LDCT screening. yes Impact of comorbidities on ability to participate in the program. yes Ability and willingness to under diagnostic treatment. yes  Smoking Cessation Counseling: Current Smokers:  Discussed importance of smoking cessation. yes Information about tobacco cessation classes and interventions provided to patient. yes Patient provided  with "ticket" for LDCT Scan. no Symptomatic Patient. no  Counseling(Intermediate counseling: > three minutes) 99406 Diagnosis Code: Tobacco Use Z72.0 Asymptomatic Patient yes  Counseling (Intermediate counseling: > three minutes counseling) [id number] Former Smokers:  Discussed the importance of maintaining cigarette abstinence. yes Diagnosis Code: Personal History of Nicotine Dependence. G95.621 Information about tobacco cessation classes and interventions provided to patient. Yes Patient provided with "ticket" for LDCT Scan. no Written Order for Lung Cancer Screening with LDCT placed in Epic. Yes (CT Chest Lung Cancer Screening Low Dose W/O CM) [id number] Z12.2-Screening of respiratory organs Z87.891-Personal history of nicotine dependence   Alyse Bach, RN

## 2024-01-28 NOTE — Patient Instructions (Signed)

## 2024-01-30 ENCOUNTER — Telehealth: Payer: Self-pay | Admitting: Family Medicine

## 2024-01-30 ENCOUNTER — Other Ambulatory Visit (HOSPITAL_COMMUNITY): Payer: Self-pay | Admitting: Family

## 2024-01-30 ENCOUNTER — Ambulatory Visit (HOSPITAL_COMMUNITY)
Admission: RE | Admit: 2024-01-30 | Discharge: 2024-01-30 | Disposition: A | Discharge status: Home / Self Care | Source: Ambulatory Visit | Attending: Family | Admitting: Family

## 2024-01-30 DIAGNOSIS — Z87891 Personal history of nicotine dependence: Secondary | ICD-10-CM | POA: Insufficient documentation

## 2024-01-30 DIAGNOSIS — Z122 Encounter for screening for malignant neoplasm of respiratory organs: Secondary | ICD-10-CM | POA: Diagnosis not present

## 2024-01-30 DIAGNOSIS — F17210 Nicotine dependence, cigarettes, uncomplicated: Secondary | ICD-10-CM | POA: Diagnosis not present

## 2024-01-30 DIAGNOSIS — Z1231 Encounter for screening mammogram for malignant neoplasm of breast: Secondary | ICD-10-CM

## 2024-01-30 NOTE — Telephone Encounter (Signed)
 Copied from CRM [id number]. Topic: General - Other >> Jan 30, 2024  1:28 PM [place] wrote: Reason for CRM: pt called in because the insurance denied she said " the thing that goes on your arm for diabetes" so the only way she can get it approved is for the clinic to appeal it if possible .

## 2024-01-30 NOTE — Telephone Encounter (Signed)
 Can we check on PA on libre.

## 2024-02-04 NOTE — Telephone Encounter (Signed)
 The lowest her sugars have got was 120

## 2024-02-04 NOTE — Telephone Encounter (Signed)
 Can we call and see if she has low glucose at times?

## 2024-02-07 ENCOUNTER — Telehealth: Payer: Self-pay | Admitting: Pharmacist

## 2024-02-07 NOTE — Telephone Encounter (Signed)
   This patient is appearing on a report for being at risk of failing the adherence measure for diabetes medications this calendar year.   Medication: Ozempic  Last fill date: 01/21/24 for 84 day supply  Insurance report was not up to date. No action needed at this time.    Brookelynn Hamor Dattero Kendle Turbin, PharmD, BCACP, CPP Clinical Pharmacist, [facility]

## 2024-02-07 NOTE — Telephone Encounter (Signed)
 Patient notified and verbalized understanding. Does not want it at this time

## 2024-02-07 NOTE — Telephone Encounter (Signed)
 Not covered because patient is not on insulin or having hypoglycemia  Would be $75 cash for 1 month of [place]

## 2024-02-10 ENCOUNTER — Ambulatory Visit

## 2024-02-10 NOTE — Progress Notes (Signed)
 Unable to obtain clear images. Pt will schedule appt with eye dr

## 2024-02-14 ENCOUNTER — Other Ambulatory Visit: Payer: Self-pay | Admitting: Acute Care

## 2024-02-14 DIAGNOSIS — Z122 Encounter for screening for malignant neoplasm of respiratory organs: Secondary | ICD-10-CM

## 2024-02-14 DIAGNOSIS — F17210 Nicotine dependence, cigarettes, uncomplicated: Secondary | ICD-10-CM

## 2024-02-14 DIAGNOSIS — Z87891 Personal history of nicotine dependence: Secondary | ICD-10-CM

## 2024-02-24 ENCOUNTER — Ambulatory Visit (INDEPENDENT_AMBULATORY_CARE_PROVIDER_SITE_OTHER): Admitting: Family

## 2024-02-24 ENCOUNTER — Encounter: Payer: Self-pay | Admitting: Family

## 2024-02-24 VITALS — BP 139/69 | HR 76 | Temp 97.6°F | Ht 62.0 in | Wt 226.0 lb

## 2024-02-24 DIAGNOSIS — E039 Hypothyroidism, unspecified: Secondary | ICD-10-CM

## 2024-02-24 DIAGNOSIS — F419 Anxiety disorder, unspecified: Secondary | ICD-10-CM

## 2024-02-24 DIAGNOSIS — F17200 Nicotine dependence, unspecified, uncomplicated: Secondary | ICD-10-CM | POA: Diagnosis not present

## 2024-02-24 DIAGNOSIS — E1169 Type 2 diabetes mellitus with other specified complication: Secondary | ICD-10-CM

## 2024-02-24 DIAGNOSIS — K219 Gastro-esophageal reflux disease without esophagitis: Secondary | ICD-10-CM | POA: Diagnosis not present

## 2024-02-24 DIAGNOSIS — Z6841 Body Mass Index (BMI) 40.0 and over, adult: Secondary | ICD-10-CM

## 2024-02-24 DIAGNOSIS — K5900 Constipation, unspecified: Secondary | ICD-10-CM | POA: Diagnosis not present

## 2024-02-24 DIAGNOSIS — E785 Hyperlipidemia, unspecified: Secondary | ICD-10-CM | POA: Diagnosis not present

## 2024-02-24 DIAGNOSIS — I10 Essential (primary) hypertension: Secondary | ICD-10-CM

## 2024-02-24 DIAGNOSIS — Z716 Tobacco abuse counseling: Secondary | ICD-10-CM

## 2024-02-24 DIAGNOSIS — F331 Major depressive disorder, recurrent, moderate: Secondary | ICD-10-CM

## 2024-02-24 DIAGNOSIS — J449 Chronic obstructive pulmonary disease, unspecified: Secondary | ICD-10-CM

## 2024-02-24 MED ORDER — SEMAGLUTIDE (2 MG/DOSE) 8 MG/3ML ~~LOC~~ SOPN: 2.0000 mg | PEN_INJECTOR | SUBCUTANEOUS | 3 refills | Status: DC

## 2024-02-24 MED ORDER — FREESTYLE LANCETS MISC
12 refills | Status: DC
Start: 2024-02-24 — End: 2024-02-24

## 2024-02-24 MED ORDER — LINACLOTIDE 145 MCG PO CAPS: 145.0000 ug | ORAL_CAPSULE | Freq: Every day | ORAL | 2 refills | Status: AC

## 2024-02-24 MED ORDER — NICOTINE 21 MG/24HR TD PT24: 21.0000 mg | MEDICATED_PATCH | Freq: Every day | TRANSDERMAL | 2 refills | Status: DC

## 2024-02-24 MED ORDER — FREESTYLE LANCETS MISC: 12 refills | Status: DC

## 2024-02-24 MED ORDER — LINACLOTIDE 145 MCG PO CAPS: 145.0000 ug | ORAL_CAPSULE | Freq: Every day | ORAL | 2 refills | Status: DC

## 2024-02-24 NOTE — Patient Instructions (Signed)

## 2024-02-24 NOTE — Progress Notes (Signed)
 Subjective:    Patient ID: Elizabeth Brewer, female    DOB: 1960-10-09, 63 y.o.   MRN: [id number]  Chief Complaint  Patient presents with   Follow-up    1 month for Diabetes, hypotension, smoking cessation, and constipation   PT presents to the office today for chronic follow up.  Her last visit we held her lisinopril  because of hypotension. Her BP is at goal today. We added Linzess  72 mcg that has helped, but still on going 2-3 times a week. We ordered nicotine  patches, but she states she never got the rx of this.    She was started on Ozempic  1 mg. Her starting weight was 222 lb. She has lost 2 lb since our last visit. (She had gained 6 lbs)     02/24/2024   11:29 AM 01/21/2024   11:36 AM 12/26/2023   11:41 AM  Last 3 Weights  Weight (lbs) 226 lb 228 lb 222 lb  Weight (kg) 102.513 kg 103.42 kg 100.699 kg     PT is followed by a neurologists every 3 months for chronic neck pain,seizure, and migraines. These are stable at this time.    She is followed by [facility] every 3 months.     She states her brother died in November 20, 2023 and her husband shot himself. She has had a great deal of anxiety with this.    She has COPD and continues to smoke 1/2 pack a day. Has intermittent SOB and cough. Using Breo daily. Wants to start using nicotine  patches.    She saw ENT for hoarse voice. They cleared her.   Has aortic atherosclerosis and takes Crestor  20 mg daily.    She is morbid obese with a BMI of 41 and HTN and Hyperlipidemia. Hypertension This is a chronic problem. The current episode started more than 1 year ago. The problem has been resolved since onset. The problem is controlled. Associated symptoms include anxiety, malaise/fatigue, peripheral edema (some times) and shortness of breath. Pertinent negatives include no blurred vision. Risk factors for coronary artery disease include dyslipidemia, diabetes mellitus, obesity and sedentary lifestyle. Past treatments include lifestyle  changes. The current treatment provides moderate improvement. Identifiable causes of hypertension include a thyroid  problem.  Gastroesophageal Reflux She complains of belching, heartburn and a hoarse voice. This is a chronic problem. The current episode started more than 1 year ago. The problem occurs occasionally. The symptoms are aggravated by certain foods. Associated symptoms include fatigue. Risk factors include obesity. She has tried a PPI for the symptoms. The treatment provided moderate relief.  Thyroid  Problem Presents for follow-up visit. Symptoms include anxiety, constipation, depressed mood, dry skin, fatigue and hoarse voice. The symptoms have been stable.  Diabetes She presents for her follow-up diabetic visit. She has type 2 diabetes mellitus. Hypoglycemia symptoms include nervousness/anxiousness. Associated symptoms include fatigue. Pertinent negatives for diabetes include no blurred vision and no foot paresthesias. Symptoms are stable. Risk factors for coronary artery disease include dyslipidemia, diabetes mellitus, hypertension, sedentary lifestyle and post-menopausal. She is following a generally unhealthy diet. Her overall blood glucose range is 140-180 mg/dl. Eye exam is current.  Hyperlipidemia This is a chronic problem. The current episode started more than 1 year ago. The problem is uncontrolled. Recent lipid tests were reviewed and are high. Exacerbating diseases include obesity. Associated symptoms include shortness of breath. Current antihyperlipidemic treatment includes statins. The current treatment provides moderate improvement of lipids. Risk factors for coronary artery disease include diabetes mellitus, dyslipidemia, a sedentary  lifestyle, hypertension and post-menopausal.  Depression        This is a chronic problem.  The current episode started more than 1 year ago.   The problem occurs intermittently.  Associated symptoms include fatigue, restlessness and sad.  Associated  symptoms include no helplessness and no hopelessness.  Past treatments include SSRIs - Selective serotonin reuptake inhibitors.  Past medical history includes thyroid  problem and anxiety.   Constipation This is a chronic problem. The current episode started more than 1 year ago. Her stool frequency is 2 to 3 times per week. Risk factors include obesity. She has tried fiber (linzess ) for the symptoms. The treatment provided mild relief.  Anxiety Presents for follow-up visit. Symptoms include depressed mood, excessive worry, irritability, nervous/anxious behavior, restlessness and shortness of breath. Symptoms occur occasionally. The severity of symptoms is moderate.        Review of Systems  Constitutional:  Positive for fatigue, irritability and malaise/fatigue.  HENT:  Positive for hoarse voice.   Eyes:  Negative for blurred vision.  Respiratory:  Positive for shortness of breath.   Gastrointestinal:  Positive for constipation and heartburn.  Psychiatric/Behavioral:  The patient is nervous/anxious.   All other systems reviewed and are negative.  Family History  Problem Relation Age of Onset   Colon cancer Mother        at age 13   Heart disease Father    Heart attack Brother    Social History   Socioeconomic History   Marital status: Widowed    Spouse name: Not on file   Number of children: 1   Years of education: Not on file   Highest education level: Not on file  Occupational History   Occupation: disability    Employer: [employer]  Tobacco Use   Smoking status: Every Day    Current packs/day: 1.00    Average packs/day: 1 pack/day for 46.5 years (46.5 ttl pk-yrs)    Types: Cigarettes    Start date: 1979   Smokeless tobacco: Never  Vaping Use   Vaping status: Never Used  Substance and Sexual Activity   Alcohol  use: No   Drug use: No   Sexual activity: Not on file  Other Topics Concern   Not on file  Social History Narrative   Lives alone(widowed) son stays at  times    Social Drivers of Health   Financial Resource Strain: Low Risk  (12/26/2023)   Overall Financial Resource Strain (CARDIA)    Difficulty of Paying Living Expenses: Not hard at all  Food Insecurity: No Food Insecurity (12/26/2023)   Hunger Vital Sign    Worried About Running Out of Food in the Last Year: Never true    Ran Out of Food in the Last Year: Never true  Transportation Needs: No Transportation Needs (12/26/2023)   PRAPARE - Administrator, Civil Service (Medical): No    Lack of Transportation (Non-Medical): No  Physical Activity: Insufficiently Active (12/26/2023)   Exercise Vital Sign    Days of Exercise per Week: 4 days    Minutes of Exercise per Session: 20 min  Stress: No Stress Concern Present (12/26/2023)   [employer] of Occupational Health - Occupational Stress Questionnaire    Feeling of Stress : Not at all  Social Connections: Socially Isolated (12/26/2023)   Social Connection and Isolation Panel    Frequency of Communication with Friends and Family: Three times a week    Frequency of Social Gatherings with Friends and Family:  Three times a week    Attends Religious Services: Never    Active Member of Clubs or Organizations: No    Attends Banker Meetings: Never    Marital Status: Divorced        Objective:   Physical Exam Vitals reviewed.  Constitutional:      General: She is not in acute distress.    Appearance: She is well-developed. She is obese.  HENT:     Head: Normocephalic and atraumatic.     Right Ear: Tympanic membrane normal.     Left Ear: Tympanic membrane normal.   Eyes:     Pupils: Pupils are equal, round, and reactive to light.   Neck:     Thyroid : No thyromegaly.   Cardiovascular:     Rate and Rhythm: Normal rate and regular rhythm.     Heart sounds: Normal heart sounds. No murmur heard. Pulmonary:     Effort: Pulmonary effort is normal. No respiratory distress.     Breath sounds: Wheezing  present.  Abdominal:     General: Bowel sounds are normal. There is no distension.     Palpations: Abdomen is soft.     Tenderness: There is no abdominal tenderness.   Musculoskeletal:        General: No tenderness. Normal range of motion.     Cervical back: Normal range of motion and neck supple.     Right lower leg: Edema (trace) present.     Left lower leg: Edema (trace) present.   Skin:    General: Skin is warm and dry.   Neurological:     Mental Status: She is alert and oriented to person, place, and time.     Cranial Nerves: No cranial nerve deficit.     Deep Tendon Reflexes: Reflexes are normal and symmetric.   Psychiatric:        Behavior: Behavior normal.        Thought Content: Thought content normal.        Judgment: Judgment normal.       BP 139/69   Pulse 76   Temp 97.6 F (36.4 C) (Temporal)   Ht 5' 2 (1.575 m)   Wt 226 lb (102.5 kg)   BMI 41.34 kg/m      Assessment & Plan:  Elizabeth Brewer comes in today with chief complaint of Follow-up (1 month for Diabetes, hypotension, smoking cessation, and constipation)   Diagnosis and orders addressed:  1. Type 2 diabetes mellitus with other specified complication, without long-term current use of insulin (HCC) (Primary) - Lancets (FREESTYLE) lancets; Use as instructed  Dispense: 100 each; Refill: 12 - Semaglutide , 2 MG/DOSE, 8 MG/3ML SOPN; Inject 2 mg as directed once a week.  Dispense: 3 mL; Refill: 3  2. Constipation, unspecified constipation type - linaclotide  (LINZESS ) 145 MCG CAPS capsule; Take 1 capsule (145 mcg total) by mouth daily before breakfast.  Dispense: 90 capsule; Refill: 2  3. Morbid obesity (HCC)  4. Anxiety  5. Gastroesophageal reflux disease, unspecified whether esophagitis present  6. TOBACCO ABUSE  - nicotine  (NICODERM CQ  - DOSED IN MG/24 HOURS) 21 mg/24hr patch; Place 1 patch (21 mg total) onto the skin daily.  Dispense: 28 patch; Refill: 2  7. Essential hypertension  8.  Hypothyroidism, unspecified type  9. Hyperlipidemia, unspecified hyperlipidemia type  10. Moderate episode of recurrent major depressive disorder (HCC)  11. Chronic obstructive pulmonary disease, unspecified COPD type (HCC) - nicotine  (NICODERM CQ  - DOSED IN MG/24 HOURS) 21  mg/24hr patch; Place 1 patch (21 mg total) onto the skin daily.  Dispense: 28 patch; Refill: 2  12. Encounter for smoking cessation counseling - nicotine  (NICODERM CQ  - DOSED IN MG/24 HOURS) 21 mg/24hr patch; Place 1 patch (21 mg total) onto the skin daily.  Dispense: 28 patch; Refill: 2   Labs reviewed from last visit  Will increase Ozempic  to 2 mg from 1 mg Nicotine  patches ordered today Increased Linzess  to 145 mg from 72 mcg Continue current medications  Health Maintenance reviewed Diet and exercise encouraged  Follow up plan: 2 months for chronic follow up   Bari Learn, FNP

## 2024-02-26 ENCOUNTER — Encounter (INDEPENDENT_AMBULATORY_CARE_PROVIDER_SITE_OTHER): Payer: Self-pay | Admitting: Otolaryngology

## 2024-02-26 ENCOUNTER — Ambulatory Visit (INDEPENDENT_AMBULATORY_CARE_PROVIDER_SITE_OTHER): Payer: Medicare Other | Admitting: Otolaryngology

## 2024-02-26 ENCOUNTER — Telehealth: Payer: Self-pay | Admitting: *Deleted

## 2024-02-26 VITALS — BP 151/76 | HR 68

## 2024-02-26 DIAGNOSIS — R0982 Postnasal drip: Secondary | ICD-10-CM

## 2024-02-26 DIAGNOSIS — F17200 Nicotine dependence, unspecified, uncomplicated: Secondary | ICD-10-CM

## 2024-02-26 DIAGNOSIS — K219 Gastro-esophageal reflux disease without esophagitis: Secondary | ICD-10-CM | POA: Diagnosis not present

## 2024-02-26 DIAGNOSIS — J3089 Other allergic rhinitis: Secondary | ICD-10-CM

## 2024-02-26 DIAGNOSIS — R490 Dysphonia: Secondary | ICD-10-CM

## 2024-02-26 DIAGNOSIS — J383 Other diseases of vocal cords: Secondary | ICD-10-CM

## 2024-02-26 DIAGNOSIS — J342 Deviated nasal septum: Secondary | ICD-10-CM

## 2024-02-26 DIAGNOSIS — R0981 Nasal congestion: Secondary | ICD-10-CM | POA: Diagnosis not present

## 2024-02-26 DIAGNOSIS — J343 Hypertrophy of nasal turbinates: Secondary | ICD-10-CM | POA: Diagnosis not present

## 2024-02-26 DIAGNOSIS — J449 Chronic obstructive pulmonary disease, unspecified: Secondary | ICD-10-CM

## 2024-02-26 DIAGNOSIS — Z716 Tobacco abuse counseling: Secondary | ICD-10-CM

## 2024-02-26 DIAGNOSIS — K117 Disturbances of salivary secretion: Secondary | ICD-10-CM

## 2024-02-26 MED ORDER — SALINE SPRAY 0.65 % NA SOLN: 1.0000 | NASAL | 5 refills | Status: AC | PRN

## 2024-02-26 MED ORDER — FLUTICASONE PROPIONATE 50 MCG/ACT NA SUSP: 2.0000 | Freq: Every day | NASAL | 6 refills | Status: AC

## 2024-02-26 NOTE — Telephone Encounter (Signed)
 Please advise  Copied from CRM [id number]. Topic: Clinical - Prescription Issue >> Feb 26, 2024 11:38 AM Elizabeth Brewer wrote: Reason for CRM: Pt states insurance will not cover nicotine  (NICODERM CQ  - DOSED IN MG/24 HOURS) 21 mg/24hr patch. Pt states [facility] cancelled the order. Pt willing to pay out of pocket for patches and requesting for rx to be sent to to [facility] again.
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
## 2024-02-27 ENCOUNTER — Telehealth: Payer: Self-pay | Admitting: *Deleted

## 2024-02-27 DIAGNOSIS — E1169 Type 2 diabetes mellitus with other specified complication: Secondary | ICD-10-CM

## 2024-02-27 MED ORDER — FREESTYLE LANCETS MISC
3 refills | Status: AC
Start: 2024-02-27 — End: ?

## 2024-02-27 MED ORDER — NICOTINE 21 MG/24HR TD PT24: 21.0000 mg | MEDICATED_PATCH | Freq: Every day | TRANSDERMAL | 2 refills | Status: AC

## 2024-02-27 NOTE — Telephone Encounter (Signed)
 Fax from [facility]  RE: lancets needing specific directions and 90 day supply resent

## 2024-02-27 NOTE — Telephone Encounter (Signed)
 Prescription sent to pharmacy.

## 2024-02-27 NOTE — Addendum Note (Signed)
 Addended by: LAVELL LYE A on: 02/27/2024 01:26 PM   Modules accepted: Orders

## 2024-03-19 DIAGNOSIS — E538 Deficiency of other specified B group vitamins: Secondary | ICD-10-CM | POA: Diagnosis not present

## 2024-03-19 DIAGNOSIS — G43109 Migraine with aura, not intractable, without status migrainosus: Secondary | ICD-10-CM | POA: Diagnosis not present

## 2024-03-19 DIAGNOSIS — M542 Cervicalgia: Secondary | ICD-10-CM | POA: Diagnosis not present

## 2024-03-23 ENCOUNTER — Other Ambulatory Visit: Payer: Self-pay | Admitting: Family

## 2024-03-23 DIAGNOSIS — I10 Essential (primary) hypertension: Secondary | ICD-10-CM

## 2024-03-23 DIAGNOSIS — R490 Dysphonia: Secondary | ICD-10-CM

## 2024-03-23 DIAGNOSIS — J449 Chronic obstructive pulmonary disease, unspecified: Secondary | ICD-10-CM

## 2024-04-13 NOTE — Progress Notes (Signed)
 Tobacco counseling provided (Intermediate counseling: 3 1/2 minutes) [id number]

## 2024-04-18 ENCOUNTER — Other Ambulatory Visit: Payer: Self-pay | Admitting: Gastroenterology

## 2024-04-18 ENCOUNTER — Other Ambulatory Visit: Payer: Self-pay | Admitting: Family

## 2024-04-22 ENCOUNTER — Other Ambulatory Visit: Payer: Self-pay | Admitting: Family

## 2024-04-22 DIAGNOSIS — E1169 Type 2 diabetes mellitus with other specified complication: Secondary | ICD-10-CM

## 2024-04-23 DIAGNOSIS — B351 Tinea unguium: Secondary | ICD-10-CM | POA: Diagnosis not present

## 2024-04-23 DIAGNOSIS — L84 Corns and callosities: Secondary | ICD-10-CM | POA: Diagnosis not present

## 2024-04-23 DIAGNOSIS — M79675 Pain in left toe(s): Secondary | ICD-10-CM | POA: Diagnosis not present

## 2024-04-23 DIAGNOSIS — E1142 Type 2 diabetes mellitus with diabetic polyneuropathy: Secondary | ICD-10-CM | POA: Diagnosis not present

## 2024-04-23 DIAGNOSIS — M79674 Pain in right toe(s): Secondary | ICD-10-CM | POA: Diagnosis not present

## 2024-04-28 ENCOUNTER — Other Ambulatory Visit: Payer: Self-pay | Admitting: Family

## 2024-04-28 DIAGNOSIS — E876 Hypokalemia: Secondary | ICD-10-CM

## 2024-04-28 DIAGNOSIS — E039 Hypothyroidism, unspecified: Secondary | ICD-10-CM

## 2024-04-30 ENCOUNTER — Encounter: Payer: Self-pay | Admitting: Family

## 2024-04-30 ENCOUNTER — Ambulatory Visit: Admitting: Family

## 2024-04-30 VITALS — BP 138/76 | HR 77 | Temp 97.7°F | Ht 62.0 in | Wt 222.6 lb

## 2024-04-30 DIAGNOSIS — K5900 Constipation, unspecified: Secondary | ICD-10-CM

## 2024-04-30 DIAGNOSIS — E785 Hyperlipidemia, unspecified: Secondary | ICD-10-CM | POA: Diagnosis not present

## 2024-04-30 DIAGNOSIS — E039 Hypothyroidism, unspecified: Secondary | ICD-10-CM | POA: Diagnosis not present

## 2024-04-30 DIAGNOSIS — K219 Gastro-esophageal reflux disease without esophagitis: Secondary | ICD-10-CM

## 2024-04-30 DIAGNOSIS — Z0000 Encounter for general adult medical examination without abnormal findings: Secondary | ICD-10-CM

## 2024-04-30 DIAGNOSIS — I700 Atherosclerosis of aorta: Secondary | ICD-10-CM | POA: Diagnosis not present

## 2024-04-30 DIAGNOSIS — Z0001 Encounter for general adult medical examination with abnormal findings: Secondary | ICD-10-CM

## 2024-04-30 DIAGNOSIS — J449 Chronic obstructive pulmonary disease, unspecified: Secondary | ICD-10-CM

## 2024-04-30 DIAGNOSIS — F331 Major depressive disorder, recurrent, moderate: Secondary | ICD-10-CM

## 2024-04-30 DIAGNOSIS — I10 Essential (primary) hypertension: Secondary | ICD-10-CM

## 2024-04-30 DIAGNOSIS — E559 Vitamin D deficiency, unspecified: Secondary | ICD-10-CM

## 2024-04-30 DIAGNOSIS — E1169 Type 2 diabetes mellitus with other specified complication: Secondary | ICD-10-CM | POA: Diagnosis not present

## 2024-04-30 DIAGNOSIS — F17200 Nicotine dependence, unspecified, uncomplicated: Secondary | ICD-10-CM | POA: Diagnosis not present

## 2024-04-30 LAB — BAYER DCA HB A1C WAIVED: HB A1C (BAYER DCA - WAIVED): 5.7 % — ABNORMAL HIGH (ref 4.8–5.6)

## 2024-04-30 LAB — LIPID PANEL

## 2024-04-30 MED ORDER — TIRZEPATIDE 5 MG/0.5ML ~~LOC~~ SOAJ: 5.0000 mg | SUBCUTANEOUS | 2 refills | Status: DC

## 2024-04-30 NOTE — Progress Notes (Signed)
 Subjective:    Patient ID: Elizabeth Brewer, female    DOB: 1961-01-22, 63 y.o.   MRN: [id number]  Chief Complaint  Patient presents with   Hypothyroidism   PT presents to the office today for CPE.   She was started on Ozempic  2 mg. Her starting weight was 222 lb.      04/30/2024    1:35 PM 02/24/2024   11:29 AM 01/21/2024   11:36 AM  Last 3 Weights  Weight (lbs) 222 lb 9.6 oz 226 lb 228 lb  Weight (kg) 100.971 kg 102.513 kg 103.42 kg     PT is followed by a neurologists every 3 months for chronic neck pain,seizure, and migraines. These are stable at this time.    She is followed by [facility] every 3 months.  Not controlled, reports she has been crying for two weeks.    She states her brother died in 11/10/23 and her husband shot himself. She has had a great deal of anxiety with this.    She has COPD and continues to smoke 1/2 pack a day. Has intermittent SOB and cough. Using Breo daily. Using nicotine  patches.    She saw ENT for hoarse voice. They cleared her.   Has aortic atherosclerosis and takes Crestor  20 mg daily.    She is morbid obese with a BMI of 40 and HTN and Hyperlipidemia. Hypertension This is a chronic problem. The current episode started more than 1 year ago. The problem has been waxing and waning since onset. The problem is uncontrolled. Associated symptoms include anxiety, malaise/fatigue, peripheral edema (some times) and shortness of breath. Pertinent negatives include no blurred vision. Risk factors for coronary artery disease include dyslipidemia, diabetes mellitus, obesity, sedentary lifestyle and post-menopausal state. Past treatments include lifestyle changes. The current treatment provides moderate improvement. Identifiable causes of hypertension include a thyroid  problem.  Gastroesophageal Reflux She complains of belching, heartburn and a hoarse voice. This is a chronic problem. The current episode started more than 1 year ago. The problem occurs  occasionally. The symptoms are aggravated by certain foods. Associated symptoms include fatigue. Risk factors include obesity. She has tried a PPI for the symptoms. The treatment provided moderate relief.  Thyroid  Problem Presents for follow-up visit. Symptoms include anxiety, constipation, depressed mood, dry skin, fatigue and hoarse voice. The symptoms have been stable.  Diabetes She presents for her follow-up diabetic visit. She has type 2 diabetes mellitus. Hypoglycemia symptoms include nervousness/anxiousness. Associated symptoms include fatigue. Pertinent negatives for diabetes include no blurred vision and no foot paresthesias. Symptoms are stable. Risk factors for coronary artery disease include dyslipidemia, diabetes mellitus, hypertension, sedentary lifestyle and post-menopausal. She is following a generally unhealthy diet. Her overall blood glucose range is 140-180 mg/dl. Eye exam is current.  Hyperlipidemia This is a chronic problem. The current episode started more than 1 year ago. The problem is uncontrolled. Recent lipid tests were reviewed and are high. Exacerbating diseases include obesity. Associated symptoms include shortness of breath. Current antihyperlipidemic treatment includes statins. The current treatment provides moderate improvement of lipids. Risk factors for coronary artery disease include diabetes mellitus, dyslipidemia, a sedentary lifestyle, hypertension and post-menopausal.  Depression        This is a chronic problem.  The current episode started more than 1 year ago.   The problem occurs intermittently.  Associated symptoms include fatigue, helplessness, hopelessness, restlessness and sad.  Past treatments include SSRIs - Selective serotonin reuptake inhibitors.  Past medical history includes thyroid  problem  and anxiety.   Constipation This is a chronic problem. The current episode started more than 1 year ago. Her stool frequency is 2 to 3 times per week. Pertinent  negatives include no vomiting. Risk factors include obesity. She has tried fiber and laxatives (linzess ) for the symptoms. The treatment provided mild relief.  Anxiety Presents for follow-up visit. Symptoms include depressed mood, excessive worry, irritability, nervous/anxious behavior, restlessness and shortness of breath. Symptoms occur occasionally. The severity of symptoms is moderate.    Dysuria  This is a new problem. The current episode started 1 to 4 weeks ago. The problem occurs intermittently. The problem has been gradually worsening. The quality of the pain is described as burning. The pain is at a severity of 2/10. The pain is mild. Associated symptoms include frequency. Pertinent negatives include no flank pain, hematuria, urgency or vomiting. She has tried increased fluids for the symptoms. The treatment provided no relief.      Review of Systems  Constitutional:  Positive for fatigue, irritability and malaise/fatigue.  HENT:  Positive for hoarse voice.   Eyes:  Negative for blurred vision.  Respiratory:  Positive for shortness of breath.   Gastrointestinal:  Positive for constipation and heartburn. Negative for vomiting.  Genitourinary:  Positive for dysuria and frequency. Negative for flank pain, hematuria and urgency.  Psychiatric/Behavioral:  The patient is nervous/anxious.   All other systems reviewed and are negative.  Family History  Problem Relation Age of Onset   Colon cancer Mother        at age 52   Heart disease Father    Heart attack Brother    Social History   Socioeconomic History   Marital status: Widowed    Spouse name: Not on file   Number of children: 1   Years of education: Not on file   Highest education level: Not on file  Occupational History   Occupation: disability    Employer: [employer]  Tobacco Use   Smoking status: Every Day    Current packs/day: 1.00    Average packs/day: 1 pack/day for 46.7 years (46.7 ttl pk-yrs)    Types:  Cigarettes    Start date: 1979   Smokeless tobacco: Never  Vaping Use   Vaping status: Never Used  Substance and Sexual Activity   Alcohol  use: No   Drug use: No   Sexual activity: Not on file  Other Topics Concern   Not on file  Social History Narrative   Lives alone(widowed) son stays at times    Social Drivers of Health   Financial Resource Strain: Low Risk  (12/26/2023)   Overall Financial Resource Strain (CARDIA)    Difficulty of Paying Living Expenses: Not hard at all  Food Insecurity: No Food Insecurity (12/26/2023)   Hunger Vital Sign    Worried About Running Out of Food in the Last Year: Never true    Ran Out of Food in the Last Year: Never true  Transportation Needs: No Transportation Needs (12/26/2023)   PRAPARE - Administrator, Civil Service (Medical): No    Lack of Transportation (Non-Medical): No  Physical Activity: Insufficiently Active (12/26/2023)   Exercise Vital Sign    Days of Exercise per Week: 4 days    Minutes of Exercise per Session: 20 min  Stress: No Stress Concern Present (12/26/2023)   [employer] of Occupational Health - Occupational Stress Questionnaire    Feeling of Stress : Not at all  Social Connections: Socially Isolated (12/26/2023)  Social Connection and Isolation Panel    Frequency of Communication with Friends and Family: Three times a week    Frequency of Social Gatherings with Friends and Family: Three times a week    Attends Religious Services: Never    Active Member of Clubs or Organizations: No    Attends Banker Meetings: Never    Marital Status: Divorced        Objective:   Physical Exam Vitals reviewed.  Constitutional:      General: She is not in acute distress.    Appearance: She is well-developed. She is obese.  HENT:     Head: Normocephalic and atraumatic.     Right Ear: Tympanic membrane normal.     Left Ear: Tympanic membrane normal.  Eyes:     Pupils: Pupils are equal, round, and  reactive to light.  Neck:     Thyroid : No thyromegaly.  Cardiovascular:     Rate and Rhythm: Normal rate and regular rhythm.     Heart sounds: Normal heart sounds. No murmur heard. Pulmonary:     Effort: Pulmonary effort is normal. No respiratory distress.     Breath sounds: Wheezing present.  Abdominal:     General: Bowel sounds are normal. There is no distension.     Palpations: Abdomen is soft.     Tenderness: There is no abdominal tenderness.  Musculoskeletal:        General: No tenderness. Normal range of motion.     Cervical back: Normal range of motion and neck supple.     Right lower leg: Edema (trace) present.     Left lower leg: Edema (trace) present.  Skin:    General: Skin is warm and dry.  Neurological:     Mental Status: She is alert and oriented to person, place, and time.     Cranial Nerves: No cranial nerve deficit.     Deep Tendon Reflexes: Reflexes are normal and symmetric.  Psychiatric:        Mood and Affect: Mood is anxious. Affect is tearful.        Behavior: Behavior normal.        Thought Content: Thought content normal.        Judgment: Judgment normal.       BP (!) 176/77   Pulse 77   Temp 97.7 F (36.5 C)   Ht 5' 2 (1.575 m)   Wt 222 lb 9.6 oz (101 kg)   SpO2 98%   BMI 40.71 kg/m      Assessment & Plan:  Elizabeth Brewer comes in today with chief complaint of Hypothyroidism   Diagnosis and orders addressed:  1. Hypothyroidism, unspecified type - CMP14+EGFR - CBC with Differential/Platelet - TSH  2. Hyperlipidemia, unspecified hyperlipidemia type - CMP14+EGFR - CBC with Differential/Platelet - Lipid panel  3. Moderate episode of recurrent major depressive disorder (HCC) - CMP14+EGFR - CBC with Differential/Platelet  4. TOBACCO ABUSE - CMP14+EGFR - CBC with Differential/Platelet  5. Essential hypertension - CMP14+EGFR - CBC with Differential/Platelet  6. Gastroesophageal reflux disease, unspecified whether esophagitis  present - CMP14+EGFR - CBC with Differential/Platelet  7. Morbid obesity (HCC) - CMP14+EGFR - CBC with Differential/Platelet  8. Vitamin D  deficiency - CMP14+EGFR - CBC with Differential/Platelet - VITAMIN D  25 Hydroxy (Vit-D Deficiency, Fractures)  9. Constipation, unspecified constipation type - CMP14+EGFR - CBC with Differential/Platelet  10. Chronic obstructive pulmonary disease, unspecified COPD type (HCC) - CMP14+EGFR - CBC with Differential/Platelet  11. Aortic atherosclerosis (  HCC)  - CMP14+EGFR - CBC with Differential/Platelet  12. Type 2 diabetes mellitus with other specified complication, without long-term current use of insulin (HCC) - CMP14+EGFR - CBC with Differential/Platelet - Bayer DCA Hb A1c Waived - Vitamin B12 - tirzepatide  (MOUNJARO ) 5 MG/0.5ML Pen; Inject 5 mg into the skin once a week.  Dispense: 6 mL; Refill: 2  13. Annual physical exam (Primary - CMP14+EGFR - CBC with Differential/Platelet - Bayer DCA Hb A1c Waived - Lipid panel - TSH - Vitamin B12 - VITAMIN D  25 Hydroxy (Vit-D Deficiency, Fractures)   Labs pending Will change Ozempic  2 mg to Mounjaro  0.5 mg  Continue current medications  Keep follow up with specialists  Will discuss worsening depression and anxiety with behavorial health later this month.  Health Maintenance reviewed Diet and exercise encouraged  Follow up plan: 2 months for Diabetes and starting Mounjaro     Bari Learn, FNP

## 2024-04-30 NOTE — Patient Instructions (Signed)

## 2024-05-01 ENCOUNTER — Ambulatory Visit: Payer: Self-pay

## 2024-05-01 ENCOUNTER — Other Ambulatory Visit: Payer: Self-pay | Admitting: Family

## 2024-05-01 ENCOUNTER — Ambulatory Visit: Payer: Self-pay | Admitting: Family

## 2024-05-01 LAB — CBC WITH DIFFERENTIAL/PLATELET
Basophils Absolute: 0.1 x10E3/uL (ref 0.0–0.2)
Basos: 1 %
EOS (ABSOLUTE): 0.1 x10E3/uL (ref 0.0–0.4)
Eos: 1 %
Hematocrit: 40.4 % (ref 34.0–46.6)
Hemoglobin: 13.9 g/dL (ref 11.1–15.9)
Immature Grans (Abs): 0.1 x10E3/uL (ref 0.0–0.1)
Immature Granulocytes: 1 %
Lymphocytes Absolute: 1.8 x10E3/uL (ref 0.7–3.1)
Lymphs: 17 %
MCH: 34.1 pg — ABNORMAL HIGH (ref 26.6–33.0)
MCHC: 34.4 g/dL (ref 31.5–35.7)
MCV: 99 fL — ABNORMAL HIGH (ref 79–97)
Monocytes Absolute: 0.5 x10E3/uL (ref 0.1–0.9)
Monocytes: 5 %
Neutrophils Absolute: 7.9 x10E3/uL — ABNORMAL HIGH (ref 1.4–7.0)
Neutrophils: 75 %
Platelets: 240 x10E3/uL (ref 150–450)
RBC: 4.08 x10E6/uL (ref 3.77–5.28)
RDW: 12.2 % (ref 11.7–15.4)
WBC: 10.5 x10E3/uL (ref 3.4–10.8)

## 2024-05-01 LAB — CMP14+EGFR
ALT: 13 IU/L (ref 0–32)
AST: 15 IU/L (ref 0–40)
Albumin: 4.4 g/dL (ref 3.9–4.9)
Alkaline Phosphatase: 93 IU/L (ref 44–121)
BUN/Creatinine Ratio: 11 — AB (ref 12–28)
BUN: 10 mg/dL (ref 8–27)
Bilirubin Total: 0.4 mg/dL (ref 0.0–1.2)
CO2: 23 mmol/L (ref 20–29)
Calcium: 9.5 mg/dL (ref 8.7–10.3)
Chloride: 97 mmol/L (ref 96–106)
Creatinine, Ser: 0.89 mg/dL (ref 0.57–1.00)
Globulin, Total: 2.5 g/dL (ref 1.5–4.5)
Glucose: 164 mg/dL — AB (ref 70–99)
Potassium: 3.6 mmol/L (ref 3.5–5.2)
Sodium: 136 mmol/L (ref 134–144)
Total Protein: 6.9 g/dL (ref 6.0–8.5)
eGFR: 73 mL/min/1.73 (ref >59)

## 2024-05-01 LAB — LIPID PANEL
Cholesterol, Total: 187 mg/dL (ref 100–199)
HDL: 54 mg/dL (ref >39)
LDL CALC COMMENT:: 3.5 ratio (ref 0.0–4.4)
LDL Chol Calc (NIH): 114 mg/dL — AB (ref 0–99)
Triglycerides: 104 mg/dL (ref 0–149)
VLDL Cholesterol Cal: 19 mg/dL (ref 5–40)

## 2024-05-01 LAB — VITAMIN D 25 HYDROXY (VIT D DEFICIENCY, FRACTURES): Vit D, 25-Hydroxy: 28.4 ng/mL — AB (ref 30.0–100.0)

## 2024-05-01 LAB — VITAMIN B12: Vitamin B-12: 241 pg/mL (ref 232–1245)

## 2024-05-01 LAB — TSH: TSH: 20.5 u[IU]/mL — AB (ref 0.450–4.500)

## 2024-05-01 MED ORDER — LEVOTHYROXINE SODIUM 125 MCG PO TABS: 125.0000 ug | ORAL_TABLET | Freq: Every day | ORAL | 1 refills | Status: DC

## 2024-05-01 MED ORDER — VITAMIN D (ERGOCALCIFEROL) 1.25 MG (50000 UNIT) PO CAPS: 50000.0000 [IU] | ORAL_CAPSULE | ORAL | 3 refills | Status: AC

## 2024-05-01 NOTE — Telephone Encounter (Signed)
 FYI Only or Action Required?: FYI only for provider. - Answered pt question about results, please call back if other/further recommendations  Patient was last seen in primary care on 04/30/2024 by Lavell Bari LABOR, FNP.  Called Nurse Triage reporting Results.  Triage Disposition: Information or Advice Only Call  Patient/caregiver understands and will follow disposition?: Yes    Copied from CRM #[id number]. Topic: Clinical - Lab/Test Results >> May 01, 2024  2:30 PM Tobias L wrote: Reason for CRM: Relayed results to patient, requesting to speak to nurse. Reason for Disposition  Health information question, no triage required and triager able to answer question  Answer Assessment - Initial Assessment Questions 1. REASON FOR CALL: What is the main reason for your call? or How can I best help you?     Pt wanted clarification on info relayed to her by PAS  This nurse relayed the following info:  Lavell Bari LABOR, FNP to [facility]     05/01/24  1:18 PM Result Note CBC stable LDL elevated- The 10-year ASCVD risk score (Arnett DK, et al., 2019) is: 21.8%- Continue Crestor , low fat diet and exercise  Thyroid  abnormal- Levothyroxine  increased to 125 mcg from 112 mcg, make sure to take every morning on empty stomach.  Vit D levels low- Prescription sent to pharmacy  Kidney and liver function stable A1C at goal  Vit B 12 normal   Pt clarifies she was confused about the ASCVD risk score. Nurse explained to pt that the ASCVD risk score is something that all pt's want to reduce as much as possible and to continue the care plan that PCP has set for her with crestor , low fat diet, and exercise to reduce her individual risk of developing cardiovascular disease in the next 10 years.  3. OTHER QUESTIONS: Do you have any other questions?     No further questions or concerns at this time.  Protocols used: Information Only Call - No Triage-A-AH

## 2024-05-25 ENCOUNTER — Other Ambulatory Visit: Payer: Self-pay | Admitting: Family

## 2024-05-25 DIAGNOSIS — J449 Chronic obstructive pulmonary disease, unspecified: Secondary | ICD-10-CM

## 2024-05-30 ENCOUNTER — Other Ambulatory Visit: Payer: Self-pay | Admitting: Family

## 2024-06-11 ENCOUNTER — Other Ambulatory Visit: Payer: Self-pay | Admitting: Family

## 2024-06-11 DIAGNOSIS — R490 Dysphonia: Secondary | ICD-10-CM

## 2024-06-11 DIAGNOSIS — I10 Essential (primary) hypertension: Secondary | ICD-10-CM

## 2024-06-29 ENCOUNTER — Ambulatory Visit: Admitting: Family

## 2024-06-29 DIAGNOSIS — G43109 Migraine with aura, not intractable, without status migrainosus: Secondary | ICD-10-CM | POA: Diagnosis not present

## 2024-06-29 DIAGNOSIS — M5459 Other low back pain: Secondary | ICD-10-CM | POA: Diagnosis not present

## 2024-06-29 DIAGNOSIS — M542 Cervicalgia: Secondary | ICD-10-CM | POA: Diagnosis not present

## 2024-07-02 ENCOUNTER — Encounter: Payer: Self-pay | Admitting: Family

## 2024-07-02 ENCOUNTER — Ambulatory Visit (INDEPENDENT_AMBULATORY_CARE_PROVIDER_SITE_OTHER): Admitting: Family

## 2024-07-02 VITALS — BP 128/74 | HR 87 | Temp 97.6°F | Ht 62.0 in | Wt 221.0 lb

## 2024-07-02 DIAGNOSIS — E785 Hyperlipidemia, unspecified: Secondary | ICD-10-CM

## 2024-07-02 DIAGNOSIS — I700 Atherosclerosis of aorta: Secondary | ICD-10-CM

## 2024-07-02 DIAGNOSIS — E1169 Type 2 diabetes mellitus with other specified complication: Secondary | ICD-10-CM

## 2024-07-02 DIAGNOSIS — K5900 Constipation, unspecified: Secondary | ICD-10-CM

## 2024-07-02 DIAGNOSIS — Z79899 Other long term (current) drug therapy: Secondary | ICD-10-CM

## 2024-07-02 DIAGNOSIS — F1320 Sedative, hypnotic or anxiolytic dependence, uncomplicated: Secondary | ICD-10-CM | POA: Diagnosis not present

## 2024-07-02 DIAGNOSIS — E039 Hypothyroidism, unspecified: Secondary | ICD-10-CM

## 2024-07-02 DIAGNOSIS — F17200 Nicotine dependence, unspecified, uncomplicated: Secondary | ICD-10-CM

## 2024-07-02 DIAGNOSIS — J449 Chronic obstructive pulmonary disease, unspecified: Secondary | ICD-10-CM

## 2024-07-02 DIAGNOSIS — F419 Anxiety disorder, unspecified: Secondary | ICD-10-CM

## 2024-07-02 DIAGNOSIS — K219 Gastro-esophageal reflux disease without esophagitis: Secondary | ICD-10-CM

## 2024-07-02 DIAGNOSIS — Z23 Encounter for immunization: Secondary | ICD-10-CM | POA: Diagnosis not present

## 2024-07-02 DIAGNOSIS — I10 Essential (primary) hypertension: Secondary | ICD-10-CM

## 2024-07-02 MED ORDER — FLUTICASONE FUROATE-VILANTEROL 200-25 MCG/ACT IN AEPB: 1.0000 | INHALATION_SPRAY | Freq: Every day | RESPIRATORY_TRACT | 2 refills | Status: DC

## 2024-07-02 MED ORDER — CITALOPRAM HYDROBROMIDE 40 MG PO TABS: ORAL_TABLET | ORAL | 1 refills | Status: DC

## 2024-07-02 MED ORDER — TIRZEPATIDE 7.5 MG/0.5ML ~~LOC~~ SOAJ: 7.5000 mg | SUBCUTANEOUS | 2 refills | Status: AC

## 2024-07-02 MED ORDER — CLONAZEPAM 1 MG PO TABS: ORAL_TABLET | ORAL | 2 refills | Status: AC

## 2024-07-02 NOTE — Progress Notes (Signed)
 Subjective:    Patient ID: Elizabeth Brewer, female    DOB: April 17, 1961, 63 y.o.   MRN: [id number]  Chief Complaint  Patient presents with   Medical Management of Chronic Issues   PT presents to the office today for chronic follow up.  She was started on Ozempic  2 mg and now Monujaro 5 mg. Her starting weight was 222 lb.      07/02/2024    9:14 AM 04/30/2024    1:35 PM 02/24/2024   11:29 AM  Last 3 Weights  Weight (lbs) 221 lb 222 lb 9.6 oz 226 lb  Weight (kg) 100.245 kg 100.971 kg 102.513 kg     PT is followed by a neurologists every 3 months for chronic neck pain,seizure, and migraines. These are stable at this time.    She is followed by [facility] every 6 months.  Has improved.    She states her brother died in 10/27/2023 and her husband shot himself. She has had a great deal of anxiety with this.    She has COPD and continues to smoke 1/2 pack a day. Has intermittent SOB and cough. Using Breo daily. Using nicotine  patches.    She saw ENT for hoarse voice. They cleared her.   Has aortic atherosclerosis and takes Crestor  20 mg daily.    She is morbid obese with a BMI of 40 and HTN and Hyperlipidemia. Hypertension This is a chronic problem. The current episode started more than 1 year ago. The problem has been resolved since onset. The problem is controlled. Associated symptoms include anxiety, malaise/fatigue and shortness of breath (A little bit). Pertinent negatives include no blurred vision or peripheral edema. Risk factors for coronary artery disease include dyslipidemia, diabetes mellitus, obesity, sedentary lifestyle and post-menopausal state. Past treatments include lifestyle changes. The current treatment provides moderate improvement. Identifiable causes of hypertension include a thyroid  problem.  Gastroesophageal Reflux She complains of belching, heartburn and a hoarse voice. This is a chronic problem. The current episode started more than 1 year ago. The problem  occurs occasionally. The symptoms are aggravated by certain foods. Associated symptoms include fatigue. Risk factors include obesity. She has tried a PPI for the symptoms. The treatment provided moderate relief.  Thyroid  Problem Presents for follow-up visit. Symptoms include anxiety, constipation, depressed mood, dry skin, fatigue and hoarse voice. The symptoms have been stable.  Diabetes She presents for her follow-up diabetic visit. She has type 2 diabetes mellitus. Hypoglycemia symptoms include nervousness/anxiousness. Associated symptoms include fatigue. Pertinent negatives for diabetes include no blurred vision and no foot paresthesias. Symptoms are stable. Risk factors for coronary artery disease include dyslipidemia, diabetes mellitus, hypertension, sedentary lifestyle, post-menopausal and obesity. She is following a generally unhealthy diet. Her overall blood glucose range is 140-180 mg/dl. Eye exam is current.  Hyperlipidemia This is a chronic problem. The current episode started more than 1 year ago. The problem is uncontrolled. Recent lipid tests were reviewed and are high. Exacerbating diseases include obesity. Associated symptoms include shortness of breath (A little bit). Current antihyperlipidemic treatment includes statins. The current treatment provides moderate improvement of lipids. Risk factors for coronary artery disease include diabetes mellitus, dyslipidemia, a sedentary lifestyle, hypertension, post-menopausal and obesity.  Depression        This is a chronic problem.  The current episode started more than 1 year ago.   The problem occurs intermittently.  Associated symptoms include fatigue, restlessness and sad.  Associated symptoms include no helplessness and no hopelessness.  Past  treatments include SSRIs - Selective serotonin reuptake inhibitors.  Past medical history includes thyroid  problem and anxiety.   Constipation This is a chronic problem. The current episode started more  than 1 year ago. Her stool frequency is 2 to 3 times per week. Risk factors include obesity. She has tried fiber and laxatives (linzess ) for the symptoms. The treatment provided mild relief.  Anxiety Presents for follow-up visit. Symptoms include depressed mood, excessive worry, irritability, nervous/anxious behavior, restlessness and shortness of breath (A little bit). Symptoms occur most days. The severity of symptoms is moderate.        Review of Systems  Constitutional:  Positive for fatigue, irritability and malaise/fatigue.  HENT:  Positive for hoarse voice.   Eyes:  Negative for blurred vision.  Respiratory:  Positive for shortness of breath (A little bit).   Gastrointestinal:  Positive for constipation and heartburn.  Psychiatric/Behavioral:  The patient is nervous/anxious.   All other systems reviewed and are negative.  Family History  Problem Relation Age of Onset   Colon cancer Mother        at age 60   Heart disease Father    Heart attack Brother    Social History   Socioeconomic History   Marital status: Widowed    Spouse name: Not on file   Number of children: 1   Years of education: Not on file   Highest education level: Not on file  Occupational History   Occupation: disability    Employer: [employer]  Tobacco Use   Smoking status: Every Day    Current packs/day: 1.00    Average packs/day: 1 pack/day for 46.8 years (46.8 ttl pk-yrs)    Types: Cigarettes    Start date: 1979   Smokeless tobacco: Never  Vaping Use   Vaping status: Never Used  Substance and Sexual Activity   Alcohol  use: No   Drug use: No   Sexual activity: Not on file  Other Topics Concern   Not on file  Social History Narrative   Lives alone(widowed) son stays at times    Social Drivers of Health   Financial Resource Strain: Low Risk  (12/26/2023)   Overall Financial Resource Strain (CARDIA)    Difficulty of Paying Living Expenses: Not hard at all  Food Insecurity: No Food  Insecurity (12/26/2023)   Hunger Vital Sign    Worried About Running Out of Food in the Last Year: Never true    Ran Out of Food in the Last Year: Never true  Transportation Needs: No Transportation Needs (12/26/2023)   PRAPARE - Administrator, Civil Service (Medical): No    Lack of Transportation (Non-Medical): No  Physical Activity: Insufficiently Active (12/26/2023)   Exercise Vital Sign    Days of Exercise per Week: 4 days    Minutes of Exercise per Session: 20 min  Stress: No Stress Concern Present (12/26/2023)   [employer] of Occupational Health - Occupational Stress Questionnaire    Feeling of Stress : Not at all  Social Connections: Socially Isolated (12/26/2023)   Social Connection and Isolation Panel    Frequency of Communication with Friends and Family: Three times a week    Frequency of Social Gatherings with Friends and Family: Three times a week    Attends Religious Services: Never    Active Member of Clubs or Organizations: No    Attends Banker Meetings: Never    Marital Status: Divorced        Objective:  Physical Exam Vitals reviewed.  Constitutional:      General: She is not in acute distress.    Appearance: She is well-developed. She is obese.  HENT:     Head: Normocephalic and atraumatic.     Right Ear: Tympanic membrane normal.     Left Ear: Tympanic membrane normal.  Eyes:     Pupils: Pupils are equal, round, and reactive to light.  Neck:     Thyroid : No thyromegaly.  Cardiovascular:     Rate and Rhythm: Normal rate and regular rhythm.     Heart sounds: Normal heart sounds. No murmur heard. Pulmonary:     Effort: Pulmonary effort is normal. No respiratory distress.     Breath sounds: Wheezing and rhonchi present.  Abdominal:     General: Bowel sounds are normal. There is no distension.     Palpations: Abdomen is soft.     Tenderness: There is no abdominal tenderness.  Musculoskeletal:        General: No tenderness.  Normal range of motion.     Cervical back: Normal range of motion and neck supple.     Right lower leg: Edema (trace) present.     Left lower leg: Edema (trace) present.  Skin:    General: Skin is warm and dry.  Neurological:     Mental Status: She is alert and oriented to person, place, and time.     Cranial Nerves: No cranial nerve deficit.     Deep Tendon Reflexes: Reflexes are normal and symmetric.  Psychiatric:        Mood and Affect: Mood is anxious. Affect is tearful.        Behavior: Behavior normal.        Thought Content: Thought content normal.        Judgment: Judgment normal.       BP 128/74   Pulse 87   Temp 97.6 F (36.4 C) (Temporal)   Ht 5' 2 (1.575 m)   Wt 221 lb (100.2 kg)   SpO2 96%   BMI 40.42 kg/m      Assessment & Plan:  Elizabeth Brewer comes in today with chief complaint of Medical Management of Chronic Issues   Diagnosis and orders addressed:  1. Anxiety - citalopram  (CELEXA ) 40 MG tablet; TAKE ONE (1) TABLET EACH DAY  Dispense: 90 tablet; Refill: 1 - clonazePAM  (KLONOPIN ) 1 MG tablet; TAKE 1 TABLET DAILY AS NEEDED FOR ANXIETY  Dispense: 30 tablet; Refill: 2  2. Benzodiazepine dependence (HCC) - citalopram  (CELEXA ) 40 MG tablet; TAKE ONE (1) TABLET EACH DAY  Dispense: 90 tablet; Refill: 1 - clonazePAM  (KLONOPIN ) 1 MG tablet; TAKE 1 TABLET DAILY AS NEEDED FOR ANXIETY  Dispense: 30 tablet; Refill: 2  3. Controlled substance agreement signed - clonazePAM  (KLONOPIN ) 1 MG tablet; TAKE 1 TABLET DAILY AS NEEDED FOR ANXIETY  Dispense: 30 tablet; Refill: 2  4. Chronic obstructive pulmonary disease, unspecified COPD type (HCC) - fluticasone  furoate-vilanterol (BREO ELLIPTA ) 200-25 MCG/ACT AEPB; Inhale 1 puff into the lungs daily.  Dispense: 180 each; Refill: 2  5. TOBACCO ABUSE   6. Morbid obesity (HCC)  7. Hypothyroidism, unspecified type  8. Hyperlipidemia, unspecified hyperlipidemia type   9. Gastroesophageal reflux disease, unspecified  whether esophagitis present  10. Essential hypertension  11. Type 2 diabetes mellitus with other specified complication, without long-term current use of insulin (HCC) (Primary)  - tirzepatide  (MOUNJARO ) 7.5 MG/0.5ML Pen; Inject 7.5 mg into the skin once a week.  Dispense: 6  mL; Refill: 2  12. Constipation, unspecified constipation type  13. Aortic atherosclerosis   14. Immunization due - Pneumococcal conjugate vaccine 20-valent (Prevnar 20)    Labs reviewed from 04/30/24 Will increase Mounjaro  7.5 mg  Continue current medications  Keep follow up with specialists  Health Maintenance reviewed Diet and exercise encouraged  Follow up plan: 3 months for chronic follow up   Bari Learn, FNP

## 2024-07-02 NOTE — Patient Instructions (Signed)

## 2024-07-28 ENCOUNTER — Other Ambulatory Visit: Payer: Self-pay | Admitting: Family

## 2024-08-01 ENCOUNTER — Other Ambulatory Visit: Payer: Self-pay | Admitting: Family

## 2024-08-01 DIAGNOSIS — E039 Hypothyroidism, unspecified: Secondary | ICD-10-CM

## 2024-08-01 DIAGNOSIS — E876 Hypokalemia: Secondary | ICD-10-CM

## 2024-08-10 ENCOUNTER — Ambulatory Visit (HOSPITAL_COMMUNITY)

## 2024-08-13 ENCOUNTER — Ambulatory Visit (HOSPITAL_COMMUNITY)

## 2024-08-19 ENCOUNTER — Other Ambulatory Visit: Payer: Self-pay | Admitting: Family

## 2024-08-19 ENCOUNTER — Other Ambulatory Visit (INDEPENDENT_AMBULATORY_CARE_PROVIDER_SITE_OTHER): Payer: Self-pay | Admitting: Otolaryngology

## 2024-08-19 DIAGNOSIS — I10 Essential (primary) hypertension: Secondary | ICD-10-CM

## 2024-08-19 DIAGNOSIS — R490 Dysphonia: Secondary | ICD-10-CM

## 2024-08-19 DIAGNOSIS — E039 Hypothyroidism, unspecified: Secondary | ICD-10-CM

## 2024-09-06 ENCOUNTER — Other Ambulatory Visit: Payer: Self-pay | Admitting: Family

## 2024-09-06 DIAGNOSIS — E039 Hypothyroidism, unspecified: Secondary | ICD-10-CM

## 2024-09-23 ENCOUNTER — Telehealth: Payer: Self-pay

## 2024-09-23 DIAGNOSIS — G43109 Migraine with aura, not intractable, without status migrainosus: Secondary | ICD-10-CM

## 2024-09-23 NOTE — Telephone Encounter (Signed)
 Copied from CRM [id number]. Topic: Referral - Request for Referral >> Sep 23, 2024  3:02 PM Mercer PEDLAR wrote: Did the patient discuss referral with their provider in the last year? No (If No - schedule appointment) (If Yes - send message)  Appointment offered? No  Type of order/referral and detailed reason for visit: Elizabeth Brewer is calling from [facility] to request a Neurology referral. She stated patient has been being seen there but due to insurance changes a referral is required. Patient has an appointment on 09/28/24 and they are requesting referral prior to that appointment.   Preference of office, provider, location:  [facility] [address], [place], [place] [address] Fax: [phone] Phone: [phone]  If referral order, have you been seen by this specialty before? Yes (If Yes, this issue or another issue? When? Where?  Can we respond through MyChart? No

## 2024-09-25 NOTE — Telephone Encounter (Signed)
 Referral placed

## 2024-09-25 NOTE — Addendum Note (Signed)
 Addended by: LAVELL LYE A on: 09/25/2024 09:10 AM   Modules accepted: Orders

## 2024-10-02 ENCOUNTER — Encounter: Payer: Self-pay | Admitting: Family

## 2024-10-02 ENCOUNTER — Ambulatory Visit: Admitting: Family

## 2024-10-02 VITALS — BP 129/78 | HR 70 | Temp 98.1°F | Ht 62.0 in | Wt 220.0 lb

## 2024-10-02 DIAGNOSIS — Z79899 Other long term (current) drug therapy: Secondary | ICD-10-CM

## 2024-10-02 DIAGNOSIS — E1169 Type 2 diabetes mellitus with other specified complication: Secondary | ICD-10-CM

## 2024-10-02 DIAGNOSIS — Z7985 Long-term (current) use of injectable non-insulin antidiabetic drugs: Secondary | ICD-10-CM | POA: Diagnosis not present

## 2024-10-02 DIAGNOSIS — J449 Chronic obstructive pulmonary disease, unspecified: Secondary | ICD-10-CM | POA: Diagnosis not present

## 2024-10-02 DIAGNOSIS — R490 Dysphonia: Secondary | ICD-10-CM

## 2024-10-02 DIAGNOSIS — F4321 Adjustment disorder with depressed mood: Secondary | ICD-10-CM | POA: Diagnosis not present

## 2024-10-02 DIAGNOSIS — F1320 Sedative, hypnotic or anxiolytic dependence, uncomplicated: Secondary | ICD-10-CM | POA: Diagnosis not present

## 2024-10-02 DIAGNOSIS — K5900 Constipation, unspecified: Secondary | ICD-10-CM | POA: Diagnosis not present

## 2024-10-02 DIAGNOSIS — I10 Essential (primary) hypertension: Secondary | ICD-10-CM

## 2024-10-02 DIAGNOSIS — K219 Gastro-esophageal reflux disease without esophagitis: Secondary | ICD-10-CM | POA: Diagnosis not present

## 2024-10-02 DIAGNOSIS — F331 Major depressive disorder, recurrent, moderate: Secondary | ICD-10-CM

## 2024-10-02 DIAGNOSIS — F419 Anxiety disorder, unspecified: Secondary | ICD-10-CM | POA: Diagnosis not present

## 2024-10-02 DIAGNOSIS — G959 Disease of spinal cord, unspecified: Secondary | ICD-10-CM

## 2024-10-02 DIAGNOSIS — I700 Atherosclerosis of aorta: Secondary | ICD-10-CM

## 2024-10-02 DIAGNOSIS — E876 Hypokalemia: Secondary | ICD-10-CM

## 2024-10-02 LAB — BAYER DCA HB A1C WAIVED: HB A1C (BAYER DCA - WAIVED): 6.1 % — ABNORMAL HIGH (ref 4.8–5.6)

## 2024-10-02 MED ORDER — LISINOPRIL 40 MG PO TABS: 40.0000 mg | ORAL_TABLET | Freq: Every day | ORAL | 0 refills | Status: AC

## 2024-10-02 MED ORDER — POTASSIUM CHLORIDE CRYS ER 20 MEQ PO TBCR: 20.0000 meq | EXTENDED_RELEASE_TABLET | Freq: Two times a day (BID) | ORAL | 0 refills | Status: DC

## 2024-10-02 MED ORDER — BUSPIRONE HCL 7.5 MG PO TABS: 7.5000 mg | ORAL_TABLET | Freq: Three times a day (TID) | ORAL | 2 refills | Status: AC | PRN

## 2024-10-02 MED ORDER — FLUTICASONE FUROATE-VILANTEROL 200-25 MCG/ACT IN AEPB: 1.0000 | INHALATION_SPRAY | Freq: Every day | RESPIRATORY_TRACT | 2 refills | Status: AC

## 2024-10-02 MED ORDER — ROSUVASTATIN CALCIUM 20 MG PO TABS: 20.0000 mg | ORAL_TABLET | Freq: Every evening | ORAL | 0 refills | Status: AC

## 2024-10-02 MED ORDER — CITALOPRAM HYDROBROMIDE 40 MG PO TABS: ORAL_TABLET | ORAL | 1 refills | Status: AC

## 2024-10-02 NOTE — Patient Instructions (Signed)

## 2024-10-02 NOTE — Progress Notes (Signed)
 "  Subjective:    Patient ID: Elizabeth Brewer, female    DOB: 01-Feb-1961, 64 y.o.   MRN: [id number]  Chief Complaint  Patient presents with   Medical Management of Chronic Issues   PT presents to the office today for chronic follow up.  She was started on Ozempic  2 mg and now Monujaro 7.5 mg. Her starting weight was 222 lb.      10/02/2024   11:38 AM 07/02/2024    9:14 AM 04/30/2024    1:35 PM  Last 3 Weights  Weight (lbs) 220 lb 221 lb 222 lb 9.6 oz  Weight (kg) 99.791 kg 100.245 kg 100.971 kg     PT is followed by a neurologists every 3 months for chronic neck pain,seizure, and migraines. These are stable at this time.    She is followed by [facility] every 6 months.  Has improved.    She states her brother died in 11-14-2024 and her husband shot himself. She has had a great deal of anxiety with this.    She has COPD and continues to smoke 1/2 pack a day. Has intermittent SOB and cough. Using Breo daily. Using nicotine  patches.    She saw ENT for hoarse voice. They cleared her.   Has aortic atherosclerosis and takes Crestor  20 mg daily.    She is morbid obese with a BMI of 40 and HTN and Hyperlipidemia. Hypertension This is a chronic problem. The current episode started more than 1 year ago. The problem has been resolved since onset. The problem is controlled. Associated symptoms include anxiety, malaise/fatigue and shortness of breath (A little bit). Pertinent negatives include no blurred vision or peripheral edema. Risk factors for coronary artery disease include dyslipidemia, diabetes mellitus, obesity, sedentary lifestyle and post-menopausal state. Past treatments include lifestyle changes. The current treatment provides moderate improvement. Identifiable causes of hypertension include a thyroid  problem.  Gastroesophageal Reflux She complains of belching, heartburn and a hoarse voice. This is a chronic problem. The current episode started more than 1 year ago. The problem  occurs occasionally. The symptoms are aggravated by certain foods. Associated symptoms include fatigue. Risk factors include obesity. She has tried a PPI for the symptoms. The treatment provided moderate relief.  Thyroid  Problem Presents for follow-up visit. Symptoms include anxiety, constipation, depressed mood, dry skin, fatigue and hoarse voice. The symptoms have been stable.  Diabetes She presents for her follow-up diabetic visit. She has type 2 diabetes mellitus. Hypoglycemia symptoms include nervousness/anxiousness. Associated symptoms include fatigue. Pertinent negatives for diabetes include no blurred vision and no foot paresthesias. Symptoms are stable. Risk factors for coronary artery disease include dyslipidemia, diabetes mellitus, hypertension, sedentary lifestyle, post-menopausal and obesity. She is following a generally unhealthy diet. Her overall blood glucose range is 140-180 mg/dl. Eye exam is current.  Hyperlipidemia This is a chronic problem. The current episode started more than 1 year ago. The problem is uncontrolled. Recent lipid tests were reviewed and are high. Exacerbating diseases include obesity. Associated symptoms include shortness of breath (A little bit). Current antihyperlipidemic treatment includes statins. The current treatment provides moderate improvement of lipids. Risk factors for coronary artery disease include diabetes mellitus, dyslipidemia, a sedentary lifestyle, hypertension, post-menopausal and obesity.  Depression        This is a chronic problem.  The current episode started more than 1 year ago.   The problem occurs intermittently.  Associated symptoms include fatigue, restlessness and sad.  Associated symptoms include no helplessness and no hopelessness.  Past treatments include SSRIs - Selective serotonin reuptake inhibitors.  Past medical history includes thyroid  problem and anxiety.   Constipation This is a chronic problem. The current episode started more  than 1 year ago. Her stool frequency is 2 to 3 times per week. Risk factors include obesity. She has tried fiber and laxatives (linzess ) for the symptoms. The treatment provided mild relief.  Anxiety Presents for follow-up visit. Symptoms include depressed mood, excessive worry, irritability, nervous/anxious behavior, restlessness and shortness of breath (A little bit). Symptoms occur constantly. The severity of symptoms is severe.        Review of Systems  Constitutional:  Positive for fatigue, irritability and malaise/fatigue.  HENT:  Positive for hoarse voice.   Eyes:  Negative for blurred vision.  Respiratory:  Positive for shortness of breath (A little bit).   Gastrointestinal:  Positive for constipation and heartburn.  Psychiatric/Behavioral:  The patient is nervous/anxious.   All other systems reviewed and are negative.  Family History  Problem Relation Age of Onset   Colon cancer Mother        at age 31   Heart disease Father    Heart attack Brother    Social History   Socioeconomic History   Marital status: Widowed    Spouse name: Not on file   Number of children: 1   Years of education: Not on file   Highest education level: Not on file  Occupational History   Occupation: disability    Employer: [employer]  Tobacco Use   Smoking status: Every Day    Current packs/day: 1.00    Average packs/day: 1 pack/day for 47.1 years (47.1 ttl pk-yrs)    Types: Cigarettes    Start date: 1979   Smokeless tobacco: Never  Vaping Use   Vaping status: Never Used  Substance and Sexual Activity   Alcohol  use: No   Drug use: No   Sexual activity: Not on file  Other Topics Concern   Not on file  Social History Narrative   Lives alone(widowed) son stays at times    Social Drivers of Health   Tobacco Use: High Risk (10/02/2024)   Patient History    Smoking Tobacco Use: Every Day    Smokeless Tobacco Use: Never    Passive Exposure: Not on file  Financial Resource Strain:  Low Risk (12/26/2023)   Overall Financial Resource Strain (CARDIA)    Difficulty of Paying Living Expenses: Not hard at all  Food Insecurity: No Food Insecurity (12/26/2023)   Hunger Vital Sign    Worried About Running Out of Food in the Last Year: Never true    Ran Out of Food in the Last Year: Never true  Transportation Needs: No Transportation Needs (12/26/2023)   PRAPARE - Administrator, Civil Service (Medical): No    Lack of Transportation (Non-Medical): No  Physical Activity: Insufficiently Active (12/26/2023)   Exercise Vital Sign    Days of Exercise per Week: 4 days    Minutes of Exercise per Session: 20 min  Stress: No Stress Concern Present (12/26/2023)   [employer] of Occupational Health - Occupational Stress Questionnaire    Feeling of Stress : Not at all  Social Connections: Socially Isolated (12/26/2023)   Social Connection and Isolation Panel    Frequency of Communication with Friends and Family: Three times a week    Frequency of Social Gatherings with Friends and Family: Three times a week    Attends Religious Services: Never  Active Member of Clubs or Organizations: No    Attends Banker Meetings: Never    Marital Status: Divorced  Depression (PHQ2-9): Medium Risk (10/02/2024)   Depression (PHQ2-9)    PHQ-2 Score: 6  Alcohol  Screen: Low Risk (12/26/2023)   Alcohol  Screen    Last Alcohol  Screening Score (AUDIT): 0  Housing: Unknown (12/26/2023)   Housing Stability Vital Sign    Unable to Pay for Housing in the Last Year: No    Number of Times Moved in the Last Year: Not on file    Homeless in the Last Year: No  Utilities: Not At Risk (12/26/2023)   AHC Utilities    Threatened with loss of utilities: No  Health Literacy: Adequate Health Literacy (12/26/2023)   B1300 Health Literacy    Frequency of need for help with medical instructions: Never        Objective:   Physical Exam Vitals reviewed.  Constitutional:      General: She  is not in acute distress.    Appearance: She is well-developed. She is obese.  HENT:     Head: Normocephalic and atraumatic.     Right Ear: Tympanic membrane normal.     Left Ear: Tympanic membrane normal.  Eyes:     Pupils: Pupils are equal, round, and reactive to light.  Neck:     Thyroid : No thyromegaly.  Cardiovascular:     Rate and Rhythm: Normal rate and regular rhythm.     Heart sounds: Normal heart sounds. No murmur heard. Pulmonary:     Effort: Pulmonary effort is normal. No respiratory distress.     Breath sounds: Wheezing and rhonchi present.  Abdominal:     General: Bowel sounds are normal. There is no distension.     Palpations: Abdomen is soft.     Tenderness: There is no abdominal tenderness.  Musculoskeletal:        General: No tenderness. Normal range of motion.     Cervical back: Normal range of motion and neck supple.     Right lower leg: Edema (trace) present.     Left lower leg: Edema (trace) present.  Skin:    General: Skin is warm and dry.  Neurological:     Mental Status: She is alert and oriented to person, place, and time.     Cranial Nerves: No cranial nerve deficit.     Deep Tendon Reflexes: Reflexes are normal and symmetric.  Psychiatric:        Mood and Affect: Mood is anxious. Affect is tearful.        Behavior: Behavior normal.        Thought Content: Thought content normal.        Judgment: Judgment normal.       BP 129/78   Pulse 70   Temp 98.1 F (36.7 C) (Temporal)   Ht 5' 2 (1.575 m)   Wt 220 lb (99.8 kg)   SpO2 98%   BMI 40.24 kg/m      Assessment & Plan:  Elizabeth Brewer comes in today with chief complaint of Medical Management of Chronic Issues   Diagnosis and orders addressed:  1. Anxiety - busPIRone  (BUSPAR ) 7.5 MG tablet; Take 1 tablet (7.5 mg total) by mouth 3 (three) times daily as needed.  Dispense: 270 tablet; Refill: 2 - citalopram  (CELEXA ) 40 MG tablet; TAKE ONE (1) TABLET EACH DAY  Dispense: 90 tablet;  Refill: 1 - CMP14+EGFR  2. Grief - busPIRone  (BUSPAR ) 7.5 MG  tablet; Take 1 tablet (7.5 mg total) by mouth 3 (three) times daily as needed.  Dispense: 270 tablet; Refill: 2 - CMP14+EGFR  3. Benzodiazepine dependence (HCC) - citalopram  (CELEXA ) 40 MG tablet; TAKE ONE (1) TABLET EACH DAY  Dispense: 90 tablet; Refill: 1 - CMP14+EGFR  4. Controlled substance agreement signed - CMP14+EGFR  5. Chronic obstructive pulmonary disease, unspecified COPD type (HCC)  - fluticasone  furoate-vilanterol (BREO ELLIPTA ) 200-25 MCG/ACT AEPB; Inhale 1 puff into the lungs daily.  Dispense: 180 each; Refill: 2 - CMP14+EGFR  6. Essential hypertension - lisinopril  (ZESTRIL ) 40 MG tablet; Take 1 tablet (40 mg total) by mouth daily.  Dispense: 100 tablet; Refill: 0 - CMP14+EGFR  7. Hoarseness of voice  - lisinopril  (ZESTRIL ) 40 MG tablet; Take 1 tablet (40 mg total) by mouth daily.  Dispense: 100 tablet; Refill: 0 - CMP14+EGFR  8. Hypokalemia  - potassium chloride  SA (KLOR-CON  M) 20 MEQ tablet; Take 1 tablet (20 mEq total) by mouth 2 (two) times daily.  Dispense: 200 tablet; Refill: 0 - CMP14+EGFR  9. Aortic atherosclerosis (Primary) - rosuvastatin  (CRESTOR ) 20 MG tablet; Take 1 tablet (20 mg total) by mouth every evening.  Dispense: 100 tablet; Refill: 0 - CMP14+EGFR  10. Cervical myelopathy (HCC)  - CMP14+EGFR  11. Constipation, unspecified constipation type  - CMP14+EGFR  12. Type 2 diabetes mellitus with other specified complication, without long-term current use of insulin (HCC)  - Bayer DCA Hb A1c Waived - CMP14+EGFR  13. Moderate episode of recurrent major depressive disorder (HCC) - CMP14+EGFR  14. Gastroesophageal reflux disease, unspecified whether esophagitis present - CMP14+EGFR  Labs pending Pt will call insurance about getting deductible stretched throughout the year for the  Mounjaro  7.5 mg. Trulicity samples given today to make it until she can get rx.  Continue current  medications  Keep follow up with specialists  Health Maintenance reviewed Diet and exercise encouraged  Follow up plan: 3 months for chronic follow up   Bari Learn, FNP   "

## 2024-10-03 LAB — CMP14+EGFR
ALT: 9 [IU]/L (ref 0–32)
AST: 13 [IU]/L (ref 0–40)
Albumin: 4 g/dL (ref 3.9–4.9)
Alkaline Phosphatase: 90 [IU]/L (ref 49–135)
BUN/Creatinine Ratio: 12 (ref 12–28)
BUN: 11 mg/dL (ref 8–27)
Bilirubin Total: 0.3 mg/dL (ref 0.0–1.2)
CO2: 27 mmol/L (ref 20–29)
Calcium: 9.2 mg/dL (ref 8.7–10.3)
Chloride: 95 mmol/L — ABNORMAL LOW (ref 96–106)
Creatinine, Ser: 0.91 mg/dL (ref 0.57–1.00)
Globulin, Total: 3 g/dL (ref 1.5–4.5)
Glucose: 114 mg/dL — ABNORMAL HIGH (ref 70–99)
Potassium: 3 mmol/L — ABNORMAL LOW (ref 3.5–5.2)
Sodium: 138 mmol/L (ref 134–144)
Total Protein: 7 g/dL (ref 6.0–8.5)
eGFR: 71 mL/min/{1.73_m2}

## 2024-10-05 ENCOUNTER — Other Ambulatory Visit: Payer: Self-pay | Admitting: Family

## 2024-10-05 ENCOUNTER — Ambulatory Visit: Payer: Self-pay | Admitting: Family

## 2024-10-05 DIAGNOSIS — E876 Hypokalemia: Secondary | ICD-10-CM

## 2024-10-05 MED ORDER — POTASSIUM CHLORIDE CRYS ER 20 MEQ PO TBCR: 40.0000 meq | EXTENDED_RELEASE_TABLET | Freq: Two times a day (BID) | ORAL | 1 refills | Status: AC

## 2024-10-06 ENCOUNTER — Other Ambulatory Visit: Payer: Self-pay | Admitting: Family Medicine

## 2024-10-06 DIAGNOSIS — E876 Hypokalemia: Secondary | ICD-10-CM

## 2024-10-13 ENCOUNTER — Other Ambulatory Visit
